# Patient Record
Sex: Female | Born: 1995 | Race: White | Hispanic: No | Smoking: Former smoker
Health system: Southern US, Community
[De-identification: ages and names within clinical notes are randomized; demographics above are authoritative.]

## PROBLEM LIST (undated history)

## (undated) DIAGNOSIS — Z30431 Encounter for routine checking of intrauterine contraceptive device: Secondary | ICD-10-CM

## (undated) DIAGNOSIS — Z30430 Encounter for insertion of intrauterine contraceptive device: Secondary | ICD-10-CM

## (undated) DIAGNOSIS — F32A Depression, unspecified: Secondary | ICD-10-CM

## (undated) DIAGNOSIS — O039 Complete or unspecified spontaneous abortion without complication: Secondary | ICD-10-CM

## (undated) DIAGNOSIS — Z3041 Encounter for surveillance of contraceptive pills: Secondary | ICD-10-CM

## (undated) DIAGNOSIS — Z309 Encounter for contraceptive management, unspecified: Secondary | ICD-10-CM

## (undated) DIAGNOSIS — R42 Dizziness and giddiness: Secondary | ICD-10-CM

## (undated) DIAGNOSIS — Z8759 Personal history of other complications of pregnancy, childbirth and the puerperium: Secondary | ICD-10-CM

## (undated) DIAGNOSIS — Z3491 Encounter for supervision of normal pregnancy, unspecified, first trimester: Secondary | ICD-10-CM

## (undated) DIAGNOSIS — F988 Other specified behavioral and emotional disorders with onset usually occurring in childhood and adolescence: Secondary | ICD-10-CM

## (undated) DIAGNOSIS — F329 Major depressive disorder, single episode, unspecified: Secondary | ICD-10-CM

## (undated) DIAGNOSIS — B9689 Other specified bacterial agents as the cause of diseases classified elsewhere: Secondary | ICD-10-CM

## (undated) DIAGNOSIS — N926 Irregular menstruation, unspecified: Secondary | ICD-10-CM

## (undated) DIAGNOSIS — N760 Acute vaginitis: Principal | ICD-10-CM

## (undated) HISTORY — DX: Encounter for contraceptive management, unspecified: Z30.9

## (undated) HISTORY — DX: Encounter for insertion of intrauterine contraceptive device: Z30.430

## (undated) HISTORY — DX: Encounter for supervision of normal pregnancy, unspecified, first trimester: Z34.91

## (undated) HISTORY — DX: Encounter for surveillance of contraceptive pills: Z30.41

## (undated) HISTORY — DX: Other specified bacterial agents as the cause of diseases classified elsewhere: B96.89

## (undated) HISTORY — DX: Encounter for routine checking of intrauterine contraceptive device: Z30.431

## (undated) HISTORY — DX: Complete or unspecified spontaneous abortion without complication: O03.9

## (undated) HISTORY — DX: Acute vaginitis: N76.0

## (undated) HISTORY — DX: Irregular menstruation, unspecified: N92.6

## (undated) HISTORY — DX: Personal history of other complications of pregnancy, childbirth and the puerperium: Z87.59

## (undated) HISTORY — DX: Dizziness and giddiness: R42

## (undated) HISTORY — DX: Other specified behavioral and emotional disorders with onset usually occurring in childhood and adolescence: F98.8

## (undated) HISTORY — DX: Depression, unspecified: F32.A

## (undated) HISTORY — PX: COLONOSCOPY: SHX174

---
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
## 2013-01-19 ENCOUNTER — Ambulatory Visit: Payer: Medicaid Other | Admitting: Physician Assistant

## 2013-01-27 ENCOUNTER — Ambulatory Visit: Payer: Medicaid Other | Admitting: Physician Assistant

## 2013-02-01 ENCOUNTER — Encounter: Payer: Self-pay | Admitting: Physician Assistant

## 2013-02-01 ENCOUNTER — Ambulatory Visit (INDEPENDENT_AMBULATORY_CARE_PROVIDER_SITE_OTHER): Payer: Medicaid Other | Admitting: Physician Assistant

## 2013-02-01 VITALS — BP 114/70 | HR 84 | Temp 97.9°F | Resp 20

## 2013-02-01 DIAGNOSIS — J309 Allergic rhinitis, unspecified: Secondary | ICD-10-CM

## 2013-02-01 DIAGNOSIS — Z87898 Personal history of other specified conditions: Secondary | ICD-10-CM

## 2013-02-01 DIAGNOSIS — F988 Other specified behavioral and emotional disorders with onset usually occurring in childhood and adolescence: Secondary | ICD-10-CM

## 2013-02-01 DIAGNOSIS — K219 Gastro-esophageal reflux disease without esophagitis: Secondary | ICD-10-CM

## 2013-02-01 DIAGNOSIS — Z0000 Encounter for general adult medical examination without abnormal findings: Secondary | ICD-10-CM

## 2013-02-01 MED ORDER — OMEPRAZOLE 20 MG PO CPDR: 20.0000 mg | DELAYED_RELEASE_CAPSULE | Freq: Every day | ORAL | Status: DC

## 2013-02-01 NOTE — Progress Notes (Signed)
Patient ID: Nicole [name] MRN: [id number], DOB: 05/21/1996, 17 y.o. Date of Encounter: @DATE @  Chief Complaint:  Chief Complaint  Patient presents with  . new pt est care    HPI: 17 y.o. year old female  presents as new patient for WCC/CPE. Here with her mother who is deaf and uses sign language.  Saw Dr. Stephania Fragmin in past but here to establish care here. Also sees a Gyn for Gyn care. Has no real complaints today.Marland Kitchen   Has h/o mild GERD. Uses omeprazole prn. Does not need daily but in past other OTC meds did not relieve her symptoms. Uses this prn and it relieves her symtoms, works well.   H/O ADD: Was on med for this when in school. Graduated HS 2013. Not working, not in school. Says if she goes back to college, then will need to restart med but for now, she does not need med. Currently not employed either. Lives with her parents.   Uses Rx Meclizine prn "motion sickness" -Would use this prn- if riding on curvy roads, etc. Says this problem has gotten better as she has gotten older. Rarely needs the medication any more.   Seasonal Allergies: Uses OTC meds. This controls her symptoms.    Past Medical History  Diagnosis Date  . ADD (attention deficit disorder)   . Vertigo      Home Meds: See attached medication section for current medication list. Any medications entered into computer today will not appear on this note's list. The medications listed below were entered prior to today. Current Outpatient Prescriptions on File Prior to Visit  Medication Sig Dispense Refill  . levonorgestrel-ethinyl estradiol (LEVONEST) tablet Take 1 tablet by mouth daily.  1 Package  12   No current facility-administered medications on file prior to visit.    Allergies:  Allergies  Allergen Reactions  . Poison Ivy Extract (Extract Of Poison [place])     History   Social History  . Marital Status: Single    Spouse Name: N/A    Number of Children: N/A  . Years of Education: N/A   Occupational  History  . Not on file.   Social History Main Topics  . Smoking status: Former Smoker -- .2 years  . Smokeless tobacco: Never Used  . Alcohol Use: Yes     Comment: occ.  . Drug Use: No  . Sexually Active: Yes    Birth Control/ Protection: Pill   Other Topics Concern  . Not on file   Social History Narrative  . No narrative on file    Family History  Problem Relation Age of Onset  . Glaucoma Maternal Grandmother   . Hypertension Maternal Grandmother   . Deafness Mother   . Arthritis Mother      Review of Systems:  See HPI for pertinent ROS. All other ROS negative.    Physical Exam: Blood pressure 114/70, pulse 84, temperature 97.9 F (36.6 C), temperature source Oral, resp. rate 20, last menstrual period 01/11/2013., There is no weight on file to calculate BMI. General:WNWD WF.  Appears in no acute distress. Head: Normocephalic, atraumatic, eyes without discharge, sclera non-icteric, nares are without discharge. Bilateral auditory canals clear, TM's are without perforation, pearly grey and translucent with reflective cone of light bilaterally. Oral cavity moist, posterior pharynx without exudate, erythema, peritonsillar abscess, or post nasal drip.  Neck: Supple. No thyromegaly. No lymphadenopathy. Lungs: Clear bilaterally to auscultation without wheezes, rales, or rhonchi. Breathing is unlabored. Heart: RRR with S1  S2. No murmurs, rubs, or gallops. Abdomen: Soft, non-tender, non-distended with normoactive bowel sounds. No hepatomegaly. No rebound/guarding. No obvious abdominal masses. Musculoskeletal:  Strength and tone normal for age. Extremities/Skin: Warm and dry. No clubbing or cyanosis. No edema. No rashes or suspicious lesions. Neuro: Alert and oriented X 3. Moves all extremities spontaneously. Gait is normal. CNII-XII grossly in tact. Psych:  Responds to questions appropriately with a normal affect.     ASSESSMENT AND PLAN:  17 y.o. year old female with  1.  Visit for preventive health examination Immunizations UTD.  No labs indicated at this age. Asymptomatic.   2. GERD (gastroesophageal reflux disease) To cont med PRN. Avoid spicy foods, etc. Avoid eating, drinking 3 hours prior to going to bed.  - omeprazole (PRILOSEC) 20 MG capsule; Take 1 capsule (20 mg total) by mouth daily.  Dispense: 30 capsule; Refill: 11  3. Allergic rhinitis Cont OTC med prn.   4. ADD (attention deficit disorder) Does not need treatment now.   5. History of motion sickness Cont meclizine prn.   ROV 1 year, sooner if needed.   [address] [place], [place], [facility] 02/01/2013 12:14 PM

## 2013-03-04 ENCOUNTER — Ambulatory Visit (INDEPENDENT_AMBULATORY_CARE_PROVIDER_SITE_OTHER): Payer: Medicaid Other | Admitting: Family Medicine

## 2013-03-04 DIAGNOSIS — Z111 Encounter for screening for respiratory tuberculosis: Secondary | ICD-10-CM

## 2013-03-07 ENCOUNTER — Ambulatory Visit (INDEPENDENT_AMBULATORY_CARE_PROVIDER_SITE_OTHER): Payer: Medicaid Other | Admitting: *Deleted

## 2013-03-08 LAB — TB SKIN TEST
Induration: 0 mm
TB Skin Test: NEGATIVE

## 2013-03-09 ENCOUNTER — Ambulatory Visit: Payer: Medicaid Other | Admitting: Physician Assistant

## 2013-03-18 ENCOUNTER — Ambulatory Visit (INDEPENDENT_AMBULATORY_CARE_PROVIDER_SITE_OTHER): Payer: Medicaid Other | Admitting: Adult Health

## 2013-03-18 ENCOUNTER — Encounter: Payer: Self-pay | Admitting: Adult Health

## 2013-03-18 VITALS — BP 128/84 | Ht 67.0 in | Wt 187.5 lb

## 2013-03-18 DIAGNOSIS — A499 Bacterial infection, unspecified: Secondary | ICD-10-CM

## 2013-03-18 DIAGNOSIS — B9689 Other specified bacterial agents as the cause of diseases classified elsewhere: Secondary | ICD-10-CM

## 2013-03-18 DIAGNOSIS — N898 Other specified noninflammatory disorders of vagina: Secondary | ICD-10-CM

## 2013-03-18 DIAGNOSIS — N760 Acute vaginitis: Secondary | ICD-10-CM

## 2013-03-18 HISTORY — DX: Other specified bacterial agents as the cause of diseases classified elsewhere: B96.89

## 2013-03-18 HISTORY — DX: Other specified bacterial agents as the cause of diseases classified elsewhere: N76.0

## 2013-03-18 LAB — POCT WET PREP (WET MOUNT): Trichomonas Wet Prep HPF POC: NEGATIVE

## 2013-03-18 MED ORDER — METRONIDAZOLE 500 MG PO TABS: 500.0000 mg | ORAL_TABLET | Freq: Two times a day (BID) | ORAL | Status: DC

## 2013-03-18 NOTE — Patient Instructions (Addendum)
Bacterial Vaginosis Bacterial vaginosis (BV) is a vaginal infection where the normal balance of bacteria in the vagina is disrupted. The normal balance is then replaced by an overgrowth of certain bacteria. There are several different kinds of bacteria that can cause BV. BV is the most common vaginal infection in women of childbearing age. CAUSES   The cause of BV is not fully understood. BV develops when there is an increase or imbalance of harmful bacteria.  Some activities or behaviors can upset the normal balance of bacteria in the vagina and put women at increased risk including:  Having a new sex partner or multiple sex partners.  Douching.  Using an intrauterine device (IUD) for contraception.  It is not clear what role sexual activity plays in the development of BV. However, women that have never had sexual intercourse are rarely infected with BV. Women do not get BV from toilet seats, bedding, swimming pools or from touching objects around them.  SYMPTOMS   Grey vaginal discharge.  A fish-like odor with discharge, especially after sexual intercourse.  Itching or burning of the vagina and vulva.  Burning or pain with urination.  Some women have no signs or symptoms at all. DIAGNOSIS  Your caregiver must examine the vagina for signs of BV. Your caregiver will perform lab tests and look at the sample of vaginal fluid through a microscope. They will look for bacteria and abnormal cells (clue cells), a pH test higher than 4.5, and a positive amine test all associated with BV.  RISKS AND COMPLICATIONS   Pelvic inflammatory disease (PID).  Infections following gynecology surgery.  Developing HIV.  Developing herpes virus. TREATMENT  Sometimes BV will clear up without treatment. However, all women with symptoms of BV should be treated to avoid complications, especially if gynecology surgery is planned. Female partners generally do not need to be treated. However, BV may spread  between female sex partners so treatment is helpful in preventing a recurrence of BV.   BV may be treated with antibiotics. The antibiotics come in either pill or vaginal cream forms. Either can be used with nonpregnant or pregnant women, but the recommended dosages differ. These antibiotics are not harmful to the baby.  BV can recur after treatment. If this happens, a second round of antibiotics will often be prescribed.  Treatment is important for pregnant women. If not treated, BV can cause a premature delivery, especially for a pregnant woman who had a premature birth in the past. All pregnant women who have symptoms of BV should be checked and treated.  For chronic reoccurrence of BV, treatment with a type of prescribed gel vaginally twice a week is helpful. HOME CARE INSTRUCTIONS   Finish all medication as directed by your caregiver.  Do not have sex until treatment is completed.  Tell your sexual partner that you have a vaginal infection. They should see their caregiver and be treated if they have problems, such as a mild rash or itching.  Practice safe sex. Use condoms. Only have 1 sex partner. PREVENTION  Basic prevention steps can help reduce the risk of upsetting the natural balance of bacteria in the vagina and developing BV:  Do not have sexual intercourse (be abstinent).  Do not douche.  Use all of the medicine prescribed for treatment of BV, even if the signs and symptoms go away.  Tell your sex partner if you have BV. That way, they can be treated, if needed, to prevent reoccurrence. SEEK MEDICAL CARE IF:     Your symptoms are not improving after 3 days of treatment.  You have increased discharge, pain, or fever. MAKE SURE YOU:   Understand these instructions.  Will watch your condition.  Will get help right away if you are not doing well or get worse. FOR MORE INFORMATION  Division of STD Prevention (DSTDP), Centers for Disease Control and Prevention:  www.cdc.gov/std American Social Health Association (ASHA): www.ashastd.org  Document Released: 08/25/2005 Document Revised: 11/17/2011 Document Reviewed: 02/15/2009 ExitCare Patient Information 2014 [employer]. Take flagyl no alcohol  Follow up prn 

## 2013-03-18 NOTE — Progress Notes (Signed)
Subjective:     Patient ID: Nicole [name], female   DOB: 1996/02/20, 17 y.o.   MRN: [id number]  HPI Nicole [name] is in today complaining of feeling warm internally, it was better with her period.She had her urine checked at PCP and it was fine.She does have sex with condoms and she is on the pill.She may not start her period at the same place in the pack but they come each month.She takes the generic for Triphasil, discussed changing to monophasic pill if desired, if the period starting irregularly bothers her. Will continue the same for now.Mom with pt.  Review of Systems Positives in HPI Reviewed past medical,surgical, social and family history. Reviewed medications and allergies.     Objective:   Physical Exam BP 128/84  Ht 5\' 7"  (1.702 m)  Wt 187 lb 8 oz (85.049 kg)  BMI 29.36 kg/m2  LMP 03/10/2013   Skin warm and dry.Pelvic: external genitalia is normal in appearance, vagina: white discharge with slight odor, cervix:smooth with negative CMT, uterus: normal size, shape and contour, non tender, no masses felt, adnexa: no masses or tenderness noted. Wet prep: + for clue cells and rare +WBCs.  Assessment:      Vaginal discharge BV    Plan:      Rx flagyl 500 mg 1 bid x 7 days, no alcohol Follow up prn

## 2013-06-10 ENCOUNTER — Ambulatory Visit (INDEPENDENT_AMBULATORY_CARE_PROVIDER_SITE_OTHER): Payer: Medicaid Other | Admitting: Family Medicine

## 2013-06-10 DIAGNOSIS — Z23 Encounter for immunization: Secondary | ICD-10-CM

## 2013-06-30 ENCOUNTER — Encounter: Payer: Self-pay | Admitting: Physician Assistant

## 2013-06-30 ENCOUNTER — Ambulatory Visit (INDEPENDENT_AMBULATORY_CARE_PROVIDER_SITE_OTHER): Payer: Medicaid Other | Admitting: Physician Assistant

## 2013-06-30 VITALS — BP 120/90 | HR 88 | Temp 98.6°F | Resp 18 | Wt 193.0 lb

## 2013-06-30 DIAGNOSIS — R5383 Other fatigue: Secondary | ICD-10-CM

## 2013-06-30 DIAGNOSIS — N926 Irregular menstruation, unspecified: Secondary | ICD-10-CM

## 2013-06-30 DIAGNOSIS — R109 Unspecified abdominal pain: Secondary | ICD-10-CM

## 2013-06-30 DIAGNOSIS — R350 Frequency of micturition: Secondary | ICD-10-CM

## 2013-06-30 DIAGNOSIS — R5381 Other malaise: Secondary | ICD-10-CM

## 2013-06-30 DIAGNOSIS — R635 Abnormal weight gain: Secondary | ICD-10-CM

## 2013-06-30 LAB — COMPLETE METABOLIC PANEL WITHOUT GFR
ALT: 17 U/L (ref 0–35)
AST: 15 U/L (ref 0–37)
Albumin: 4.1 g/dL (ref 3.5–5.2)
Alkaline Phosphatase: 74 U/L (ref 39–117)
BUN: 10 mg/dL (ref 6–23)
CO2: 23 meq/L (ref 19–32)
Calcium: 9.6 mg/dL (ref 8.4–10.5)
Chloride: 105 meq/L (ref 96–112)
Creat: 0.73 mg/dL (ref 0.50–1.10)
GFR, Est African American: >89 mL/min
GFR, Est Non African American: >89 mL/min
Glucose, Bld: 94 mg/dL (ref 70–99)
Potassium: 5 meq/L (ref 3.5–5.3)
Sodium: 140 meq/L (ref 135–145)
Total Bilirubin: 0.2 mg/dL — ABNORMAL LOW (ref 0.3–1.2)
Total Protein: 6.7 g/dL (ref 6.0–8.3)

## 2013-06-30 LAB — CBC WITH DIFFERENTIAL/PLATELET
Basophils Relative: 0 % (ref 0–1)
Eosinophils Absolute: 0.1 10*3/uL (ref 0.0–0.7)
Eosinophils Relative: 1 % (ref 0–5)
HCT: 39.4 % (ref 36.0–46.0)
Hemoglobin: 13.2 g/dL (ref 12.0–15.0)
Lymphocytes Relative: 41 % (ref 12–46)
Lymphs Abs: 3.3 10*3/uL (ref 0.7–4.0)
MCH: 28.9 pg (ref 26.0–34.0)
MCV: 86.2 fL (ref 78.0–100.0)
Monocytes Absolute: 0.6 10*3/uL (ref 0.1–1.0)
Monocytes Relative: 7 % (ref 3–12)
Neutro Abs: 4 10*3/uL (ref 1.7–7.7)
Neutrophils Relative %: 51 % (ref 43–77)
Platelets: 239 10*3/uL (ref 150–400)
RBC: 4.57 MIL/uL (ref 3.87–5.11)
WBC: 8 10*3/uL (ref 4.0–10.5)

## 2013-06-30 LAB — TSH: TSH: 1.738 u[IU]/mL (ref 0.350–4.500)

## 2013-06-30 LAB — URINALYSIS, MICROSCOPIC ONLY: Crystals: NONE SEEN

## 2013-06-30 LAB — URINALYSIS, ROUTINE W REFLEX MICROSCOPIC
Glucose, UA: NEGATIVE mg/dL
Ketones, ur: NEGATIVE mg/dL
Leukocytes, UA: NEGATIVE
Nitrite: NEGATIVE
Protein, ur: NEGATIVE mg/dL
Specific Gravity, Urine: >1.03 — ABNORMAL HIGH (ref 1.005–1.030)
Urobilinogen, UA: 0.2 mg/dL (ref 0.0–1.0)
pH: 6 (ref 5.0–8.0)

## 2013-06-30 LAB — PREGNANCY, URINE: Preg Test, Ur: NEGATIVE

## 2013-06-30 NOTE — Progress Notes (Signed)
Patient ID: Nicole [name] MRN: [id number], DOB: 08-06-1996, 17 y.o. Date of Encounter: @DATE @  Chief Complaint:  Chief Complaint  Patient presents with  . c/o stomach pains    across mid section, spotting, urinary freq, ? constipation, wt gain    HPI: 17 y.o. year old white female  presents with the following complaints:  She states that she is on birth control pills. States that she occasionally does accidentally skipped a pill. Says that one month ago she had a day where she saw light spotting just one time when she wiped when using the bathroom. She did understand why she had that spotting. She did not think she had missed any pills around that time. Now says that with this current  Cycle, she still hasn't started to really bleed. Says that with her pills she usually starts her period on that Wednesday. She should have started yesterday. However today is Thursday and she still is there it having very light spotting. Says that her periods usually start off very heavy. She is wondering why this is different. She does report that she missed taking 2 pills during this pack.  She also reports that she has diffuse pains across her entire abdomen all the time. She does not think she has problems with constipation. She says that she really does not have to strain to have a bowel movement and says that her BMs really are not overly hard.  As well she says that she recently feels tired and sleepy all the time. As well she is frustrated because she is having weight gain. Says that she is needing the same doing the same amount of activity but is gaining weight and she does understand this.  She later tells me that she has arty seen her GYN regarding some of these issues. Says that she was told she had a "slight hormone imbalance". Was prescribed medication but says that it caused her to be sick on her stomach and vomit so she has stopped that medication.   Past Medical History  Diagnosis Date  .  ADD (attention deficit disorder)   . Vertigo   . BV (bacterial vaginosis) 03/18/2013    Treat ed with flagyl     Home Meds: See attached medication section for current medication list. Any medications entered into computer today will not appear on this note's list. The medications listed below were entered prior to today. Current Outpatient Prescriptions on File Prior to Visit  Medication Sig Dispense Refill  . Aspirin-Acetaminophen-Caffeine (EXCEDRIN MIGRAINE PO) Take by mouth as needed.      . cetirizine (ZYRTEC) 10 MG tablet Take 10 mg by mouth at bedtime.      Marland Kitchen ibuprofen (ADVIL,MOTRIN) 200 MG tablet Take 200 mg by mouth as needed for pain.      Marland Kitchen levonorgestrel-ethinyl estradiol (LEVONEST) tablet Take 1 tablet by mouth daily.  1 Package  12  . meclizine (ANTIVERT) 25 MG tablet Take 25 mg by mouth 3 (three) times daily as needed.      . Multiple Vitamin (MULTIVITAMIN) tablet Take 1 tablet by mouth daily.      Marland Kitchen amphetamine-dextroamphetamine (ADDERALL XR) 25 MG 24 hr capsule Take 25 mg by mouth every morning. Takes only when in school      . omeprazole (PRILOSEC) 20 MG capsule Take 1 capsule (20 mg total) by mouth daily.  30 capsule  11   No current facility-administered medications on file prior to visit.    Allergies:  Allergies  Allergen Reactions  . Poison Ivy Extract [employer] Of Poison Ivy]     History   Social History  . Marital Status: Single    Spouse Name: N/A    Number of Children: N/A  . Years of Education: N/A   Occupational History  . Not on file.   Social History Main Topics  . Smoking status: Former Smoker -- .2 years    Types: Cigarettes  . Smokeless tobacco: Never Used  . Alcohol Use: Yes     Comment: occ.  . Drug Use: No  . Sexual Activity: Yes    Birth Control/ Protection: Pill   Other Topics Concern  . Not on file   Social History Narrative  . No narrative on file    Family History  Problem Relation Age of Onset  . Glaucoma Maternal  Grandmother   . Hypertension Maternal Grandmother   . Deafness Mother   . Arthritis Mother      Review of Systems:  See HPI for pertinent ROS. All other ROS negative.    Physical Exam: Blood pressure 120/90, pulse 88, temperature 98.6 F (37 C), temperature source Oral, resp. rate 18, weight 193 lb (87.544 kg)., Body mass index is 30.22 kg/(m^2). General: Mild obese white female .Appears in no acute distress. Neck: Supple. No thyromegaly. No lymphadenopathy. Lungs: Clear bilaterally to auscultation without wheezes, rales, or rhonchi. Breathing is unlabored. Heart: RRR with S1 S2. No murmurs, rubs, or gallops. Abdomen: Soft, non-tender, non-distended with normoactive bowel sounds. No hepatomegaly. No rebound/guarding. No obvious abdominal masses. She reports very mild discomfort with palpation around the peri-Umbilical area. Says "that is always sore there." No other area of tenderness with firm palpation throughout. Musculoskeletal:  Strength and tone normal for age. Extremities/Skin: Warm and dry. No clubbing or cyanosis. No edema. No rashes or suspicious lesions. Neuro: Alert and oriented X 3. Moves all extremities spontaneously. Gait is normal. CNII-XII grossly in tact. Psych:  Responds to questions appropriately with a normal affect.   Results for orders placed in visit on 06/30/13  URINALYSIS, ROUTINE W REFLEX MICROSCOPIC      Result Value Range   Color, Urine YELLOW  YELLOW   APPearance CLEAR  CLEAR   Specific Gravity, Urine >1.030 (*) 1.005 - 1.030   pH 6.0  5.0 - 8.0   Glucose, UA NEG  NEG mg/dL   Bilirubin Urine SMALL (*) NEG   Ketones, ur NEG  NEG mg/dL   Hgb urine dipstick LARGE (*) NEG   Protein, ur NEG  NEG mg/dL   Urobilinogen, UA 0.2  0.0 - 1.0 mg/dL   Nitrite NEG  NEG   Leukocytes, UA NEG  NEG  PREGNANCY, URINE      Result Value Range   Preg Test, Ur NEG    URINALYSIS, MICROSCOPIC ONLY      Result Value Range   Squamous Epithelial / LPF FEW  RARE   Crystals  NONE SEEN  NONE SEEN   Casts NONE SEEN  NONE SEEN   WBC, UA 0-2  <3 WBC/hpf   RBC / HPF 0-2  <3 RBC/hpf   Bacteria, UA MANY (*) RARE   Urine-Other SM MUCUS       ASSESSMENT AND PLAN:  17 y.o. year old female with  1. Fatigue - CBC with Differential - COMPLETE METABOLIC PANEL WITH GFR - TSH  2. Weight gain - TSH  3. Abdominal pain, unspecified site  4. Frequency of urination - Urinalysis, Routine w reflex  microscopic - Pregnancy, urine  5. Irregular menses - Urinalysis, Routine w reflex microscopic - Pregnancy, urine - TSH  I think some of her issues are secondary to missing some of her birth control pills. Then causing  imbalance in hormones. Her diffuse abdominal pain it seems secondary to possible constipation.  If her thyroid and labs are normal her fatigue may be secondary to lack of exercise. If labs are normal then I would recommend improvement in her diet and increase exercise and compliance with birth control pills.  Signed, [address] [place], [place], [facility] 06/30/2013 10:26 AM

## 2013-07-04 ENCOUNTER — Telehealth: Payer: Self-pay | Admitting: Physician Assistant

## 2013-07-04 NOTE — Telephone Encounter (Signed)
Patient is calling for her results on her labs.

## 2013-07-04 NOTE — Telephone Encounter (Signed)
Pt was called see result notes.

## 2013-07-27 ENCOUNTER — Ambulatory Visit (INDEPENDENT_AMBULATORY_CARE_PROVIDER_SITE_OTHER): Payer: Medicaid Other | Admitting: Adult Health

## 2013-07-27 ENCOUNTER — Encounter: Payer: Self-pay | Admitting: Adult Health

## 2013-07-27 ENCOUNTER — Other Ambulatory Visit (HOSPITAL_COMMUNITY)
Admission: RE | Admit: 2013-07-27 | Discharge: 2013-07-27 | Disposition: A | Discharge status: Home / Self Care | Payer: Medicaid Other | Source: Ambulatory Visit | Attending: Adult Health | Admitting: Adult Health

## 2013-07-27 VITALS — BP 110/78 | HR 78 | Ht 65.5 in | Wt 194.0 lb

## 2013-07-27 DIAGNOSIS — Z01419 Encounter for gynecological examination (general) (routine) without abnormal findings: Secondary | ICD-10-CM | POA: Insufficient documentation

## 2013-07-27 DIAGNOSIS — Z113 Encounter for screening for infections with a predominantly sexual mode of transmission: Secondary | ICD-10-CM | POA: Insufficient documentation

## 2013-07-27 DIAGNOSIS — Z3049 Encounter for surveillance of other contraceptives: Secondary | ICD-10-CM

## 2013-07-27 DIAGNOSIS — Z309 Encounter for contraceptive management, unspecified: Secondary | ICD-10-CM

## 2013-07-27 LAB — RPR

## 2013-07-27 MED ORDER — NORGESTIMATE-ETH ESTRADIOL 0.25-35 MG-MCG PO TABS: 1.0000 | ORAL_TABLET | Freq: Every day | ORAL | Status: DC

## 2013-07-27 NOTE — Patient Instructions (Signed)
Physical in 1 year Finish current pack of pill and start sprintec Use condoms Follow labs in 48 hours

## 2013-07-27 NOTE — Progress Notes (Signed)
Patient ID: Nicole [name], female   DOB: 1996-04-23, 17 y.o.   MRN: [id number] History of Present Illness: Nicole [name] is a 17 year old white female in to discuss changing OCs, but has family planning medicaid and has to have physical and pap first.She has had some low pelvic pain at times, she was seen at PCP and treated for constipation, but still has about 3 weeks before period starts.   Current Medications, Allergies, Past Medical History, Past Surgical History, Family History and Social History were reviewed in [employer] record.     Review of Systems: Patient denies any headaches, blurred vision, shortness of breath, chest pain,  problems with bowel movements, urination, or intercourse. No joint pain or mood changes, see HPI for positives.    Physical Exam:BP 110/78  Pulse 78  Ht 5' 5.5" (1.664 m)  Wt 194 lb (87.998 kg)  BMI 31.78 kg/m2  LMP 06/26/2013 General:  Well developed, well nourished, no acute distress Skin:  Warm and dry Neck:  Midline trachea, normal thyroid Lungs; Clear to auscultation bilaterally Breast:  No dominant palpable mass, retraction, or nipple discharge Cardiovascular: Regular rate and rhythm Abdomen:  Soft, non tender, no hepatosplenomegaly Pelvic:  External genitalia is normal in appearance.  The vagina is normal in appearance, scant white normal appearing discharge without odor.. The cervix is smooth and pap performed with GC/CHL.Marland Kitchen  Uterus is felt to be normal size, shape, and contour.  Noadnexal masses, mild tenderness noted RLQ. Extremities:  No swelling or varicosities noted Psych:  No mood changes, alert and cooperative, seems happy   Impression: Yearly gyn exam-family planning medicaid Contraceptive management STD screening   Plan: Finish current pills and then start sprintec,Rx sprintec disp 1 pack refill x 11 Use condoms Follow up labs in 48 hours Physical in 1 year Call if not better after 3 months of sprintec Check  HIV,RPR,HSV2, HEPT B&C

## 2013-07-28 LAB — HEPATITIS B SURFACE ANTIGEN: Hepatitis B Surface Ag: NEGATIVE

## 2013-07-28 LAB — HSV 2 ANTIBODY, IGG: HSV 2 Glycoprotein G Ab, IgG: 0.3 IV

## 2013-07-28 LAB — HIV ANTIBODY (ROUTINE TESTING W REFLEX): HIV: NONREACTIVE

## 2013-07-28 LAB — HEPATITIS C ANTIBODY: HCV Ab: NEGATIVE

## 2013-10-26 ENCOUNTER — Ambulatory Visit (INDEPENDENT_AMBULATORY_CARE_PROVIDER_SITE_OTHER): Payer: Medicaid Other | Admitting: Adult Health

## 2013-10-26 ENCOUNTER — Encounter: Payer: Self-pay | Admitting: Adult Health

## 2013-10-26 VITALS — BP 118/78 | Ht 67.0 in | Wt 187.0 lb

## 2013-10-26 DIAGNOSIS — Z3049 Encounter for surveillance of other contraceptives: Secondary | ICD-10-CM

## 2013-10-26 DIAGNOSIS — Z309 Encounter for contraceptive management, unspecified: Secondary | ICD-10-CM

## 2013-10-26 NOTE — Progress Notes (Signed)
Subjective:     Patient ID: Loletta ParishKatie M Allen, female   DOB: [birth year], 18 y.o.   MRN: [id number]  HPI Florentina AddisonKatie is a 18 year old white female in complaining of having burning like a cut with last period none now and the period was pink then dark brown then red, and she missed starting back on sprintec due to weather, could not get to drug store.  Review of Systems See HPI Reviewed past medical,surgical, social and family history. Reviewed medications and allergies.     Objective:   Physical Exam BP 118/78  Ht 5\' 7"  (1.702 m)  Wt 187 lb (84.823 kg)  BMI 29.28 kg/m2  LMP 10/11/2013   see HPI. Discussed with pt that the period could be different colors, but not sure about the burn to call me if it happens again, could be from finger nail cut or tampon, but since none today will not do exam.  Assessment:       Contraceptive management Plan:     Start pills back today Call if any more burning Use condoms Review handout on contraceptive use

## 2013-10-26 NOTE — Patient Instructions (Signed)
Take pill today and use condoms, call if any more burning Oral Contraception Use Oral contraceptive pills (OCPs) are medicines taken to prevent pregnancy. OCPs work by preventing the ovaries from releasing eggs. The hormones in OCPs also cause the cervical mucus to thicken, preventing the sperm from entering the uterus. The hormones also cause the uterine lining to become thin, not allowing a fertilized egg to attach to the inside of the uterus. OCPs are highly effective when taken exactly as prescribed. However, OCPs do not prevent sexually transmitted diseases (STDs). Safe sex practices, such as using condoms along with an OCP, can help prevent STDs. Before taking OCPs, you may have a physical exam and Pap test. Your health care provider may also order blood tests if necessary. Your health care provider will make sure you are a good candidate for oral contraception. Discuss with your health care provider the possible side effects of the OCP you may be prescribed. When starting an OCP, it can take 2 to 3 months for the body to adjust to the changes in hormone levels in your body.  HOW TO TAKE ORAL CONTRACEPTIVE PILLS Your health care provider may advise you on how to start taking the first cycle of OCPs. Otherwise, you can:   Start on day 1 of your menstrual period. You will not need any backup contraceptive protection with this start time.   Start on the first Sunday after your menstrual period or the day you get your prescription. In these cases, you will need to use backup contraceptive protection for the first week.   Start the pill at any time of your cycle. If you take the pill within 5 days of the start of your period, you are protected against pregnancy right away. In this case, you will not need a backup form of birth control. If you start at any other time of your menstrual cycle, you will need to use another form of birth control for 7 days. If your OCP is the type called a minipill, it will  protect you from pregnancy after taking it for 2 days (48 hours). After you have started taking OCPs:   If you forget to take 1 pill, take it as soon as you remember. Take the next pill at the regular time.   If you miss 2 or more pills, call your health care provider because different pills have different instructions for missed doses. Use backup birth control until your next menstrual period starts.   If you use a 28-day pack that contains inactive pills and you miss 1 of the last 7 pills (pills with no hormones), it will not matter. Throw away the rest of the nonhormone pills and start a new pill pack.  No matter which day you start the OCP, you will always start a new pack on that same day of the week. Have an extra pack of OCPs and a backup contraceptive method available in case you miss some pills or lose your OCP pack.  HOME CARE INSTRUCTIONS   Do not smoke.   Always use a condom to protect against STDs. OCPs do not protect against STDs.   Use a calendar to mark your menstrual period days.   Read the information and directions that came with your OCP. Talk to your health care provider if you have questions.  SEEK MEDICAL CARE IF:   You develop nausea and vomiting.   You have abnormal vaginal discharge or bleeding.   You develop a rash.  You miss your menstrual period.   You are losing your hair.   You need treatment for mood swings or depression.   You get dizzy when taking the OCP.   You develop acne from taking the OCP.   You become pregnant.  SEEK IMMEDIATE MEDICAL CARE IF:   You develop chest pain.   You develop shortness of breath.   You have an uncontrolled or severe headache.   You develop numbness or slurred speech.   You develop visual problems.   You develop pain, redness, and swelling in the legs.  Document Released: 08/14/2011 Document Revised: 04/27/2013 Document Reviewed: 02/13/2013 [facility] Patient Information 2014  [place], [place].

## 2013-11-28 ENCOUNTER — Telehealth: Payer: Self-pay | Admitting: Family Medicine

## 2013-11-28 NOTE — Telephone Encounter (Signed)
rec'd PA request from pharmacy for Omeprazole.  I called patient, she states she only uses this when needed. Some times may only take once a month.  Will try to get PA from Medicaid.  They do not usually approve this when patient takes daily but may for prn use.

## 2013-11-30 NOTE — Telephone Encounter (Signed)
[facility]razole.  [facility] [facility]  Approval # [id number]  Approved for 1 year 11/28/13-11/28/14.  Confirmation [id number]

## 2014-07-21 ENCOUNTER — Ambulatory Visit (INDEPENDENT_AMBULATORY_CARE_PROVIDER_SITE_OTHER): Payer: Medicaid Other | Admitting: *Deleted

## 2014-07-21 DIAGNOSIS — Z23 Encounter for immunization: Secondary | ICD-10-CM

## 2014-08-10 ENCOUNTER — Other Ambulatory Visit: Payer: Self-pay | Admitting: Adult Health

## 2015-02-23 ENCOUNTER — Encounter: Payer: Self-pay | Admitting: Family Medicine

## 2015-02-23 ENCOUNTER — Ambulatory Visit (INDEPENDENT_AMBULATORY_CARE_PROVIDER_SITE_OTHER): Payer: BLUE CROSS/BLUE SHIELD | Admitting: Family Medicine

## 2015-02-23 VITALS — BP 122/64 | HR 78 | Temp 98.2°F | Resp 14 | Ht 66.5 in | Wt 169.0 lb

## 2015-02-23 DIAGNOSIS — F988 Other specified behavioral and emotional disorders with onset usually occurring in childhood and adolescence: Secondary | ICD-10-CM

## 2015-02-23 DIAGNOSIS — F909 Attention-deficit hyperactivity disorder, unspecified type: Secondary | ICD-10-CM

## 2015-02-23 NOTE — Patient Instructions (Signed)
Continue current medications I will write a letter  F/U as needed

## 2015-02-25 NOTE — Progress Notes (Signed)
Patient ID: Nicole [name], female   DOB: 1996/07/20, 19 y.o.   MRN: [id number]   Subjective:    Patient ID: Nicole [name], female    DOB: [birth year], 19 y.o.   MRN: [id number]  Patient presents for [employer]  Pt here for Clearance for the [employer]. She has history of ADHD and this was flagged on her application. She requires a letter stating her current ADHD status. Her mother is here who is deaf and uses ASL- Lyndsi is interperting. Diagnosed with ADHD age, 3 was evaluated by her pediatrician at that time and started on Medications. No history of any other mental illness such as anxiety, depression or bipolar disorder. She was last on Vyvanse in [employer]. She stopped medication her senior year. When she graduated she was off medication. She then entered [employer] school and then went on to [employer] and completed both courses without any medications. 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 She has not had any overactive behavioral issues since her young childhood.     Review Of Systems:  GEN- denies fatigue, fever, weight loss,weakness, recent illness HEENT- denies eye drainage, change in vision, nasal discharge, CVS- denies chest pain, palpitations RESP- denies SOB, cough, wheeze ABD- denies N/V, change in stools, abd pain GU- denies dysuria, hematuria, dribbling, incontinence MSK- denies joint pain, muscle aches, injury Neuro- denies headache, dizziness, syncope, seizure activity       Objective:    BP 122/64 mmHg  Pulse 78  Temp(Src) 98.2 F (36.8 C) (Oral)  Resp 14  Ht 5' 6.5" (1.689 m)  Wt 169 lb (76.658 kg)  BMI 26.87 kg/m2  LMP 02/20/2015 (Approximate) GEN- NAD, alert and oriented x3 CVS- RRR, no murmur RESP-CTAB Psych- normal affect and mood, normal speech, normal thought process         Assessment & Plan:      Problem List Items Addressed This Visit    ADD  (attention deficit disorder) - Primary    History of ADHD asymptomatic for a few years now, off medications for 3 years. She has been able to complete college education and hold a job without any difficulty. She is cleared to participate in any [employer] and does not require any medication. Letter will be written on behalf to her Biomedical engineer.          Note: This dictation was prepared with Dragon dictation along with smaller phrase technology. Any transcriptional errors that result from this process are unintentional.

## 2015-02-25 NOTE — Assessment & Plan Note (Signed)
History of ADHD asymptomatic for a few years now, off medications for 3 years. She has been able to complete college education and hold a job without any difficulty. She is cleared to participate in any [employer] and does not require any medication. Letter will be written on behalf to her Biomedical engineer.

## 2015-02-26 ENCOUNTER — Encounter: Payer: Self-pay | Admitting: Family Medicine

## 2015-02-27 ENCOUNTER — Encounter: Payer: Self-pay | Admitting: Family Medicine

## 2015-02-27 ENCOUNTER — Ambulatory Visit (INDEPENDENT_AMBULATORY_CARE_PROVIDER_SITE_OTHER): Payer: BLUE CROSS/BLUE SHIELD | Admitting: Family Medicine

## 2015-02-27 VITALS — BP 122/66 | HR 70 | Temp 98.5°F | Resp 16 | Ht 66.5 in | Wt 169.0 lb

## 2015-02-27 DIAGNOSIS — R42 Dizziness and giddiness: Secondary | ICD-10-CM | POA: Diagnosis not present

## 2015-02-27 DIAGNOSIS — J0100 Acute maxillary sinusitis, unspecified: Secondary | ICD-10-CM | POA: Diagnosis not present

## 2015-02-27 MED ORDER — AMOXICILLIN 875 MG PO TABS: 875.0000 mg | ORAL_TABLET | Freq: Two times a day (BID) | ORAL | Status: DC

## 2015-02-27 NOTE — Patient Instructions (Signed)
Take zyrtec as prescribed Antibiotics Fluids We will call with lab results

## 2015-02-27 NOTE — Progress Notes (Signed)
Patient ID: Nicole [name], female   DOB: 17-Aug-1996, 19 y.o.   MRN: [id number]   Subjective:    Patient ID: Nicole [name], female    DOB: 22-Jan-1996, 19 y.o.   MRN: [id number]  Patient presents for Dizziness  patient with dizziness and sinus pressure E or popping for the past couple weeks. She's also had some mild cough and congestion. She did not mention this last week and she thought it would go away but when she was exercising the past 2 days she felt dizzy and felt like she was going to vomit. she has not had any emesis. She states the symptoms started after she was cleaning out some moles in her office. She uses bleach without using the mask. She also has seasonal allergies and has not been taking her Zyrtec.  She does have history of some vertigo-like symptoms which she has had on and off in the past and she does use meclizine as needed    Review Of Systems: per above   GEN- denies fatigue, fever, weight loss,weakness, recent illness HEENT- denies eye drainage, change in vision, nasal discharge, CVS- denies chest pain, palpitations RESP- denies SOB, cough, wheeze ABD- denies N/V, change in stools, abd pain GU- denies dysuria, hematuria, dribbling, incontinence MSK- denies joint pain, muscle aches, injury Neuro- denies headache, +dizziness, syncope, seizure activity       Objective:    BP 122/66 mmHg  Pulse 70  Temp(Src) 98.5 F (36.9 C) (Oral)  Resp 16  Ht 5' 6.5" (1.689 m)  Wt 169 lb (76.658 kg)  BMI 26.87 kg/m2  LMP 02/20/2015 (Approximate) GEN- NAD, alert and oriented x3 HEENT- PERRL, EOMI, non injected sclera, pink conjunctiva, MMM, oropharynx clear, TTP left maxillary sinus, clear fluid in left TM, canal clear bilat Neck- Supple, no thyromegaly, no LAD CVS- RRR, no murmur RESP-CTAB Neuro-CNII-XII intact no focal deficits EXT- No edema Pulses- Radial - 2+        Assessment & Plan:      Problem List Items Addressed This Visit    None    Visit Diagnoses     Dizziness and giddiness    -  Primary    Relevant Orders    CBC with Differential/Platelet    Comprehensive metabolic panel    TSH    Acute maxillary sinusitis, recurrence not specified        cover sinusitis, given amox, restart zyrtec. Push fluids, hold off one exercise a couple of days, mold/bleach exposure could also cause some symptoms. Will check basic labs as hopefully leaving for military as well soon    Relevant Medications    amoxicillin (AMOXIL) 875 MG tablet       Note: This dictation was prepared with Dragon dictation along with smaller phrase technology. Any transcriptional errors that result from this process are unintentional.

## 2015-02-28 LAB — COMPREHENSIVE METABOLIC PANEL
ALK PHOS: 61 U/L (ref 39–117)
ALT: 11 U/L (ref 0–35)
AST: 14 U/L (ref 0–37)
Albumin: 3.6 g/dL (ref 3.5–5.2)
BUN: 12 mg/dL (ref 6–23)
CO2: 25 mEq/L (ref 19–32)
Calcium: 9.5 mg/dL (ref 8.4–10.5)
Chloride: 105 mEq/L (ref 96–112)
Creat: 0.77 mg/dL (ref 0.50–1.10)
Glucose, Bld: 77 mg/dL (ref 70–99)
Potassium: 5 mEq/L (ref 3.5–5.3)
Sodium: 140 mEq/L (ref 135–145)
Total Bilirubin: 0.4 mg/dL (ref 0.2–1.2)
Total Protein: 6.3 g/dL (ref 6.0–8.3)

## 2015-02-28 LAB — CBC WITH DIFFERENTIAL/PLATELET
Basophils Absolute: 0 10*3/uL (ref 0.0–0.1)
Basophils Relative: 0 % (ref 0–1)
EOS PCT: 1 % (ref 0–5)
Eosinophils Absolute: 0.1 10*3/uL (ref 0.0–0.7)
HCT: 40.9 % (ref 36.0–46.0)
Hemoglobin: 13.3 g/dL (ref 12.0–15.0)
Lymphocytes Relative: 26 % (ref 12–46)
Lymphs Abs: 2.2 10*3/uL (ref 0.7–4.0)
MCH: 29 pg (ref 26.0–34.0)
MCHC: 32.5 g/dL (ref 30.0–36.0)
MCV: 89.3 fL (ref 78.0–100.0)
MPV: 11.4 fL (ref 8.6–12.4)
Monocytes Absolute: 0.5 10*3/uL (ref 0.1–1.0)
Monocytes Relative: 6 % (ref 3–12)
Neutro Abs: 5.7 10*3/uL (ref 1.7–7.7)
Neutrophils Relative %: 67 % (ref 43–77)
PLATELETS: 224 10*3/uL (ref 150–400)
RBC: 4.58 MIL/uL (ref 3.87–5.11)
RDW: 13.5 % (ref 11.5–15.5)
WBC: 8.5 10*3/uL (ref 4.0–10.5)

## 2015-02-28 LAB — TSH: TSH: 0.61 u[IU]/mL (ref 0.350–4.500)

## 2015-03-02 ENCOUNTER — Telehealth: Payer: Self-pay | Admitting: Family Medicine

## 2015-03-02 ENCOUNTER — Encounter: Payer: Self-pay | Admitting: Family Medicine

## 2015-03-02 ENCOUNTER — Encounter: Payer: Self-pay | Admitting: *Deleted

## 2015-03-02 NOTE — Telephone Encounter (Signed)
Letter done, pt can pick up

## 2015-03-02 NOTE — Telephone Encounter (Signed)
Pt took letter you wrote to [employer].  They said she needs another one but it can not mention the military in the letter.  Would like to pick up Monday.

## 2015-03-05 NOTE — Telephone Encounter (Signed)
Called placed to pt to make aware that letter is up front to be picked up.

## 2015-03-19 ENCOUNTER — Telehealth: Payer: Self-pay | Admitting: Family Medicine

## 2015-03-19 NOTE — Telephone Encounter (Signed)
Again needs new letter for [employer].  Needs to say ADD not ADHD.  Last sentence needs to say  "She does not require any medication and is released from my care"  Understands you will not be back in the office until 7/18.

## 2015-03-26 NOTE — Telephone Encounter (Signed)
Call pt back, as a child the diagnosis was ADHD which is correct, see what it the issue is. Also she is still a patient here therefore is NOT released from my care, if she has the number for who ever this Biomedical engineeravy recruiter is that may be better

## 2015-03-28 ENCOUNTER — Encounter: Payer: Self-pay | Admitting: *Deleted

## 2015-03-28 NOTE — Telephone Encounter (Signed)
Call placed to recruiter to verify.   Letter revised per recruiter's recommendation.   Letter faxed.

## 2015-03-28 NOTE — Telephone Encounter (Signed)
Received call from patient.   Reports that she was contacted by Glade LloydJackie Phillips, [employer] recruiter and was advised taht the Dx of ADHD was correct. Also advised that the last sentence of letter should state that she does not need any medications for ADHD and is released from ca re in regards to ADHD.   Contact information for Recruiter is [phone].

## 2015-03-28 NOTE — Telephone Encounter (Signed)
Okay , can you verify this with the recruiter, and okay to revise the letter again  You can fax to them

## 2015-05-29 ENCOUNTER — Ambulatory Visit (INDEPENDENT_AMBULATORY_CARE_PROVIDER_SITE_OTHER): Payer: BLUE CROSS/BLUE SHIELD | Admitting: Adult Health

## 2015-05-29 ENCOUNTER — Encounter: Payer: Self-pay | Admitting: Adult Health

## 2015-05-29 VITALS — BP 110/78 | HR 84 | Ht 67.0 in | Wt 172.5 lb

## 2015-05-29 DIAGNOSIS — Z309 Encounter for contraceptive management, unspecified: Secondary | ICD-10-CM

## 2015-05-29 DIAGNOSIS — Z3041 Encounter for surveillance of contraceptive pills: Secondary | ICD-10-CM | POA: Diagnosis not present

## 2015-05-29 HISTORY — DX: Encounter for contraceptive management, unspecified: Z30.9

## 2015-05-29 MED ORDER — NORGESTIMATE-ETH ESTRADIOL 0.25-35 MG-MCG PO TABS: ORAL_TABLET | ORAL | Status: DC

## 2015-05-29 NOTE — Patient Instructions (Signed)
Continue OCs Return in 2 week for pap and physical

## 2015-05-29 NOTE — Progress Notes (Signed)
Subjective:     Patient ID: Nicole [name], female   DOB: 12-08-1995, 19 y.o.   MRN: [id number]  HPI Nicole [name] is a 19 year old white female in to get OCs refilled, no complaints.  Review of Systems Patient denies any daily headaches, hearing loss, fatigue, blurred vision, shortness of breath, chest pain, abdominal pain, problems with bowel movements, urination, or intercourse. No joint pain or mood swings.Periods are good.  Reviewed past medical,surgical, social and family history. Reviewed medications and allergies.     Objective:   Physical Exam BP 110/78 mmHg  Pulse 84  Ht  (1.702 m)  Wt 172 lb 8 oz (78.245 kg)  BMI 27.01 kg/m2  LMP 05/09/2015 Skin warm and dry. Neck: mid line trachea, normal thyroid, good ROM, no lymphadenopathy noted. Lungs: clear to ausculation bilaterally. Cardiovascular: regular rate and rhythm.Has bruise right side.    Assessment:     Contraceptive management    Plan:     Refilled mono-linyah x 1 year,take 1 daily, disp 1 pack  Return in 2 weeks for pap and physical

## 2015-06-12 ENCOUNTER — Other Ambulatory Visit: Payer: BLUE CROSS/BLUE SHIELD | Admitting: Adult Health

## 2015-09-04 ENCOUNTER — Ambulatory Visit (INDEPENDENT_AMBULATORY_CARE_PROVIDER_SITE_OTHER): Payer: Self-pay | Admitting: Women's Health

## 2015-09-04 ENCOUNTER — Encounter: Payer: Self-pay | Admitting: Women's Health

## 2015-09-04 VITALS — BP 124/62 | HR 92 | Ht 67.0 in | Wt 173.5 lb

## 2015-09-04 DIAGNOSIS — O3680X0 Pregnancy with inconclusive fetal viability, not applicable or unspecified: Secondary | ICD-10-CM

## 2015-09-04 DIAGNOSIS — Z3490 Encounter for supervision of normal pregnancy, unspecified, unspecified trimester: Secondary | ICD-10-CM

## 2015-09-04 DIAGNOSIS — Z3201 Encounter for pregnancy test, result positive: Secondary | ICD-10-CM

## 2015-09-04 LAB — POCT URINE PREGNANCY: Preg Test, Ur: POSITIVE — AB

## 2015-09-04 NOTE — Progress Notes (Signed)
Patient ID: Nicole [name], female   DOB: [birth year], 19 y.o.   MRN: [id number]   [facility]  Patient name: Nicole [name] MRN [id number]  Date of birth: [birth year]  CC & HPI:  Nicole [name] is a 19 y.o. 381P0000 Caucasian female presenting today for +PT. +breast tenderness, no real nausea. Not taking pnv, wasn't sure she was really pregnant. Has been exercising- ok to continue. Takes excedrine migraine prn- to stop.  No smoking, drinking etoh, or illicit drugs. Patient's last menstrual period was 07/28/2015. which puts EDC @ 8/26, makes her 1452w3d. Patient's last menstrual period was 07/28/2015.  Pertinent History Reviewed:  Medical & Surgical Hx:   Past Medical History  Diagnosis Date  . ADD (attention deficit disorder)   . Vertigo   . BV (bacterial vaginosis) 03/18/2013    Treat ed with flagyl  . Contraceptive management 05/29/2015   History reviewed. No pertinent past surgical history. Medications: Reviewed & Updated - see associated section Social History: Reviewed -  reports that she has never smoked. She has never used smokeless tobacco.  Objective Findings:  Vitals: BP 124/62 mmHg  Pulse 92  Ht 5\' 7"  (1.702 m)  Wt 173 lb 8 oz (78.699 kg)  BMI 27.17 kg/m2  LMP 07/28/2015 Body mass index is 27.17 kg/(m^2).  Physical Examination: General appearance - alert, well appearing, and in no distress  Results for orders placed or performed in visit on 09/04/15 (from the past 24 hour(s))  POCT urine pregnancy   Collection Time: 09/04/15  2:08 PM  Result Value Ref Range   Preg Test, Ur Positive (A) Negative     Assessment & Plan:  A:   ~7552w3d pregnant based on LMP  Pregnant  P:  Begin pnv today  Ok to continue exercising  Discussed warning s/s to report  Gave printed info for n/v, constipation, headaches  Gave pregnancy verification form  Return in about 2 weeks (around 09/18/2015) for new ob u/s (no visit), then 4wks for new ob visit.  Marge DuncansBooker, Nicole [name]  CNM, [facility]NP-BC 09/04/2015 2:23 PM

## 2015-09-04 NOTE — Patient Instructions (Addendum)
Start taking a prenatal vitamin daily (at least 400-89300mcg folic acid total)  Nausea & Vomiting  Have saltine crackers or pretzels by your bed and eat a few bites before you raise your head out of bed in the morning  Eat small frequent meals throughout the day instead of large meals  Drink plenty of fluids throughout the day to stay hydrated, just don't drink a lot of fluids with your meals.  This can make your stomach fill up faster making you feel sick  Do not brush your teeth right after you eat  Products with real ginger are good for nausea, like ginger ale and ginger hard candy Make sure it says made with real ginger!  Sucking on sour candy like lemon heads is also good for nausea  If your prenatal vitamins make you nauseated, take them at night so you will sleep through the nausea  Sea Bands  If you feel like you need medicine for the nausea & vomiting please let us know  If you are unable to keep any fluids or food down please let us know   Constipation  Drink plenty of fluid, preferably water, throughout the day  Eat foods high in fiber such as fruits, vegetables, and grains  Exercise, such as walking, is a good way to keep your bowels regular  Drink warm fluids, especially warm prune juice, or decaf coffee  Eat a 1/2 cup of real oatmeal (not instant), 1/2 cup applesauce, and 1/2-1 cup warm prune juice every day  If needed, you may take Colace (docusate sodium) stool softener once or twice a day to help keep the stool soft. If you are pregnant, wait until you are out of your first trimester (12-14 weeks of pregnancy)  If you still are having problems with constipation, you may take Miralax once daily as needed to help keep your bowels regular.  If you are pregnant, wait until you are out of your first trimester (12-14 weeks of pregnancy)      For Headaches:   Stay well hydrated, drink enough water so that your urine is clear, sometimes if you are dehydrated you  can get headaches  Eat small frequent meals and snacks, sometimes if you are hungry you can get headaches  Sometimes you get headaches during pregnancy from the pregnancy hormones  You can try tylenol (1-2 regular strength 325mg  or 1-2 extra strength 500mg ) as directed on the box. The least amount of medication that works is best.   Cool compresses (cool wet washcloth or ice pack) to area of head that is hurting  You can also try drinking a caffeinated drink to see if this will help Call us if these things aren't helping your headaches   First Trimester of Pregnancy The first trimester of pregnancy is from week 1 until the end of week 12 (months 1 through 3). A week after a sperm fertilizes an egg, the egg will implant on the wall of the uterus. This embryo will begin to develop into a baby. Genes from you and your partner are forming the baby. The female genes determine whether the baby is a boy or a girl. At 6-8 weeks, the eyes and face are formed, and the heartbeat can be seen on ultrasound. At the end of 12 weeks, all the baby's organs are formed.  Now that you are pregnant, you will want to do everything you can to have a healthy baby. Two of the most important things are to get good  prenatal care and to follow your health care provider's instructions. Prenatal care is all the medical care you receive before the baby's birth. This care will help prevent, find, and treat any problems during the pregnancy and childbirth. BODY CHANGES Your body goes through many changes during pregnancy. The changes vary from woman to woman.  18. You may gain or lose a couple of pounds at first. 19. You may feel sick to your stomach (nauseous) and throw up (vomit). If the vomiting is uncontrollable, call your health care provider. 20. You may tire easily. 21. You may develop headaches that can be relieved by medicines approved by your health care provider. 22. You may urinate more often. Painful urination may  mean you have a bladder infection. 23. You may develop heartburn as a result of your pregnancy. 24. You may develop constipation because certain hormones are causing the muscles that push waste through your intestines to slow down. 25. You may develop hemorrhoids or swollen, bulging veins (varicose veins). 26. Your breasts may begin to grow larger and become tender. Your nipples may stick out more, and the tissue that surrounds them (areola) may become darker. 27. Your gums may bleed and may be sensitive to brushing and flossing. 28. Dark spots or blotches (chloasma, mask of pregnancy) may develop on your face. This will likely fade after the baby is born. 29. Your menstrual periods will stop. 30. You may have a loss of appetite. 31. You may develop cravings for certain kinds of food. 32. You may have changes in your emotions from day to day, such as being excited to be pregnant or being concerned that something may go wrong with the pregnancy and baby. 33. You may have more vivid and strange dreams. 34. You may have changes in your hair. These can include thickening of your hair, rapid growth, and changes in texture. Some women also have hair loss during or after pregnancy, or hair that feels dry or thin. Your hair will most likely return to normal after your baby is born. WHAT TO EXPECT AT YOUR PRENATAL VISITS During a routine prenatal visit:  You will be weighed to make sure you and the baby are growing normally.  Your blood pressure will be taken.  Your abdomen will be measured to track your baby's growth.  The fetal heartbeat will be listened to starting around week 10 or 12 of your pregnancy.  Test results from any previous visits will be discussed. Your health care provider may ask you:  How you are feeling.  If you are feeling the baby move.  If you have had any abnormal symptoms, such as leaking fluid, bleeding, severe headaches, or abdominal cramping.  If you are using any  tobacco products, including cigarettes, chewing tobacco, and electronic cigarettes.  If you have any questions. Other tests that may be performed during your first trimester include:  Blood tests to find your blood type and to check for the presence of any previous infections. They will also be used to check for low iron levels (anemia) and Rh antibodies. Later in the pregnancy, blood tests for diabetes will be done along with other tests if problems develop.  Urine tests to check for infections, diabetes, or protein in the urine.  An ultrasound to confirm the proper growth and development of the baby.  An amniocentesis to check for possible genetic problems.  Fetal screens for spina bifida and Down syndrome.  You may need other tests to make sure you and  the baby are doing well.  HIV (human immunodeficiency virus) testing. Routine prenatal testing includes screening for HIV, unless you choose not to have this test. HOME CARE INSTRUCTIONS  Medicines  Follow your health care provider's instructions regarding medicine use. Specific medicines may be either safe or unsafe to take during pregnancy.  Take your prenatal vitamins as directed.  If you develop constipation, try taking a stool softener if your health care provider approves. Diet  Eat regular, well-balanced meals. Choose a variety of foods, such as meat or vegetable-based protein, fish, milk and low-fat dairy products, vegetables, fruits, and whole grain breads and cereals. Your health care provider will help you determine the amount of weight gain that is right for you.  Avoid raw meat and uncooked cheese. These carry germs that can cause birth defects in the baby.  Eating four or five small meals rather than three large meals a day may help relieve nausea and vomiting. If you start to feel nauseous, eating a few soda crackers can be helpful. Drinking liquids between meals instead of during meals also seems to help nausea and  vomiting.  If you develop constipation, eat more high-fiber foods, such as fresh vegetables or fruit and whole grains. Drink enough fluids to keep your urine clear or pale yellow. Activity and Exercise  Exercise only as directed by your health care provider. Exercising will help you:  Control your weight.  Stay in shape.  Be prepared for labor and delivery.  Experiencing pain or cramping in the lower abdomen or low back is a good sign that you should stop exercising. Check with your health care provider before continuing normal exercises.  Try to avoid standing for long periods of time. Move your legs often if you must stand in one place for a long time.  Avoid heavy lifting.  Wear low-heeled shoes, and practice good posture.  You may continue to have sex unless your health care provider directs you otherwise. Relief of Pain or Discomfort  Wear a good support bra for breast tenderness.   Take warm sitz baths to soothe any pain or discomfort caused by hemorrhoids. Use hemorrhoid cream if your health care provider approves.   Rest with your legs elevated if you have leg cramps or low back pain.  If you develop varicose veins in your legs, wear support hose. Elevate your feet for 15 minutes, 3-4 times a day. Limit salt in your diet. Prenatal Care  Schedule your prenatal visits by the twelfth week of pregnancy. They are usually scheduled monthly at first, then more often in the last 2 months before delivery.  Write down your questions. Take them to your prenatal visits.  Keep all your prenatal visits as directed by your health care provider. Safety  Wear your seat belt at all times when driving.  Make a list of emergency phone numbers, including numbers for family, friends, the hospital, and police and fire departments. General Tips  Ask your health care provider for a referral to a local prenatal education class. Begin classes no later than at the beginning of month 6 of  your pregnancy.  Ask for help if you have counseling or nutritional needs during pregnancy. Your health care provider can offer advice or refer you to specialists for help with various needs.  Do not use hot tubs, steam rooms, or saunas.  Do not douche or use tampons or scented sanitary pads.  Do not cross your legs for long periods of time.  Avoid cat  litter boxes and soil used by cats. These carry germs that can cause birth defects in the baby and possibly loss of the fetus by miscarriage or stillbirth.  Avoid all smoking, herbs, alcohol, and medicines not prescribed by your health care provider. Chemicals in these affect the formation and growth of the baby.  Do not use any tobacco products, including cigarettes, chewing tobacco, and electronic cigarettes. If you need help quitting, ask your health care provider. You may receive counseling support and other resources to help you quit.  Schedule a dentist appointment. At home, brush your teeth with a soft toothbrush and be gentle when you floss. SEEK MEDICAL CARE IF:   You have dizziness.  You have mild pelvic cramps, pelvic pressure, or nagging pain in the abdominal area.  You have persistent nausea, vomiting, or diarrhea.  You have a bad smelling vaginal discharge.  You have pain with urination.  You notice increased swelling in your face, hands, legs, or ankles. SEEK IMMEDIATE MEDICAL CARE IF:   You have a fever.  You are leaking fluid from your vagina.  You have spotting or bleeding from your vagina.  You have severe abdominal cramping or pain.  You have rapid weight gain or loss.  You vomit blood or material that looks like coffee grounds.  You are exposed to [place] measles and have never had them.  You are exposed to fifth disease or chickenpox.  You develop a severe headache.  You have shortness of breath.  You have any kind of trauma, such as from a fall or a car accident.   This information is not  intended to replace advice given to you by your health care provider. Make sure you discuss any questions you have with your health care provider.   Document Released: 08/19/2001 Document Revised: 09/15/2014 Document Reviewed: 07/05/2013 Elsevier Interactive Patient Education [employer]

## 2015-09-04 NOTE — Addendum Note (Signed)
Addended by: Cheral MarkerBOOKER, Leelan Rajewski R on: 09/04/2015 02:29 PM   Modules accepted: Orders

## 2015-09-09 NOTE — L&D Delivery Note (Signed)
Patient is 20 y.o. G1P0000 2048w3d admitted IOL for IUGR.   Delivery Note At 12:30 PM a viable and healthy female was delivered via Vaginal, Spontaneous Delivery (Presentation: ROA ).  APGAR: 9, 9.   Placenta status: intact.  Cord: 3 vessel  Without complications. Anesthesia:  epidural Episiotomy: None Lacerations: 1st degree;Perineal;Labial Suture Repair: 2.0 vicryl Est. Blood Loss (mL):  100mL  Mom to postpartum.  Baby to Couplet care / Skin to Skin.  Leland HerElsia J [name] PGY-1 04/29/2016, 1:09 PM      Upon arrival patient was complete and pushing. She pushed with good maternal effort to deliver a healthy baby girl. Baby delivered without difficulty, was noted to have good tone and place on maternal abdomen for oral suctioning, drying and stimulation. Delayed cord clamping performed. Placenta delivered intact with 3V cord. Vaginal canal and perineum was inspected and repaired; hemostatic. Pitocin was started and uterus massaged until bleeding slowed. Counts of sharps, instruments, and lap pads were all correct.   Leland HerElsia J Yoo, DO PGY-1, [facility] 8/22/20171:09 PM   OB FELLOW DELIVERY ATTESTATION  I was gloved and present for the delivery in its entirety, and I agree with the above resident's note.    Ernestina PennaNicholas Cypress Hinkson, MD 5:44 PM

## 2015-09-18 ENCOUNTER — Other Ambulatory Visit: Payer: Self-pay | Admitting: Women's Health

## 2015-09-18 DIAGNOSIS — O3680X0 Pregnancy with inconclusive fetal viability, not applicable or unspecified: Secondary | ICD-10-CM

## 2015-09-19 ENCOUNTER — Ambulatory Visit (INDEPENDENT_AMBULATORY_CARE_PROVIDER_SITE_OTHER): Payer: BLUE CROSS/BLUE SHIELD

## 2015-09-19 DIAGNOSIS — O3680X0 Pregnancy with inconclusive fetal viability, not applicable or unspecified: Secondary | ICD-10-CM | POA: Diagnosis not present

## 2015-09-19 NOTE — Progress Notes (Signed)
US 7+4wks,single IUP pos fht 129 bpm,normal ov's bilat,corpus luteal cyst lt ov 2.1 x 1.8 x 1.7cm,crl 9.115mm

## 2015-09-21 ENCOUNTER — Other Ambulatory Visit: Payer: Medicaid Other

## 2015-10-02 ENCOUNTER — Encounter: Payer: BLUE CROSS/BLUE SHIELD | Admitting: Women's Health

## 2015-10-05 ENCOUNTER — Encounter: Payer: Self-pay | Admitting: Adult Health

## 2015-10-05 ENCOUNTER — Ambulatory Visit (INDEPENDENT_AMBULATORY_CARE_PROVIDER_SITE_OTHER): Payer: BLUE CROSS/BLUE SHIELD | Admitting: Adult Health

## 2015-10-05 ENCOUNTER — Other Ambulatory Visit (HOSPITAL_COMMUNITY)
Admission: RE | Admit: 2015-10-05 | Discharge: 2015-10-05 | Disposition: A | Discharge status: Home / Self Care | Payer: Medicaid Other | Source: Ambulatory Visit | Attending: Adult Health | Admitting: Adult Health

## 2015-10-05 VITALS — BP 122/82 | HR 72 | Wt 174.0 lb

## 2015-10-05 DIAGNOSIS — Z23 Encounter for immunization: Secondary | ICD-10-CM

## 2015-10-05 DIAGNOSIS — Z3491 Encounter for supervision of normal pregnancy, unspecified, first trimester: Secondary | ICD-10-CM

## 2015-10-05 DIAGNOSIS — Z113 Encounter for screening for infections with a predominantly sexual mode of transmission: Secondary | ICD-10-CM | POA: Insufficient documentation

## 2015-10-05 DIAGNOSIS — Z3400 Encounter for supervision of normal first pregnancy, unspecified trimester: Secondary | ICD-10-CM | POA: Insufficient documentation

## 2015-10-05 DIAGNOSIS — Z01419 Encounter for gynecological examination (general) (routine) without abnormal findings: Secondary | ICD-10-CM | POA: Diagnosis not present

## 2015-10-05 DIAGNOSIS — Z3481 Encounter for supervision of other normal pregnancy, first trimester: Secondary | ICD-10-CM | POA: Diagnosis not present

## 2015-10-05 DIAGNOSIS — Z369 Encounter for antenatal screening, unspecified: Secondary | ICD-10-CM

## 2015-10-05 DIAGNOSIS — Z1389 Encounter for screening for other disorder: Secondary | ICD-10-CM

## 2015-10-05 DIAGNOSIS — Z331 Pregnant state, incidental: Secondary | ICD-10-CM

## 2015-10-05 DIAGNOSIS — Z0283 Encounter for blood-alcohol and blood-drug test: Secondary | ICD-10-CM

## 2015-10-05 HISTORY — DX: Encounter for supervision of normal pregnancy, unspecified, first trimester: Z34.91

## 2015-10-05 LAB — POCT URINALYSIS DIPSTICK
Blood, UA: NEGATIVE
Glucose, UA: NEGATIVE
Ketones, UA: NEGATIVE
LEUKOCYTES UA: NEGATIVE
Nitrite, UA: NEGATIVE
Protein, UA: NEGATIVE

## 2015-10-05 NOTE — Patient Instructions (Signed)
Second Trimester of Pregnancy The second trimester is from week 13 through week 28, months 4 through 6. The second trimester is often a time when you feel your best. Your body has also adjusted to being pregnant, and you begin to feel better physically. Usually, morning sickness has lessened or quit completely, you may have more energy, and you may have an increase in appetite. The second trimester is also a time when the fetus is growing rapidly. At the end of the sixth month, the fetus is about 9 inches long and weighs about 1 pounds. You will likely begin to feel the baby move (quickening) between 18 and 20 weeks of the pregnancy. BODY CHANGES Your body goes through many changes during pregnancy. The changes vary from woman to woman.   Your weight will continue to increase. You will notice your lower abdomen bulging out.  You may begin to get stretch marks on your hips, abdomen, and breasts.  You may develop headaches that can be relieved by medicines approved by your health care provider.  You may urinate more often because the fetus is pressing on your bladder.  You may develop or continue to have heartburn as a result of your pregnancy.  You may develop constipation because certain hormones are causing the muscles that push waste through your intestines to slow down.  You may develop hemorrhoids or swollen, bulging veins (varicose veins).  You may have back pain because of the weight gain and pregnancy hormones relaxing your joints between the bones in your pelvis and as a result of a shift in weight and the muscles that support your balance.  Your breasts will continue to grow and be tender.  Your gums may bleed and may be sensitive to brushing and flossing.  Dark spots or blotches (chloasma, mask of pregnancy) may develop on your face. This will likely fade after the baby is born.  A dark line from your belly button to the pubic area (linea nigra) may appear. This will likely fade  after the baby is born.  You may have changes in your hair. These can include thickening of your hair, rapid growth, and changes in texture. Some women also have hair loss during or after pregnancy, or hair that feels dry or thin. Your hair will most likely return to normal after your baby is born. WHAT TO EXPECT AT YOUR PRENATAL VISITS During a routine prenatal visit:  You will be weighed to make sure you and the fetus are growing normally.  Your blood pressure will be taken.  Your abdomen will be measured to track your baby's growth.  The fetal heartbeat will be listened to.  Any test results from the previous visit will be discussed. Your health care provider may ask you:  How you are feeling.  If you are feeling the baby move.  If you have had any abnormal symptoms, such as leaking fluid, bleeding, severe headaches, or abdominal cramping.  If you are using any tobacco products, including cigarettes, chewing tobacco, and electronic cigarettes.  If you have any questions. Other tests that may be performed during your second trimester include:  Blood tests that check for:  Low iron levels (anemia).  Gestational diabetes (between 24 and 28 weeks).  Rh antibodies.  Urine tests to check for infections, diabetes, or protein in the urine.  An ultrasound to confirm the proper growth and development of the baby.  An amniocentesis to check for possible genetic problems.  Fetal screens for spina bifida   and Down syndrome.  HIV (human immunodeficiency virus) testing. Routine prenatal testing includes screening for HIV, unless you choose not to have this test. HOME CARE INSTRUCTIONS   Avoid all smoking, herbs, alcohol, and unprescribed drugs. These chemicals affect the formation and growth of the baby.  Do not use any tobacco products, including cigarettes, chewing tobacco, and electronic cigarettes. If you need help quitting, ask your health care provider. You may receive  counseling support and other resources to help you quit.  Follow your health care provider's instructions regarding medicine use. There are medicines that are either safe or unsafe to take during pregnancy.  Exercise only as directed by your health care provider. Experiencing uterine cramps is a good sign to stop exercising.  Continue to eat regular, healthy meals.  Wear a good support bra for breast tenderness.  Do not use hot tubs, steam rooms, or saunas.  Wear your seat belt at all times when driving.  Avoid raw meat, uncooked cheese, cat litter boxes, and soil used by cats. These carry germs that can cause birth defects in the baby.  Take your prenatal vitamins.  Take 1500-2000 mg of calcium daily starting at the 20th week of pregnancy until you deliver your baby.  Try taking a stool softener (if your health care provider approves) if you develop constipation. Eat more high-fiber foods, such as fresh vegetables or fruit and whole grains. Drink plenty of fluids to keep your urine clear or pale yellow.  Take warm sitz baths to soothe any pain or discomfort caused by hemorrhoids. Use hemorrhoid cream if your health care provider approves.  If you develop varicose veins, wear support hose. Elevate your feet for 15 minutes, 3-4 times a day. Limit salt in your diet.  Avoid heavy lifting, wear low heel shoes, and practice good posture.  Rest with your legs elevated if you have leg cramps or low back pain.  Visit your dentist if you have not gone yet during your pregnancy. Use a soft toothbrush to brush your teeth and be gentle when you floss.  A sexual relationship may be continued unless your health care provider directs you otherwise.  Continue to go to all your prenatal visits as directed by your health care provider. SEEK MEDICAL CARE IF:   You have dizziness.  You have mild pelvic cramps, pelvic pressure, or nagging pain in the abdominal area.  You have persistent nausea,  vomiting, or diarrhea.  You have a bad smelling vaginal discharge.  You have pain with urination. SEEK IMMEDIATE MEDICAL CARE IF:   You have a fever.  You are leaking fluid from your vagina.  You have spotting or bleeding from your vagina.  You have severe abdominal cramping or pain.  You have rapid weight gain or loss.  You have shortness of breath with chest pain.  You notice sudden or extreme swelling of your face, hands, ankles, feet, or legs.  You have not felt your baby move in over an hour.  You have severe headaches that do not go away with medicine.  You have vision changes.   This information is not intended to replace advice given to you by your health care provider. Make sure you discuss any questions you have with your health care provider.   Document Released: 08/19/2001 Document Revised: 09/15/2014 Document Reviewed: 10/26/2012 Elsevier Interactive Patient Education 2016 [place] of Pregnancy The first trimester of pregnancy is from week 1 until the end of week 12 (months 1 through  3). A week after a sperm fertilizes an egg, the egg will implant on the wall of the uterus. This embryo will begin to develop into a baby. Genes from you and your partner are forming the baby. The female genes determine whether the baby is a boy or a girl. At 6-8 weeks, the eyes and face are formed, and the heartbeat can be seen on ultrasound. At the end of 12 weeks, all the baby's organs are formed.  Now that you are pregnant, you will want to do everything you can to have a healthy baby. Two of the most important things are to get good prenatal care and to follow your health care provider's instructions. Prenatal care is all the medical care you receive before the baby's birth. This care will help prevent, find, and treat any problems during the pregnancy and childbirth. BODY CHANGES Your body goes through many changes during pregnancy. The changes vary from woman to  woman.   You may gain or lose a couple of pounds at first.  You may feel sick to your stomach (nauseous) and throw up (vomit). If the vomiting is uncontrollable, call your health care provider.  You may tire easily.  You may develop headaches that can be relieved by medicines approved by your health care provider.  You may urinate more often. Painful urination may mean you have a bladder infection.  You may develop heartburn as a result of your pregnancy.  You may develop constipation because certain hormones are causing the muscles that push waste through your intestines to slow down.  You may develop hemorrhoids or swollen, bulging veins (varicose veins).  Your breasts may begin to grow larger and become tender. Your nipples may stick out more, and the tissue that surrounds them (areola) may become darker.  Your gums may bleed and may be sensitive to brushing and flossing.  Dark spots or blotches (chloasma, mask of pregnancy) may develop on your face. This will likely fade after the baby is born.  Your menstrual periods will stop.  You may have a loss of appetite.  You may develop cravings for certain kinds of food.  You may have changes in your emotions from day to day, such as being excited to be pregnant or being concerned that something may go wrong with the pregnancy and baby.  You may have more vivid and strange dreams.  You may have changes in your hair. These can include thickening of your hair, rapid growth, and changes in texture. Some women also have hair loss during or after pregnancy, or hair that feels dry or thin. Your hair will most likely return to normal after your baby is born. WHAT TO EXPECT AT YOUR PRENATAL VISITS During a routine prenatal visit:  You will be weighed to make sure you and the baby are growing normally.  Your blood pressure will be taken.  Your abdomen will be measured to track your baby's growth.  The fetal heartbeat will be listened  to starting around week 10 or 12 of your pregnancy.  Test results from any previous visits will be discussed. Your health care provider may ask you:  How you are feeling.  If you are feeling the baby move.  If you have had any abnormal symptoms, such as leaking fluid, bleeding, severe headaches, or abdominal cramping.  If you are using any tobacco products, including cigarettes, chewing tobacco, and electronic cigarettes.  If you have any questions. Other tests that may be performed during your  first trimester include:  Blood tests to find your blood type and to check for the presence of any previous infections. They will also be used to check for low iron levels (anemia) and Rh antibodies. Later in the pregnancy, blood tests for diabetes will be done along with other tests if problems develop.  Urine tests to check for infections, diabetes, or protein in the urine.  An ultrasound to confirm the proper growth and development of the baby.  An amniocentesis to check for possible genetic problems.  Fetal screens for spina bifida and Down syndrome.  You may need other tests to make sure you and the baby are doing well.  HIV (human immunodeficiency virus) testing. Routine prenatal testing includes screening for HIV, unless you choose not to have this test. HOME CARE INSTRUCTIONS  Medicines  Follow your health care provider's instructions regarding medicine use. Specific medicines may be either safe or unsafe to take during pregnancy.  Take your prenatal vitamins as directed.  If you develop constipation, try taking a stool softener if your health care provider approves. Diet  Eat regular, well-balanced meals. Choose a variety of foods, such as meat or vegetable-based protein, fish, milk and low-fat dairy products, vegetables, fruits, and whole grain breads and cereals. Your health care provider will help you determine the amount of weight gain that is right for you.  Avoid raw  meat and uncooked cheese. These carry germs that can cause birth defects in the baby.  Eating four or five small meals rather than three large meals a day may help relieve nausea and vomiting. If you start to feel nauseous, eating a few soda crackers can be helpful. Drinking liquids between meals instead of during meals also seems to help nausea and vomiting.  If you develop constipation, eat more high-fiber foods, such as fresh vegetables or fruit and whole grains. Drink enough fluids to keep your urine clear or pale yellow. Activity and Exercise  Exercise only as directed by your health care provider. Exercising will help you:  Control your weight.  Stay in shape.  Be prepared for labor and delivery.  Experiencing pain or cramping in the lower abdomen or low back is a good sign that you should stop exercising. Check with your health care provider before continuing normal exercises.  Try to avoid standing for long periods of time. Move your legs often if you must stand in one place for a long time.  Avoid heavy lifting.  Wear low-heeled shoes, and practice good posture.  You may continue to have sex unless your health care provider directs you otherwise. Relief of Pain or Discomfort  Wear a good support bra for breast tenderness.   Take warm sitz baths to soothe any pain or discomfort caused by hemorrhoids. Use hemorrhoid cream if your health care provider approves.   Rest with your legs elevated if you have leg cramps or low back pain.  If you develop varicose veins in your legs, wear support hose. Elevate your feet for 15 minutes, 3-4 times a day. Limit salt in your diet. Prenatal Care  Schedule your prenatal visits by the twelfth week of pregnancy. They are usually scheduled monthly at first, then more often in the last 2 months before delivery.  Write down your questions. Take them to your prenatal visits.  Keep all your prenatal visits as directed by your health care  provider. Safety  Wear your seat belt at all times when driving.  Make a list of emergency phone  numbers, including numbers for family, friends, the hospital, and police and fire departments. General Tips  Ask your health care provider for a referral to a local prenatal education class. Begin classes no later than at the beginning of month 6 of your pregnancy.  Ask for help if you have counseling or nutritional needs during pregnancy. Your health care provider can offer advice or refer you to specialists for help with various needs.  Do not use hot tubs, steam rooms, or saunas.  Do not douche or use tampons or scented sanitary pads.  Do not cross your legs for long periods of time.  Avoid cat litter boxes and soil used by cats. These carry germs that can cause birth defects in the baby and possibly loss of the fetus by miscarriage or stillbirth.  Avoid all smoking, herbs, alcohol, and medicines not prescribed by your health care provider. Chemicals in these affect the formation and growth of the baby.  Do not use any tobacco products, including cigarettes, chewing tobacco, and electronic cigarettes. If you need help quitting, ask your health care provider. You may receive counseling support and other resources to help you quit.  Schedule a dentist appointment. At home, brush your teeth with a soft toothbrush and be gentle when you floss. SEEK MEDICAL CARE IF:   You have dizziness.  You have mild pelvic cramps, pelvic pressure, or nagging pain in the abdominal area.  You have persistent nausea, vomiting, or diarrhea.  You have a bad smelling vaginal discharge.  You have pain with urination.  You notice increased swelling in your face, hands, legs, or ankles. SEEK IMMEDIATE MEDICAL CARE IF:   You have a fever.  You are leaking fluid from your vagina.  You have spotting or bleeding from your vagina.  You have severe abdominal cramping or pain.  You have rapid weight gain  or loss.  You vomit blood or material that looks like coffee grounds.  You are exposed to [place] measles and have never had them.  You are exposed to fifth disease or chickenpox.  You develop a severe headache.  You have shortness of breath.  You have any kind of trauma, such as from a fall or a car accident.   This information is not intended to replace advice given to you by your health care provider. Make sure you discuss any questions you have with your health care provider.   Document Released: 08/19/2001 Document Revised: 09/15/2014 Document Reviewed: 07/05/2013 Elsevier Interactive Patient Education [employer] Return in 4 weeks No sex for 7 days after spotting

## 2015-10-05 NOTE — Progress Notes (Signed)
Subjective:  Nicole [name] is a 20 y.o. G51P0000 Caucasian female at [redacted]w[redacted]d by LMP and [place] being seen today for her first obstetrical visit.  Her obstetrical history is significant for her mom is deaf.  Pregnancy history fully reviewed.  Patient reports bleeding yesterday after heavy lifting. Denies uti s/s, abnormal/malodorous vag d/c, or vulvovaginal itching/irritation.  BP 122/82 mmHg  Pulse 72  Wt 174 lb (78.926 kg)  LMP 07/28/2015 (Approximate)  HISTORY: OB History  Gravida Para Term Preterm AB SAB TAB Ectopic Multiple Living     # Outcome Date GA Lbr Len/2nd Weight Sex Delivery Anes PTL Lv  1 Current              Past Medical History  Diagnosis Date  . ADD (attention deficit disorder)   . Vertigo   . BV (bacterial vaginosis) 03/18/2013    Treat ed with flagyl  . Contraceptive management 05/29/2015   History reviewed. No pertinent past surgical history. Family History  Problem Relation Age of Onset  . Glaucoma Maternal Grandmother   . Hypertension Maternal Grandmother   . Deafness Mother   . Arthritis Mother   . Dementia Paternal Grandmother   . COPD Maternal Grandfather   . Heart attack Father   . Deafness Father     Exam   System:     General: Well developed & nourished, no acute distress   Skin: Warm & dry, normal coloration and turgor, no rashes   Neurologic: Alert & oriented, normal mood   Cardiovascular: Regular rate & rhythm   Respiratory: Effort & rate normal, LCTAB, acyanotic   Abdomen: Soft, non tender   Extremities: normal strength, tone   Pelvic Exam:    Perineum: Normal perineum   Vulva: Normal, no lesions   Vagina:  Normal mucosa, normal discharge   Cervix: Normal, bulbous, appears closed   Uterus: Normal size/shape/contour for GA   Thin prep pap smear with GC/CHL FHR: 171  via [place]   Assessment:   Pregnancy: G1P0000 Patient Active Problem List   Diagnosis Date Noted  . Supervision of normal pregnancy in first trimester  10/05/2015  . Pregnant 09/04/2015  . BV (bacterial vaginosis) 03/18/2013  . ADD (attention deficit disorder) 12/22/2012  . Vertigo 12/22/2012    [redacted]w[redacted]d G1P0000 New OB visit     Plan:  Initial labs drawn Continue prenatal vitamins Problem list reviewed and updated Reviewed n/v relief measures and warning s/s to report Reviewed recommended weight gain based on pre-gravid BMI Encouraged well-balanced diet Genetic Screening discussed Quad Screen: requested Cystic fibrosis screening discussed requested Ultrasound discussed; fetal survey: requested Follow up in 4 weeks for OB visit   Adline Potter, NP 10/05/2015 10:27 AM

## 2015-10-06 LAB — URINE CULTURE

## 2015-10-09 LAB — CYTOLOGY - PAP

## 2015-10-17 ENCOUNTER — Telehealth: Payer: Self-pay | Admitting: Adult Health

## 2015-10-17 LAB — HEPATITIS B SURFACE ANTIGEN: Hepatitis B Surface Ag: NEGATIVE

## 2015-10-17 LAB — CBC
HEMATOCRIT: 38.2 % (ref 34.0–46.6)
Hemoglobin: 13.2 g/dL (ref 11.1–15.9)
MCH: 29.8 pg (ref 26.6–33.0)
MCHC: 34.6 g/dL (ref 31.5–35.7)
MCV: 86 fL (ref 79–97)
PLATELETS: 196 10*3/uL (ref 150–379)
RBC: 4.43 x10E6/uL (ref 3.77–5.28)
RDW: 14 % (ref 12.3–15.4)
WBC: 9.5 10*3/uL (ref 3.4–10.8)

## 2015-10-17 LAB — URINALYSIS, ROUTINE W REFLEX MICROSCOPIC
Bilirubin, UA: NEGATIVE
Glucose, UA: NEGATIVE
Ketones, UA: NEGATIVE
Leukocytes, UA: NEGATIVE
Nitrite, UA: NEGATIVE
Protein, UA: NEGATIVE
RBC, UA: NEGATIVE
SPEC GRAV UA: 1.018 (ref 1.005–1.030)
Urobilinogen, Ur: 0.2 mg/dL (ref 0.2–1.0)
pH, UA: 8 — ABNORMAL HIGH (ref 5.0–7.5)

## 2015-10-17 LAB — PMP SCREEN PROFILE (10S), URINE
Amphetamine Screen, Ur: NEGATIVE ng/mL
BARBITURATE SCRN UR: NEGATIVE ng/mL
BENZODIAZEPINE SCREEN, URINE: NEGATIVE ng/mL
CANNABINOIDS UR QL SCN: NEGATIVE ng/mL
COCAINE(METAB.) SCREEN, URINE: NEGATIVE ng/mL
Creatinine(Crt), U: 85.4 mg/dL (ref 20.0–300.0)
METHADONE SCREEN, URINE: NEGATIVE ng/mL
OPIATE SCRN UR: NEGATIVE ng/mL
Oxycodone+Oxymorphone Ur Ql Scn: NEGATIVE ng/mL
PCP Scrn, Ur: NEGATIVE ng/mL
PROPOXYPHENE SCREEN: NEGATIVE ng/mL
Ph of Urine: 8.1 (ref 4.5–8.9)

## 2015-10-17 LAB — ANTIBODY SCREEN: Antibody Screen: NEGATIVE

## 2015-10-17 LAB — ABO/RH: RH TYPE: POSITIVE

## 2015-10-17 LAB — RUBELLA SCREEN: RUBELLA: 1.35 {index} (ref >0.99)

## 2015-10-17 LAB — RPR: RPR Ser Ql: NONREACTIVE

## 2015-10-17 LAB — VARICELLA ZOSTER ANTIBODY, IGG: Varicella zoster IgG: 306 index (ref >165)

## 2015-10-17 LAB — CYSTIC FIBROSIS MUTATION 97

## 2015-10-17 LAB — HIV ANTIBODY (ROUTINE TESTING W REFLEX): HIV Screen 4th Generation wRfx: NONREACTIVE

## 2015-10-17 NOTE — Telephone Encounter (Signed)
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

## 2015-11-01 ENCOUNTER — Ambulatory Visit (INDEPENDENT_AMBULATORY_CARE_PROVIDER_SITE_OTHER): Payer: BLUE CROSS/BLUE SHIELD | Admitting: Advanced Practice Midwife

## 2015-11-01 VITALS — BP 100/64 | HR 76 | Wt 177.0 lb

## 2015-11-01 DIAGNOSIS — Z3A14 14 weeks gestation of pregnancy: Secondary | ICD-10-CM

## 2015-11-01 DIAGNOSIS — Z1389 Encounter for screening for other disorder: Secondary | ICD-10-CM

## 2015-11-01 DIAGNOSIS — Z3481 Encounter for supervision of other normal pregnancy, first trimester: Secondary | ICD-10-CM

## 2015-11-01 DIAGNOSIS — Z3491 Encounter for supervision of normal pregnancy, unspecified, first trimester: Secondary | ICD-10-CM

## 2015-11-01 DIAGNOSIS — Z331 Pregnant state, incidental: Secondary | ICD-10-CM

## 2015-11-01 LAB — POCT URINALYSIS DIPSTICK
Blood, UA: NEGATIVE
Glucose, UA: NEGATIVE
Ketones, UA: NEGATIVE
LEUKOCYTES UA: NEGATIVE
Nitrite, UA: NEGATIVE
PROTEIN UA: NEGATIVE

## 2015-11-01 NOTE — Progress Notes (Addendum)
G1P0000 [redacted]w[redacted]d Estimated Date of Delivery: 05/03/16  Blood pressure 100/64, pulse 76, weight 177 lb (80.287 kg), last menstrual period 07/28/2015.   BP weight and urine results all reviewed and noted. Pt has a CF variant.  FOB declines testing. Mom, great-great aunt, and 2 2nd cousins all born deaf.  Dad is deaf, too, but from viral illness as a baby. Declined genetic testing.   Please refer to the obstetrical flow sheet for the fundal height and fetal heart rate documentation:  Patient denies any bleeding and no rupture of membranes symptoms or regular contractions. Patient is without complaints. Has some pink discharge when wiping off and on , mainly after exercise/heavy lifting.  All questions were answered.  Orders Placed This Encounter  Procedures  . POCT urinalysis dipstick    Plan:  Continued routine obstetrical care,   Return in about 3 weeks (around 11/22/2015) for LROB.

## 2015-11-07 ENCOUNTER — Telehealth: Payer: Self-pay | Admitting: Advanced Practice Midwife

## 2015-11-07 NOTE — Telephone Encounter (Signed)
Pt aware that it would be ok to fly in May per Dr. Emelda Fear.

## 2015-11-22 ENCOUNTER — Encounter: Payer: Self-pay | Admitting: Advanced Practice Midwife

## 2015-11-22 ENCOUNTER — Ambulatory Visit (INDEPENDENT_AMBULATORY_CARE_PROVIDER_SITE_OTHER): Payer: BLUE CROSS/BLUE SHIELD | Admitting: Advanced Practice Midwife

## 2015-11-22 VITALS — BP 104/70 | HR 84 | Wt 177.0 lb

## 2015-11-22 DIAGNOSIS — Z363 Encounter for antenatal screening for malformations: Secondary | ICD-10-CM

## 2015-11-22 DIAGNOSIS — Z331 Pregnant state, incidental: Secondary | ICD-10-CM

## 2015-11-22 DIAGNOSIS — Z1389 Encounter for screening for other disorder: Secondary | ICD-10-CM

## 2015-11-22 DIAGNOSIS — Z3491 Encounter for supervision of normal pregnancy, unspecified, first trimester: Secondary | ICD-10-CM

## 2015-11-22 DIAGNOSIS — Z3481 Encounter for supervision of other normal pregnancy, first trimester: Secondary | ICD-10-CM

## 2015-11-22 DIAGNOSIS — Z3682 Encounter for antenatal screening for nuchal translucency: Secondary | ICD-10-CM

## 2015-11-22 LAB — POCT URINALYSIS DIPSTICK
Blood, UA: NEGATIVE
Glucose, UA: NEGATIVE
LEUKOCYTES UA: NEGATIVE
Nitrite, UA: NEGATIVE
Protein, UA: 1

## 2015-11-22 NOTE — Progress Notes (Signed)
G1P0000 6374w5d Estimated Date of Delivery: 05/03/16  Blood pressure 104/70, pulse 84, weight 177 lb (80.287 kg), last menstrual period 07/28/2015.   BP weight and urine results all reviewed and noted. Denies UTI sx.   Please refer to the obstetrical flow sheet for the fundal height and fetal heart rate documentation:  Patient denies any bleeding and no rupture of membranes symptoms or regular contractions. Patient is without complaints. All questions were answered.  Orders Placed This Encounter  Procedures  . POCT urinalysis dipstick    Plan:  Continued routine obstetrical care, AFP today.  Culture urine  Return in about 3 weeks (around 12/13/2015) for LROB, UJ:[id number]:Anatomy.

## 2015-11-22 NOTE — Addendum Note (Signed)
Addended by: Criss AlvinePULLIAM, CHRYSTAL G on: 11/22/2015 03:08 PM   Modules accepted: Orders

## 2015-11-25 LAB — URINE CULTURE

## 2015-11-29 LAB — AFP, QUAD SCREEN
DIA MOM VALUE: 0.82
DIA Value (EIA): 130.52 pg/mL
DSR (BY AGE) 1 IN: 1128
DSR (Second Trimester) 1 IN: 10000
GESTATIONAL AGE AFP: 16.7 wk
MSAFP MOM: 1.25
MSAFP: 40.5 ng/mL
MSHCG MOM: 1.08
MSHCG: 34432 m[IU]/mL
Maternal Age At EDD: 22 YEARS
OSB RISK: 5576
PDF: 0
T18 (By Age): 1:4395 {titer}
TEST RESULTS AFP: NEGATIVE
Weight: 177 [lb_av]
uE3 Mom: 1.21
uE3 Value: 1.13 ng/mL

## 2015-12-10 ENCOUNTER — Other Ambulatory Visit: Payer: Self-pay | Admitting: Obstetrics and Gynecology

## 2015-12-10 DIAGNOSIS — Z1389 Encounter for screening for other disorder: Secondary | ICD-10-CM

## 2015-12-10 DIAGNOSIS — Z3A19 19 weeks gestation of pregnancy: Secondary | ICD-10-CM

## 2015-12-12 ENCOUNTER — Other Ambulatory Visit: Payer: Self-pay | Admitting: Advanced Practice Midwife

## 2015-12-12 ENCOUNTER — Ambulatory Visit (INDEPENDENT_AMBULATORY_CARE_PROVIDER_SITE_OTHER): Payer: BLUE CROSS/BLUE SHIELD | Admitting: Advanced Practice Midwife

## 2015-12-12 ENCOUNTER — Ambulatory Visit (INDEPENDENT_AMBULATORY_CARE_PROVIDER_SITE_OTHER): Payer: BLUE CROSS/BLUE SHIELD

## 2015-12-12 VITALS — BP 110/68 | HR 92 | Wt 179.0 lb

## 2015-12-12 DIAGNOSIS — Z3A19 19 weeks gestation of pregnancy: Secondary | ICD-10-CM

## 2015-12-12 DIAGNOSIS — Z1389 Encounter for screening for other disorder: Secondary | ICD-10-CM

## 2015-12-12 DIAGNOSIS — Z36 Encounter for antenatal screening of mother: Secondary | ICD-10-CM

## 2015-12-12 DIAGNOSIS — Z3481 Encounter for supervision of other normal pregnancy, first trimester: Secondary | ICD-10-CM

## 2015-12-12 DIAGNOSIS — Z331 Pregnant state, incidental: Secondary | ICD-10-CM

## 2015-12-12 DIAGNOSIS — Z3491 Encounter for supervision of normal pregnancy, unspecified, first trimester: Secondary | ICD-10-CM

## 2015-12-12 LAB — POCT URINALYSIS DIPSTICK
Blood, UA: NEGATIVE
GLUCOSE UA: NEGATIVE
Ketones, UA: NEGATIVE
NITRITE UA: NEGATIVE
Protein, UA: NEGATIVE

## 2015-12-12 NOTE — Progress Notes (Signed)
G1P0000 3821w4d Estimated Date of Delivery: 05/03/16  Blood pressure 110/68, pulse 92, weight 179 lb (81.194 kg), last menstrual period 07/28/2015.   BP weight and urine results all reviewed and noted.  Please refer to the obstetrical flow sheet for the fundal height and fetal heart rate documentation:  Anatomy scan today: US 19+4 wks,efw 285 g,measurements c/w dates,normal ov's bilat,cephalic,svp of fluid 5cm,fhr [id number] bpm,cx 3.1 cm,post pl gr o,anatomy complete no obvious abn seen  Patient reports good fetal movement, denies any bleeding and no rupture of membranes symptoms or regular contractions. Patient is without complaints.Saw some green Mucus X1 when wiped yesteday.  SSE: normal appearing dc today All questions were answered.  Orders Placed This Encounter  Procedures  . POCT urinalysis dipstick    Plan:  Continued routine obstetrical care,   Return in about 4 weeks (around 01/09/2016) for LROB.

## 2015-12-12 NOTE — Progress Notes (Addendum)
US 19+4 wks,efw 285 g,measurements c/w dates,normal ov's bilat,cephalic,svp of fluid 5cm,fhr [id number] bpm,cx 3.1 cm,post pl gr o,anatomy complete no obvious abn seen

## 2015-12-13 ENCOUNTER — Other Ambulatory Visit: Payer: BLUE CROSS/BLUE SHIELD

## 2015-12-13 ENCOUNTER — Encounter: Payer: BLUE CROSS/BLUE SHIELD | Admitting: Advanced Practice Midwife

## 2016-01-07 ENCOUNTER — Encounter: Payer: Self-pay | Admitting: Physician Assistant

## 2016-01-07 ENCOUNTER — Ambulatory Visit (INDEPENDENT_AMBULATORY_CARE_PROVIDER_SITE_OTHER): Payer: BLUE CROSS/BLUE SHIELD | Admitting: Physician Assistant

## 2016-01-07 VITALS — BP 104/60 | HR 88 | Temp 98.3°F | Resp 18 | Wt 184.0 lb

## 2016-01-07 DIAGNOSIS — J988 Other specified respiratory disorders: Secondary | ICD-10-CM | POA: Diagnosis not present

## 2016-01-07 DIAGNOSIS — B9689 Other specified bacterial agents as the cause of diseases classified elsewhere: Principal | ICD-10-CM

## 2016-01-07 MED ORDER — AMOXICILLIN 875 MG PO TABS: 875.0000 mg | ORAL_TABLET | Freq: Two times a day (BID) | ORAL | Status: DC

## 2016-01-07 NOTE — Progress Notes (Signed)
    Patient ID: Nicole ParishKatie M [name] MRN: [id number], DOB: 11-06-1995, 20 y.o. Date of Encounter: 01/07/2016, 2:44 PM    Chief Complaint:  Chief Complaint  Patient presents with  . c/o allergies/URI    cough/congestion/body aches  no fever  . [redacted] weeks pregnant     HPI: 20 y.o. year old female presents with above. Says that she is getting yellow-green mucus out of her nose and yellow green phlegm when she coughs. Says that in the beginning her throat was a little bit sore but that has resolved. Has had no known fevers or chills.     Home Meds:   Outpatient Prescriptions Prior to Visit  Medication Sig Dispense Refill  . acetaminophen (TYLENOL) 325 MG tablet Take 650 mg by mouth as needed.    . Prenatal Vit-Fe Fumarate-FA (PRENATAL VITAMIN PO) Take by mouth daily.     No facility-administered medications prior to visit.    Allergies:  Allergies  Allergen Reactions  . Poison Ivy Extract [Extract Of Poison Ivy]       Review of Systems: See HPI for pertinent ROS. All other ROS negative.    Physical Exam: Blood pressure 104/60, pulse 88, temperature 98.3 F (36.8 C), temperature source Oral, resp. rate 18, weight 184 lb (83.462 kg), last menstrual period 07/28/2015., Body mass index is 28.81 kg/(m^2). General:  WNWD WF. Appears in no acute distress. HEENT: Normocephalic, atraumatic, eyes without discharge, sclera non-icteric, nares are without discharge. Bilateral auditory canals clear, TM's are without perforation, pearly grey and translucent with reflective cone of light bilaterally. Oral cavity moist, posterior pharynx without exudate, erythema, peritonsillar abscess.  Neck: Supple. No thyromegaly. No lymphadenopathy. Lungs: Clear bilaterally to auscultation without wheezes, rales, or rhonchi. Breathing is unlabored. Heart: Regular rhythm. No murmurs, rubs, or gallops. Msk:  Strength and tone normal for age. Extremities/Skin: Warm and dry. Neuro: Alert and oriented X 3. Moves all  extremities spontaneously. Gait is normal. CNII-XII grossly in tact. Psych:  Responds to questions appropriately with a normal affect.     ASSESSMENT AND PLAN:  20 y.o. year old female with  1. Bacterial respiratory infection Amoxicillin as category B pregnancy. She is to take this twice daily for 7 days. Complete all of it. Follow-up if symptoms do not resolve upon completion of antibiotic. - amoxicillin (AMOXIL) 875 MG tablet; Take 1 tablet (875 mg total) by mouth 2 (two) times daily.  Dispense: 14 tablet; Refill: 0   Signed, [address]Mary Beth Two [place]tes[place], [place], [facility] 01/07/2016 2:44 PM

## 2016-01-14 ENCOUNTER — Encounter: Payer: Self-pay | Admitting: Women's Health

## 2016-01-14 ENCOUNTER — Ambulatory Visit (INDEPENDENT_AMBULATORY_CARE_PROVIDER_SITE_OTHER): Payer: BLUE CROSS/BLUE SHIELD | Admitting: Women's Health

## 2016-01-14 VITALS — BP 118/62 | HR 84 | Wt 186.0 lb

## 2016-01-14 DIAGNOSIS — Z331 Pregnant state, incidental: Secondary | ICD-10-CM

## 2016-01-14 DIAGNOSIS — O09899 Supervision of other high risk pregnancies, unspecified trimester: Secondary | ICD-10-CM | POA: Insufficient documentation

## 2016-01-14 DIAGNOSIS — Z1389 Encounter for screening for other disorder: Secondary | ICD-10-CM

## 2016-01-14 DIAGNOSIS — O09892 Supervision of other high risk pregnancies, second trimester: Secondary | ICD-10-CM

## 2016-01-14 DIAGNOSIS — Z141 Cystic fibrosis carrier: Secondary | ICD-10-CM

## 2016-01-14 DIAGNOSIS — Z3402 Encounter for supervision of normal first pregnancy, second trimester: Secondary | ICD-10-CM

## 2016-01-14 LAB — POCT URINALYSIS DIPSTICK
Blood, UA: NEGATIVE
Glucose, UA: NEGATIVE
KETONES UA: NEGATIVE
LEUKOCYTES UA: NEGATIVE
Nitrite, UA: NEGATIVE
PROTEIN UA: NEGATIVE

## 2016-01-14 NOTE — Patient Instructions (Signed)
You will have your sugar test next visit.  Please do not eat or drink anything after midnight the night before you come, not even water.  You will be here for at least two hours.     Call the office ([phone]) or go to [facility] if:  You begin to have strong, frequent contractions  Your water breaks.  Sometimes it is a big gush of fluid, sometimes it is just a trickle that keeps getting your panties wet or running down your legs  You have vaginal bleeding.  It is normal to have a small amount of spotting if your cervix was checked.   You don't feel your baby moving like normal.  If you don't, get you something to eat and drink and lay down and focus on feeling your baby move.   If your baby is still not moving like normal, you should call the office or go to [facility].  Second Trimester of Pregnancy The second trimester is from week 13 through week 28, months 4 through 6. The second trimester is often a time when you feel your best. Your body has also adjusted to being pregnant, and you begin to feel better physically. Usually, morning sickness has lessened or quit completely, you may have more energy, and you may have an increase in appetite. The second trimester is also a time when the fetus is growing rapidly. At the end of the sixth month, the fetus is about 9 inches long and weighs about 1 pounds. You will likely begin to feel the baby move (quickening) between 18 and 20 weeks of the pregnancy. BODY CHANGES Your body goes through many changes during pregnancy. The changes vary from woman to woman.   Your weight will continue to increase. You will notice your lower abdomen bulging out.  You may begin to get stretch marks on your hips, abdomen, and breasts.  You may develop headaches that can be relieved by medicines approved by your health care provider.  You may urinate more often because the fetus is pressing on your bladder.  You may develop or continue to have  heartburn as a result of your pregnancy.  You may develop constipation because certain hormones are causing the muscles that push waste through your intestines to slow down.  You may develop hemorrhoids or swollen, bulging veins (varicose veins).  You may have back pain because of the weight gain and pregnancy hormones relaxing your joints between the bones in your pelvis and as a result of a shift in weight and the muscles that support your balance.  Your breasts will continue to grow and be tender.  Your gums may bleed and may be sensitive to brushing and flossing.  Dark spots or blotches (chloasma, mask of pregnancy) may develop on your face. This will likely fade after the baby is born.  A dark line from your belly button to the pubic area (linea nigra) may appear. This will likely fade after the baby is born.  You may have changes in your hair. These can include thickening of your hair, rapid growth, and changes in texture. Some women also have hair loss during or after pregnancy, or hair that feels dry or thin. Your hair will most likely return to normal after your baby is born. WHAT TO EXPECT AT YOUR PRENATAL VISITS During a routine prenatal visit:  You will be weighed to make sure you and the fetus are growing normally.  Your blood pressure will be taken.    Your abdomen will be measured to track your baby's growth.  The fetal heartbeat will be listened to.  Any test results from the previous visit will be discussed. Your health care provider may ask you:  How you are feeling.  If you are feeling the baby move.  If you have had any abnormal symptoms, such as leaking fluid, bleeding, severe headaches, or abdominal cramping.  If you have any questions. Other tests that may be performed during your second trimester include:  Blood tests that check for:  Low iron levels (anemia).  Gestational diabetes (between 24 and 28 weeks).  Rh antibodies.  Urine tests to check  for infections, diabetes, or protein in the urine.  An ultrasound to confirm the proper growth and development of the baby.  An amniocentesis to check for possible genetic problems.  Fetal screens for spina bifida and Down syndrome. HOME CARE INSTRUCTIONS   Avoid all smoking, herbs, alcohol, and unprescribed drugs. These chemicals affect the formation and growth of the baby.  Follow your health care provider's instructions regarding medicine use. There are medicines that are either safe or unsafe to take during pregnancy.  Exercise only as directed by your health care provider. Experiencing uterine cramps is a good sign to stop exercising.  Continue to eat regular, healthy meals.  Wear a good support bra for breast tenderness.  Do not use hot tubs, steam rooms, or saunas.  Wear your seat belt at all times when driving.  Avoid raw meat, uncooked cheese, cat litter boxes, and soil used by cats. These carry germs that can cause birth defects in the baby.  Take your prenatal vitamins.  Try taking a stool softener (if your health care provider approves) if you develop constipation. Eat more high-fiber foods, such as fresh vegetables or fruit and whole grains. Drink plenty of fluids to keep your urine clear or pale yellow.  Take warm sitz baths to soothe any pain or discomfort caused by hemorrhoids. Use hemorrhoid cream if your health care provider approves.  If you develop varicose veins, wear support hose. Elevate your feet for 15 minutes, 3-4 times a day. Limit salt in your diet.  Avoid heavy lifting, wear low heel shoes, and practice good posture.  Rest with your legs elevated if you have leg cramps or low back pain.  Visit your dentist if you have not gone yet during your pregnancy. Use a soft toothbrush to brush your teeth and be gentle when you floss.  A sexual relationship may be continued unless your health care provider directs you otherwise.  Continue to go to all your  prenatal visits as directed by your health care provider. SEEK MEDICAL CARE IF:   You have dizziness.  You have mild pelvic cramps, pelvic pressure, or nagging pain in the abdominal area.  You have persistent nausea, vomiting, or diarrhea.  You have a bad smelling vaginal discharge.  You have pain with urination. SEEK IMMEDIATE MEDICAL CARE IF:   You have a fever.  You are leaking fluid from your vagina.  You have spotting or bleeding from your vagina.  You have severe abdominal cramping or pain.  You have rapid weight gain or loss.  You have shortness of breath with chest pain.  You notice sudden or extreme swelling of your face, hands, ankles, feet, or legs.  You have not felt your baby move in over an hour.  You have severe headaches that do not go away with medicine.  You have vision changes.   Document Released: 08/19/2001 Document Revised: 08/30/2013 Document Reviewed: 10/26/2012 ExitCare Patient Information 2015 [employer]. This information is not intended to replace advice given to you by your health care provider. Make sure you discuss any questions you have with your health care provider.     

## 2016-01-14 NOTE — Progress Notes (Signed)
Low-risk OB appointment G1P0000 1266w2d Estimated Date of Delivery: 05/03/16 BP 118/62 mmHg  Pulse 84  Wt 186 lb (84.369 kg)  LMP 07/28/2015 (Approximate)  BP, weight, and urine reviewed.  Refer to obstetrical flow sheet for FH & FHR.  Reports good fm.  Denies regular uc's, lof, vb, or uti s/s. No complaints. Interested in cb classes, gave info. 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  Reviewed ptl s/s, fm. Plan:  Continue routine obstetrical care  F/U in 4wks for OB appointment and pn2

## 2016-02-12 ENCOUNTER — Encounter: Payer: Self-pay | Admitting: Women's Health

## 2016-02-12 ENCOUNTER — Ambulatory Visit (INDEPENDENT_AMBULATORY_CARE_PROVIDER_SITE_OTHER): Payer: BLUE CROSS/BLUE SHIELD | Admitting: Women's Health

## 2016-02-12 ENCOUNTER — Ambulatory Visit: Payer: BLUE CROSS/BLUE SHIELD

## 2016-02-12 VITALS — BP 96/62 | HR 76 | Wt 189.0 lb

## 2016-02-12 DIAGNOSIS — Z36 Encounter for antenatal screening of mother: Secondary | ICD-10-CM | POA: Diagnosis not present

## 2016-02-12 DIAGNOSIS — Z131 Encounter for screening for diabetes mellitus: Secondary | ICD-10-CM | POA: Diagnosis not present

## 2016-02-12 DIAGNOSIS — Z1389 Encounter for screening for other disorder: Secondary | ICD-10-CM

## 2016-02-12 DIAGNOSIS — Z331 Pregnant state, incidental: Secondary | ICD-10-CM

## 2016-02-12 DIAGNOSIS — Z3403 Encounter for supervision of normal first pregnancy, third trimester: Secondary | ICD-10-CM

## 2016-02-12 DIAGNOSIS — Z369 Encounter for antenatal screening, unspecified: Secondary | ICD-10-CM

## 2016-02-12 LAB — POCT URINALYSIS DIPSTICK
Glucose, UA: NEGATIVE
KETONES UA: NEGATIVE
Nitrite, UA: NEGATIVE
PROTEIN UA: NEGATIVE
RBC UA: NEGATIVE

## 2016-02-12 NOTE — Progress Notes (Signed)
Low-risk OB appointment G1P0000 639w3d Estimated Date of Delivery: 05/03/16 BP 96/62 mmHg  Pulse 76  Wt 189 lb (85.73 kg)  LMP 07/28/2015 (Approximate)  BP, weight, and urine reviewed.  Refer to obstetrical flow sheet for FH & FHR.  Reports good fm.  Denies regular uc's, lof, vb, or uti s/s. No complaints. Reviewed ptl s/s, fkc. Recommended Tdap at HD/PCP per CDC guidelines.  Plan:  Continue routine obstetrical care  F/U in 4wks for OB appointment  PN2 today

## 2016-02-12 NOTE — Patient Instructions (Signed)

## 2016-02-13 LAB — GLUCOSE TOLERANCE, 2 HOURS W/ 1HR
GLUCOSE, 1 HOUR: 92 mg/dL (ref 65–179)
GLUCOSE, 2 HOUR: 76 mg/dL (ref 65–152)
Glucose, Fasting: 78 mg/dL (ref 65–91)

## 2016-02-13 LAB — CBC
HEMOGLOBIN: 11.4 g/dL (ref 11.1–15.9)
Hematocrit: 34.7 % (ref 34.0–46.6)
MCH: 29.5 pg (ref 26.6–33.0)
MCHC: 32.9 g/dL (ref 31.5–35.7)
MCV: 90 fL (ref 79–97)
Platelets: 174 10*3/uL (ref 150–379)
RBC: 3.86 x10E6/uL (ref 3.77–5.28)
RDW: 13.5 % (ref 12.3–15.4)
WBC: 9.9 10*3/uL (ref 3.4–10.8)

## 2016-02-13 LAB — ANTIBODY SCREEN: ANTIBODY SCREEN: NEGATIVE

## 2016-02-13 LAB — HIV ANTIBODY (ROUTINE TESTING W REFLEX): HIV Screen 4th Generation wRfx: NONREACTIVE

## 2016-02-13 LAB — RPR: RPR: NONREACTIVE

## 2016-03-07 ENCOUNTER — Ambulatory Visit (INDEPENDENT_AMBULATORY_CARE_PROVIDER_SITE_OTHER): Payer: BLUE CROSS/BLUE SHIELD | Admitting: Family Medicine

## 2016-03-07 DIAGNOSIS — Z23 Encounter for immunization: Secondary | ICD-10-CM | POA: Diagnosis not present

## 2016-03-07 DIAGNOSIS — Z3490 Encounter for supervision of normal pregnancy, unspecified, unspecified trimester: Secondary | ICD-10-CM

## 2016-03-12 ENCOUNTER — Encounter: Payer: Self-pay | Admitting: Obstetrics & Gynecology

## 2016-03-12 ENCOUNTER — Ambulatory Visit (INDEPENDENT_AMBULATORY_CARE_PROVIDER_SITE_OTHER): Payer: BLUE CROSS/BLUE SHIELD | Admitting: Obstetrics & Gynecology

## 2016-03-12 VITALS — BP 92/60 | HR 88 | Wt 193.0 lb

## 2016-03-12 DIAGNOSIS — Z3403 Encounter for supervision of normal first pregnancy, third trimester: Secondary | ICD-10-CM

## 2016-03-12 DIAGNOSIS — Z3A34 34 weeks gestation of pregnancy: Secondary | ICD-10-CM

## 2016-03-12 NOTE — Progress Notes (Signed)
G1P0000 3736w4d Estimated Date of Delivery: 05/03/16  Blood pressure 92/60, pulse 88, weight 193 lb (87.544 kg), last menstrual period 07/28/2015.   BP weight and urine results all reviewed and noted.  Please refer to the obstetrical flow sheet for the fundal height and fetal heart rate documentation:  Patient reports good fetal movement, denies any bleeding and no rupture of membranes symptoms or regular contractions. Patient is without complaints. All questions were answered.  No orders of the defined types were placed in this encounter.    Plan:  Continued routine obstetrical care,   No Follow-up on file.

## 2016-03-26 ENCOUNTER — Encounter: Payer: BLUE CROSS/BLUE SHIELD | Admitting: Obstetrics & Gynecology

## 2016-03-28 ENCOUNTER — Encounter: Payer: Self-pay | Admitting: Obstetrics & Gynecology

## 2016-03-28 ENCOUNTER — Ambulatory Visit (INDEPENDENT_AMBULATORY_CARE_PROVIDER_SITE_OTHER): Payer: BLUE CROSS/BLUE SHIELD | Admitting: Obstetrics & Gynecology

## 2016-03-28 VITALS — BP 110/80 | HR 80 | Wt 197.0 lb

## 2016-03-28 DIAGNOSIS — Z1389 Encounter for screening for other disorder: Secondary | ICD-10-CM

## 2016-03-28 DIAGNOSIS — Z3403 Encounter for supervision of normal first pregnancy, third trimester: Secondary | ICD-10-CM

## 2016-03-28 DIAGNOSIS — Z331 Pregnant state, incidental: Secondary | ICD-10-CM

## 2016-03-28 LAB — POCT URINALYSIS DIPSTICK
Blood, UA: NEGATIVE
Glucose, UA: NEGATIVE
KETONES UA: NEGATIVE
LEUKOCYTES UA: NEGATIVE
NITRITE UA: NEGATIVE
PROTEIN UA: NEGATIVE

## 2016-03-28 NOTE — Progress Notes (Signed)
G1P0000 5759w6d Estimated Date of Delivery: 05/03/16  Blood pressure 110/80, pulse 80, weight 197 lb (89.359 kg), last menstrual period 07/28/2015.   BP weight and urine results all reviewed and noted.  Please refer to the obstetrical flow sheet for the fundal height and fetal heart rate documentation:  Patient reports good fetal movement, denies any bleeding and no rupture of membranes symptoms or regular contractions. Patient is without complaints. All questions were answered.  Orders Placed This Encounter  Procedures  . POCT urinalysis dipstick    Plan:  Continued routine obstetrical care, watch FH progression  No Follow-up on file.

## 2016-04-02 ENCOUNTER — Telehealth: Payer: Self-pay | Admitting: *Deleted

## 2016-04-02 NOTE — Telephone Encounter (Signed)
Pt requesting rx for breast pump faxed to [facility] fax # [phone]. Breast pump Rx faxed per pt request. Pt aware.

## 2016-04-11 ENCOUNTER — Encounter: Payer: BLUE CROSS/BLUE SHIELD | Admitting: Obstetrics & Gynecology

## 2016-04-15 ENCOUNTER — Ambulatory Visit (INDEPENDENT_AMBULATORY_CARE_PROVIDER_SITE_OTHER): Payer: BLUE CROSS/BLUE SHIELD | Admitting: Obstetrics & Gynecology

## 2016-04-15 VITALS — BP 108/80 | HR 76 | Wt 200.5 lb

## 2016-04-15 DIAGNOSIS — Z1389 Encounter for screening for other disorder: Secondary | ICD-10-CM

## 2016-04-15 DIAGNOSIS — Z1159 Encounter for screening for other viral diseases: Secondary | ICD-10-CM

## 2016-04-15 DIAGNOSIS — Z118 Encounter for screening for other infectious and parasitic diseases: Secondary | ICD-10-CM

## 2016-04-15 DIAGNOSIS — Z331 Pregnant state, incidental: Secondary | ICD-10-CM

## 2016-04-15 DIAGNOSIS — Z3403 Encounter for supervision of normal first pregnancy, third trimester: Secondary | ICD-10-CM

## 2016-04-15 DIAGNOSIS — Z36 Encounter for antenatal screening of mother: Secondary | ICD-10-CM | POA: Diagnosis not present

## 2016-04-15 DIAGNOSIS — Z3685 Encounter for antenatal screening for Streptococcus B: Secondary | ICD-10-CM

## 2016-04-15 LAB — POCT URINALYSIS DIPSTICK
Glucose, UA: NEGATIVE
KETONES UA: NEGATIVE
Leukocytes, UA: NEGATIVE
Nitrite, UA: NEGATIVE
PROTEIN UA: NEGATIVE
RBC UA: NEGATIVE

## 2016-04-15 LAB — OB RESULTS CONSOLE GBS
GBS: POSITIVE
STREP GROUP B AG: POSITIVE

## 2016-04-15 NOTE — Progress Notes (Signed)
G1P0000 3641w3d Estimated Date of Delivery: 05/03/16  Blood pressure 108/80, pulse 76, weight 200 lb 8 oz (90.9 kg), last menstrual period 07/28/2015.   BP weight and urine results all reviewed and noted.  Please refer to the obstetrical flow sheet for the fundal height and fetal heart rate documentation:  Patient reports good fetal movement, denies any bleeding and no rupture of membranes symptoms or regular contractions. Patient is without complaints. All questions were answered.  Orders Placed This Encounter  Procedures  . Strep Gp B NAA  . GC/Chlamydia Probe Amp  . POCT urinalysis dipstick    Plan:  Continued routine obstetrical care, FH progression flat, will do sonogram next visit  Return in about 1 week (around 04/22/2016) for LROB.

## 2016-04-17 LAB — GC/CHLAMYDIA PROBE AMP
CHLAMYDIA, DNA PROBE: NEGATIVE
NEISSERIA GONORRHOEAE BY PCR: NEGATIVE

## 2016-04-17 LAB — STREP GP B NAA: Strep Gp B NAA: POSITIVE — AB

## 2016-04-22 ENCOUNTER — Ambulatory Visit (INDEPENDENT_AMBULATORY_CARE_PROVIDER_SITE_OTHER): Payer: BLUE CROSS/BLUE SHIELD | Admitting: Obstetrics & Gynecology

## 2016-04-22 ENCOUNTER — Other Ambulatory Visit: Payer: Self-pay | Admitting: Obstetrics & Gynecology

## 2016-04-22 ENCOUNTER — Encounter (HOSPITAL_COMMUNITY): Payer: Self-pay | Admitting: *Deleted

## 2016-04-22 ENCOUNTER — Telehealth (HOSPITAL_COMMUNITY): Payer: Self-pay | Admitting: *Deleted

## 2016-04-22 ENCOUNTER — Ambulatory Visit (INDEPENDENT_AMBULATORY_CARE_PROVIDER_SITE_OTHER): Payer: BLUE CROSS/BLUE SHIELD

## 2016-04-22 VITALS — BP 98/68 | HR 84 | Wt 198.0 lb

## 2016-04-22 DIAGNOSIS — Z331 Pregnant state, incidental: Secondary | ICD-10-CM

## 2016-04-22 DIAGNOSIS — O0993 Supervision of high risk pregnancy, unspecified, third trimester: Secondary | ICD-10-CM

## 2016-04-22 DIAGNOSIS — O365931 Maternal care for other known or suspected poor fetal growth, third trimester, fetus 1: Secondary | ICD-10-CM

## 2016-04-22 DIAGNOSIS — Z1389 Encounter for screening for other disorder: Secondary | ICD-10-CM

## 2016-04-22 DIAGNOSIS — Z3403 Encounter for supervision of normal first pregnancy, third trimester: Secondary | ICD-10-CM | POA: Diagnosis not present

## 2016-04-22 DIAGNOSIS — Z3A39 39 weeks gestation of pregnancy: Secondary | ICD-10-CM | POA: Diagnosis not present

## 2016-04-22 LAB — POCT URINALYSIS DIPSTICK
Blood, UA: NEGATIVE
Glucose, UA: NEGATIVE
Ketones, UA: NEGATIVE
NITRITE UA: NEGATIVE

## 2016-04-22 NOTE — Progress Notes (Signed)
US 38+3 wks,cephalic,post pl gr 3,fhr 148 bpm,AFI 13 cm,efw 2706 g 8% [place],limited head measurement because of fetal pos,RI .57,.54

## 2016-04-22 NOTE — Progress Notes (Signed)
Fetal Surveillance Testing today:  Sonogram excellent Doppler flow   High Risk Pregnancy Diagnosis(es):   IUGR 8%, good fluid and good Doppler flow  G1P0000 6575w3d Estimated Date of Delivery: 05/03/16  Blood pressure 98/68, pulse 84, weight 198 lb (89.8 kg), last menstrual period 07/28/2015.  Urinalysis: Negative   HPI: The patient is being seen today for ongoing management of suspected IUGR. Today she reports good FM   BP weight and urine results all reviewed and noted. Patient reports good fetal movement, denies any bleeding and no rupture of membranes symptoms or regular contractions.  Fundal Height:  33 Fetal Heart rate:  142 Edema:  none  Patient is without complaints other than noted in her HPI. All questions were answered.  All lab and sonogram results have been reviewed. Comments: abnormal: IUGR  Assessment:  1.  Pregnancy at 2475w3d,  Estimated Date of Delivery: 05/03/16 :                          2.  IUGR                        3.    Medication(s) Plans:  none  Treatment Plan:  Plan induction at 39 weeks due to IUGR @8th  percentile without associated complicating factors  Return in about 6 weeks (around 06/03/2016) for post partum visit. for appointment for high risk OB care  No orders of the defined types were placed in this encounter.  Orders Placed This Encounter  Procedures  . POCT urinalysis dipstick

## 2016-04-22 NOTE — Telephone Encounter (Signed)
Preadmission screen  

## 2016-04-28 ENCOUNTER — Inpatient Hospital Stay (HOSPITAL_COMMUNITY): Payer: Medicaid Other | Admitting: Anesthesiology

## 2016-04-28 ENCOUNTER — Inpatient Hospital Stay (HOSPITAL_COMMUNITY)
Admission: RE | Admit: 2016-04-28 | Discharge: 2016-05-01 | DRG: 775 | Disposition: A | Discharge status: Home / Self Care | Payer: Medicaid Other | Source: Ambulatory Visit | Attending: Family Medicine | Admitting: Family Medicine

## 2016-04-28 ENCOUNTER — Encounter (HOSPITAL_COMMUNITY): Payer: Self-pay

## 2016-04-28 DIAGNOSIS — Z825 Family history of asthma and other chronic lower respiratory diseases: Secondary | ICD-10-CM | POA: Diagnosis not present

## 2016-04-28 DIAGNOSIS — Z141 Cystic fibrosis carrier: Secondary | ICD-10-CM

## 2016-04-28 DIAGNOSIS — Z3A39 39 weeks gestation of pregnancy: Secondary | ICD-10-CM | POA: Diagnosis not present

## 2016-04-28 DIAGNOSIS — IMO0002 Reserved for concepts with insufficient information to code with codable children: Secondary | ICD-10-CM | POA: Diagnosis present

## 2016-04-28 DIAGNOSIS — O365930 Maternal care for other known or suspected poor fetal growth, third trimester, not applicable or unspecified: Principal | ICD-10-CM | POA: Diagnosis present

## 2016-04-28 DIAGNOSIS — Z8249 Family history of ischemic heart disease and other diseases of the circulatory system: Secondary | ICD-10-CM | POA: Diagnosis not present

## 2016-04-28 DIAGNOSIS — O99824 Streptococcus B carrier state complicating childbirth: Secondary | ICD-10-CM | POA: Diagnosis present

## 2016-04-28 LAB — CBC
HEMATOCRIT: 34.2 % — AB (ref 36.0–46.0)
HEMOGLOBIN: 11.9 g/dL — AB (ref 12.0–15.0)
MCH: 30.6 pg (ref 26.0–34.0)
MCHC: 34.8 g/dL (ref 30.0–36.0)
MCV: 87.9 fL (ref 78.0–100.0)
Platelets: 164 10*3/uL (ref 150–400)
RBC: 3.89 MIL/uL (ref 3.87–5.11)
RDW: 14.3 % (ref 11.5–15.5)
WBC: 11.8 10*3/uL — ABNORMAL HIGH (ref 4.0–10.5)

## 2016-04-28 LAB — TYPE AND SCREEN
ABO/RH(D): A POS
ANTIBODY SCREEN: NEGATIVE

## 2016-04-28 LAB — ABO/RH: ABO/RH(D): A POS

## 2016-04-28 LAB — RPR: RPR Ser Ql: NONREACTIVE

## 2016-04-28 MED ORDER — FENTANYL CITRATE (PF) 100 MCG/2ML IJ SOLN
50.0000 ug | INTRAMUSCULAR | Status: DC | PRN
  Administered 2016-04-28: 100 ug via INTRAVENOUS
  Filled 2016-04-28: qty 2

## 2016-04-28 MED ORDER — DEXTROSE 5 % IV SOLN
5.0000 10*6.[IU] | Freq: Once | INTRAVENOUS | Status: AC
  Administered 2016-04-29: 5 10*6.[IU] via INTRAVENOUS
  Filled 2016-04-28: qty 5

## 2016-04-28 MED ORDER — CALCIUM CARBONATE ANTACID 500 MG PO CHEW
1.0000 | CHEWABLE_TABLET | ORAL | Status: DC | PRN
  Administered 2016-04-29: 200 mg via ORAL
  Filled 2016-04-28 (×2): qty 1

## 2016-04-28 MED ORDER — MISOPROSTOL 25 MCG QUARTER TABLET
25.0000 ug | ORAL_TABLET | ORAL | Status: DC
  Administered 2016-04-28 (×2): 25 ug via ORAL
  Filled 2016-04-28 (×3): qty 0.25

## 2016-04-28 MED ORDER — FENTANYL 2.5 MCG/ML BUPIVACAINE 1/10 % EPIDURAL INFUSION (WH - ANES)
14.0000 mL/h | INTRAMUSCULAR | Status: DC | PRN
  Administered 2016-04-28 – 2016-04-29 (×3): 14 mL/h via EPIDURAL
  Filled 2016-04-28 (×3): qty 125

## 2016-04-28 MED ORDER — EPHEDRINE 5 MG/ML INJ
10.0000 mg | INTRAVENOUS | Status: DC | PRN
  Filled 2016-04-28: qty 4

## 2016-04-28 MED ORDER — ACETAMINOPHEN 325 MG PO TABS
650.0000 mg | ORAL_TABLET | ORAL | Status: DC | PRN
  Administered 2016-04-29: 650 mg via ORAL
  Filled 2016-04-28: qty 2

## 2016-04-28 MED ORDER — SOD CITRATE-CITRIC ACID 500-334 MG/5ML PO SOLN
30.0000 mL | ORAL | Status: DC | PRN
Start: 2016-04-28 — End: 2016-04-29

## 2016-04-28 MED ORDER — OXYCODONE-ACETAMINOPHEN 5-325 MG PO TABS: 1.0000 | ORAL_TABLET | ORAL | Status: DC | PRN

## 2016-04-28 MED ORDER — LACTATED RINGERS IV SOLN
500.0000 mL | Freq: Once | INTRAVENOUS | Status: AC
  Administered 2016-04-28: 500 mL via INTRAVENOUS

## 2016-04-28 MED ORDER — LIDOCAINE HCL (PF) 1 % IJ SOLN
30.0000 mL | INTRAMUSCULAR | Status: DC | PRN
  Filled 2016-04-28: qty 30

## 2016-04-28 MED ORDER — PENICILLIN G POTASSIUM 5000000 UNITS IJ SOLR
2.5000 10*6.[IU] | INTRAVENOUS | Status: DC
  Filled 2016-04-28 (×5): qty 2.5

## 2016-04-28 MED ORDER — LACTATED RINGERS IV SOLN
500.0000 mL | INTRAVENOUS | Status: DC | PRN
  Administered 2016-04-29: 250 mL via INTRAVENOUS

## 2016-04-28 MED ORDER — OXYTOCIN BOLUS FROM INFUSION
500.0000 mL | Freq: Once | INTRAVENOUS | Status: AC
  Administered 2016-04-29: 500 mL via INTRAVENOUS

## 2016-04-28 MED ORDER — PENICILLIN G POTASSIUM 5000000 UNITS IJ SOLR
2.5000 10*6.[IU] | INTRAVENOUS | Status: DC
  Administered 2016-04-29 (×2): 2.5 10*6.[IU] via INTRAVENOUS
  Filled 2016-04-28 (×8): qty 2.5

## 2016-04-28 MED ORDER — OXYTOCIN 40 UNITS IN LACTATED RINGERS INFUSION - SIMPLE MED
1.0000 m[IU]/min | INTRAVENOUS | Status: DC
  Administered 2016-04-28: 2 m[IU]/min via INTRAVENOUS

## 2016-04-28 MED ORDER — MISOPROSTOL 50MCG HALF TABLET
50.0000 ug | ORAL_TABLET | Freq: Once | ORAL | Status: AC
  Administered 2016-04-28: 50 ug via ORAL
  Filled 2016-04-28: qty 0.5

## 2016-04-28 MED ORDER — DEXTROSE 5 % IV SOLN
5.0000 10*6.[IU] | Freq: Once | INTRAVENOUS | Status: DC
  Filled 2016-04-28: qty 5

## 2016-04-28 MED ORDER — FLEET ENEMA 7-19 GM/118ML RE ENEM: 1.0000 | ENEMA | Freq: Every day | RECTAL | Status: DC | PRN

## 2016-04-28 MED ORDER — ONDANSETRON HCL 4 MG/2ML IJ SOLN
4.0000 mg | Freq: Four times a day (QID) | INTRAMUSCULAR | Status: DC | PRN
  Administered 2016-04-28 – 2016-04-29 (×3): 4 mg via INTRAVENOUS
  Filled 2016-04-28 (×3): qty 2

## 2016-04-28 MED ORDER — PHENYLEPHRINE 40 MCG/ML (10ML) SYRINGE FOR IV PUSH (FOR BLOOD PRESSURE SUPPORT)
80.0000 ug | PREFILLED_SYRINGE | INTRAVENOUS | Status: DC | PRN
  Filled 2016-04-28: qty 10
  Filled 2016-04-28: qty 5

## 2016-04-28 MED ORDER — TERBUTALINE SULFATE 1 MG/ML IJ SOLN
0.2500 mg | Freq: Once | INTRAMUSCULAR | Status: DC | PRN
  Filled 2016-04-28: qty 1

## 2016-04-28 MED ORDER — DIPHENHYDRAMINE HCL 50 MG/ML IJ SOLN: 12.5000 mg | INTRAMUSCULAR | Status: DC | PRN

## 2016-04-28 MED ORDER — OXYCODONE-ACETAMINOPHEN 5-325 MG PO TABS: 2.0000 | ORAL_TABLET | ORAL | Status: DC | PRN

## 2016-04-28 MED ORDER — OXYTOCIN 40 UNITS IN LACTATED RINGERS INFUSION - SIMPLE MED
2.5000 [IU]/h | INTRAVENOUS | Status: DC
  Filled 2016-04-28: qty 1000

## 2016-04-28 MED ORDER — LACTATED RINGERS IV SOLN
INTRAVENOUS | Status: DC
  Administered 2016-04-28 – 2016-04-29 (×3): via INTRAVENOUS

## 2016-04-28 MED ORDER — LIDOCAINE HCL (PF) 1 % IJ SOLN
INTRAMUSCULAR | Status: DC | PRN
  Administered 2016-04-28 (×2): 6 mL

## 2016-04-28 NOTE — Progress Notes (Signed)
LABOR PROGRESS NOTE  Nicole [name] is a 20 y.o. G1P0000 at 3659w2d  admitted for IOL for IUGR  Subjective: Doing well, is amenable to foley bulb.   Objective: BP 102/60   Pulse 84   Temp 97.7 F (36.5 C) (Oral)   Resp 16   Ht 5\' 7"  (1.702 m)   Wt 90.7 kg (200 lb)   LMP 07/28/2015 (Approximate)   BMI 31.32 kg/m  or  Vitals:   04/28/16 0800 04/28/16 1111 04/28/16 1534 04/28/16 1916  BP:  106/61 (!) 103/55 102/60  Pulse:  94 64 84  Resp:  15 17 16   Temp: 97.9 F (36.6 C) 97.9 F (36.6 C) 97.9 F (36.6 C) 97.7 F (36.5 C)  TempSrc: Oral Oral Oral Oral  Weight:      Height:        Dilation: 2 Effacement (%): 50 Cervical Position: Posterior Station: -2 Presentation: Vertex Exam by:: [name], CNM FHT: 140, moderate variability, +accelerations, no decelerations Uterine activity: q4-265min  Labs: Lab Results  Component Value Date   WBC 11.8 (H) 04/28/2016   HGB 11.9 (L) 04/28/2016   HCT 34.2 (L) 04/28/2016   MCV 87.9 04/28/2016   PLT 164 04/28/2016    Patient Active Problem List   Diagnosis Date Noted  . IUGR (intrauterine growth restriction) 04/28/2016  . 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 Supervision of normal first pregnancy 10/05/2015  . BV (bacterial vaginosis) 03/18/2013  . ADD (attention deficit disorder) 12/22/2012  . Vertigo 12/22/2012    Assessment / Plan: 20 y.o. G1P0000 at 8559w2d here for IOL for IUGR  Labor: foley bulb Fetal Wellbeing:  Category I Pain Control:  Labor support without medications Anticipated MOD:  SVD  Leland HerElsia J Geral Tuch, DO PGY-1 8/21/20178:44 PM

## 2016-04-28 NOTE — Anesthesia Procedure Notes (Signed)
Epidural Patient location during procedure: OB Start time: 04/28/2016 9:31 PM  Staffing Anesthesiologist: Sherrian DiversENENNY, Demira Gwynne  Preanesthetic Checklist Completed: patient identified, site marked, surgical consent, pre-op evaluation, timeout performed, IV checked, risks and benefits discussed and monitors and equipment checked  Epidural Patient position: sitting Prep: DuraPrep Patient monitoring: blood pressure and heart rate Approach: midline Location: L4-L5 Injection technique: LOR saline  Needle:  Needle type: Tuohy  Needle gauge: 17 G Needle length: 9 cm Needle insertion depth: 4 cm Catheter type: closed end flexible Catheter size: 19 Gauge Catheter at skin depth: 12 cm Test dose: negative and Other  Assessment Events: blood not aspirated, injection not painful, no injection resistance, negative IV test and no paresthesia  Additional Notes Reason for block:procedure for pain

## 2016-04-28 NOTE — Anesthesia Pain Management Evaluation Note (Signed)
  CRNA Pain Management Visit Note  Patient: Nicole AblerKatie M [name], 20 y.o., female  "[facility]. We have an anesthesia team available at all times to provide care throughout the hospital, including epidural management and anesthesia for C-section. I don't know your plan for the delivery whether it a natural birth, water birth, IV sedation, nitrous supplementation, doula or epidural, but we want to meet your pain goals."   1.Was your pain managed to your expectations on prior hospitalizations?   No prior hospitalizations  2.What is your expectation for pain management during this hospitalization?     Epidural, IV pain meds and Nitrous Oxide  3.How can we help you reach that goal? Patient interested in N2O and IV pain medications, and possibly interested in epidural if needed.  Information given to patient, but it was emphasized that, despite me giving her information about the epidural, this is her plan.  Record the patient's initial score and the patient's pain goal.   Pain: 0  Pain Goal: 7 [facility] wants you to be able to say your pain was always managed very well.  Jaleia Hanke L 04/28/2016

## 2016-04-28 NOTE — H&P (Signed)
Nicole [name] is a 20 y.o. female G1 @ 39.2 wks presenting for IOL for IUGR. OB History    Gravida Para Term Preterm AB Living   1 0 0 0 0 0   SAB TAB Ectopic Multiple Live Births   0 0 0 0       Past Medical History:  Diagnosis Date  . ADD (attention deficit disorder)   . BV (bacterial vaginosis) 03/18/2013   Treat ed with flagyl  . Contraceptive management 05/29/2015  . Supervision of normal pregnancy in first trimester 10/05/2015    [facility]  Initiated Care at   9+6 weeks  FOB  Dorian HeckleJonathan Herrmann 20 yo WM  Dating By  LMP and US  Pap  10/05/15 negative +yeast  GC/CT Initial:     -/-           36+wks:  Genetic Screen AFP  CF screen   1 variant detected ; FOB declines testing  Anatomic US   Flu vaccine  10/05/15  Tdap Recommended ~ 28wks  Glucose Screen  2 hr  GBS   Feed Preference   Contraception   Circumcision   Childbirth Classes   Pediatrician      . Vertigo    No past surgical history on file. Family History: family history includes Anxiety disorder in her maternal grandmother; Arthritis in her mother; COPD in her maternal grandfather; Deafness in her father and mother; Dementia in her paternal grandmother; Glaucoma in her maternal grandmother; Heart attack in her father; Hypertension in her maternal grandmother. Social History:  reports that she has never smoked. She has never used smokeless tobacco. She reports that she does not drink alcohol or use drugs.     Maternal Diabetes: No Genetic Screening: Normal Maternal Ultrasounds/Referrals: Normal Fetal Ultrasounds or other Referrals:  None Maternal Substance Abuse:  No Significant Maternal Medications:  None Significant Maternal Lab Results:  None Other Comments:  None  Review of Systems  Constitutional: Negative.   HENT: Negative.   Eyes: Negative.   Respiratory: Negative.   Cardiovascular: Negative.   Gastrointestinal: Negative.   Genitourinary: Negative.   Musculoskeletal: Negative.   Skin: Negative.    Neurological: Negative.   Endo/Heme/Allergies: Negative.   Psychiatric/Behavioral: Negative.    Maternal Medical History:  Reason for admission: Admitted for IOL for IUGR 8th %.  Fetal activity: Perceived fetal activity is normal.   Last perceived fetal movement was within the past hour.    Prenatal complications: IUGR.   Prenatal Complications - Diabetes: none.      Blood pressure 109/74, pulse 85, temperature 98.4 F (36.9 C), temperature source Oral, resp. rate 18, height 5\' 7"  (1.702 m), weight 200 lb (90.7 kg), last menstrual period 07/28/2015. Maternal Exam:  Uterine Assessment: No contractions  Abdomen: Fetal presentation: vertex  Introitus: Normal vulva. Normal vagina.  Ferning test: not done.  Nitrazine test: not done. Amniotic fluid character: not assessed.  Pelvis: adequate for delivery.   Cervix: Cervix evaluated by digital exam.     Fetal Exam Fetal Monitor Review: Mode: ultrasound.   Variability: moderate (6-25 bpm).   Pattern: accelerations present.    Fetal State Assessment: Category I - tracings are normal.     Physical Exam  Constitutional: She is oriented to person, place, and time. She appears well-developed and well-nourished.  HENT:  Head: Normocephalic.  Eyes: Pupils are equal, round, and reactive to light.  Neck: Normal range of motion.  Cardiovascular: Normal rate, regular rhythm, normal heart sounds  and intact distal pulses.   Respiratory: Effort normal and breath sounds normal.  GI: Soft. Bowel sounds are normal.  Genitourinary: Vagina normal and uterus normal.  Musculoskeletal: Normal range of motion.  Neurological: She is alert and oriented to person, place, and time. She has normal reflexes.  Skin: Skin is warm and dry.  Psychiatric: She has a normal mood and affect. Her behavior is normal. Judgment and thought content normal.    Prenatal labs: ABO, Rh: A/Positive/-- (01/27 1047) Antibody: Negative (06/06 0917) Rubella: 1.35  (01/27 1047) RPR: Non Reactive (06/06 0917)  HBsAg: Negative (01/27 1047)  HIV: Non Reactive (06/06 0917)  GBS: Positive (08/08 1600)   Assessment/Plan: SVE 1/th/post/-3 vertex. GBS neg. cytotec induction of labor   Nicole [name] 04/28/2016, 2:03 AM

## 2016-04-28 NOTE — Progress Notes (Signed)
Dionicia AblerKatie M Othman is a 20 y.o. G1P0000 at 7325w2d by ultrasound admitted for induction of labor due to Poor fetal growth.  Subjective:   Objective: BP 102/61   Pulse 78   Temp 98.2 F (36.8 C) (Oral)   Resp 18   Ht 5\' 7"  (1.702 m)   Wt 90.7 kg (200 lb)   LMP 07/28/2015 (Approximate)   BMI 31.32 kg/m  No intake/output data recorded. No intake/output data recorded.  FHT:  FHR: 120s bpm, variability: moderate,  accelerations:  Present,  decelerations:  Absent UC:   irregular, every 3-5 minutes SVE:   Dilation: 1 Effacement (%): Thick Station: -3 Exam by:: DLawson  Labs: Lab Results  Component Value Date   WBC 11.8 (H) 04/28/2016   HGB 11.9 (L) 04/28/2016   HCT 34.2 (L) 04/28/2016   MCV 87.9 04/28/2016   PLT 164 04/28/2016    Assessment / Plan: IOL, not yet in labor   Labor: IOL, not yet in labor  Preeclampsia:  no signs or symptoms of toxicity and intake and ouput balanced Fetal Wellbeing:  Category I Pain Control:  Labor support without medications I/D:  n/a Anticipated MOD:  NSVD  Renetta Chalkshley Denym Christenberry 04/28/2016, 7:55 AM

## 2016-04-28 NOTE — Anesthesia Preprocedure Evaluation (Signed)
Anesthesia Evaluation  Patient identified by MRN, date of birth, ID band Patient awake    Reviewed: Allergy & Precautions, NPO status , Patient's Chart, lab work & pertinent test results  Airway Mallampati: II  TM Distance: >3 FB Neck ROM: Full    Dental no notable dental hx.    Pulmonary neg pulmonary ROS,    Pulmonary exam normal breath sounds clear to auscultation       Cardiovascular negative cardio ROS Normal cardiovascular exam Rhythm:Regular Rate:Normal     Neuro/Psych negative neurological ROS  negative psych ROS   GI/Hepatic negative GI ROS, Neg liver ROS,   Endo/Other  negative endocrine ROS  Renal/GU negative Renal ROS  negative genitourinary   Musculoskeletal negative musculoskeletal ROS (+)   Abdominal   Peds negative pediatric ROS (+)  Hematology negative hematology ROS (+)   Anesthesia Other Findings   Reproductive/Obstetrics negative OB ROS                             Anesthesia Physical Anesthesia Plan  ASA: II  Anesthesia Plan: Epidural   Post-op Pain Management:    Induction: Intravenous  Airway Management Planned: Natural Airway  Additional Equipment:   Intra-op Plan:   Post-operative Plan:   Informed Consent: I have reviewed the patients History and Physical, chart, labs and discussed the procedure including the risks, benefits and alternatives for the proposed anesthesia with the patient or authorized representative who has indicated his/her understanding and acceptance.   Dental advisory given  Plan Discussed with: CRNA  Anesthesia Plan Comments: (Informed consent obtained prior to proceeding including risk of failure, 1% risk of PDPH, risk of minor discomfort and bruising.  Discussed rare but serious complications including epidural abscess, permanent nerve injury, epidural hematoma.  Discussed alternatives to epidural analgesia and patient desires  to proceed.  Timeout performed pre-procedure verifying patient name, procedure, and platelet count.  Patient tolerated procedure well.)        Anesthesia Quick Evaluation  

## 2016-04-29 ENCOUNTER — Encounter (HOSPITAL_COMMUNITY): Payer: Self-pay

## 2016-04-29 DIAGNOSIS — O365930 Maternal care for other known or suspected poor fetal growth, third trimester, not applicable or unspecified: Secondary | ICD-10-CM

## 2016-04-29 DIAGNOSIS — O99824 Streptococcus B carrier state complicating childbirth: Secondary | ICD-10-CM

## 2016-04-29 DIAGNOSIS — Z141 Cystic fibrosis carrier: Secondary | ICD-10-CM

## 2016-04-29 DIAGNOSIS — Z3A39 39 weeks gestation of pregnancy: Secondary | ICD-10-CM

## 2016-04-29 MED ORDER — ONDANSETRON HCL 4 MG/2ML IJ SOLN: 4.0000 mg | INTRAMUSCULAR | Status: DC | PRN

## 2016-04-29 MED ORDER — ACETAMINOPHEN 325 MG PO TABS
650.0000 mg | ORAL_TABLET | ORAL | Status: DC | PRN
  Administered 2016-04-29 – 2016-04-30 (×3): 650 mg via ORAL
  Filled 2016-04-29 (×3): qty 2

## 2016-04-29 MED ORDER — SENNOSIDES-DOCUSATE SODIUM 8.6-50 MG PO TABS
2.0000 | ORAL_TABLET | ORAL | Status: DC
  Administered 2016-04-29 – 2016-05-01 (×2): 2 via ORAL
  Filled 2016-04-29 (×2): qty 2

## 2016-04-29 MED ORDER — SIMETHICONE 80 MG PO CHEW: 80.0000 mg | CHEWABLE_TABLET | ORAL | Status: DC | PRN

## 2016-04-29 MED ORDER — DIPHENHYDRAMINE HCL 25 MG PO CAPS: 25.0000 mg | ORAL_CAPSULE | Freq: Four times a day (QID) | ORAL | Status: DC | PRN

## 2016-04-29 MED ORDER — PRENATAL MULTIVITAMIN CH
1.0000 | ORAL_TABLET | Freq: Every day | ORAL | Status: DC
  Administered 2016-04-30 – 2016-05-01 (×2): 1 via ORAL
  Filled 2016-04-29 (×2): qty 1

## 2016-04-29 MED ORDER — ZOLPIDEM TARTRATE 5 MG PO TABS: 5.0000 mg | ORAL_TABLET | Freq: Every evening | ORAL | Status: DC | PRN

## 2016-04-29 MED ORDER — COCONUT OIL OIL
1.0000 "application " | TOPICAL_OIL | Status: DC | PRN
  Filled 2016-04-29: qty 120

## 2016-04-29 MED ORDER — IBUPROFEN 600 MG PO TABS
600.0000 mg | ORAL_TABLET | Freq: Four times a day (QID) | ORAL | Status: DC
  Administered 2016-04-29 – 2016-05-01 (×8): 600 mg via ORAL
  Filled 2016-04-29 (×8): qty 1

## 2016-04-29 MED ORDER — ONDANSETRON HCL 4 MG PO TABS: 4.0000 mg | ORAL_TABLET | ORAL | Status: DC | PRN

## 2016-04-29 MED ORDER — WITCH HAZEL-GLYCERIN EX PADS: 1.0000 "application " | MEDICATED_PAD | CUTANEOUS | Status: DC | PRN

## 2016-04-29 MED ORDER — DIBUCAINE 1 % RE OINT: 1.0000 "application " | TOPICAL_OINTMENT | RECTAL | Status: DC | PRN

## 2016-04-29 MED ORDER — TETANUS-DIPHTH-ACELL PERTUSSIS 5-2.5-18.5 LF-MCG/0.5 IM SUSP: 0.5000 mL | Freq: Once | INTRAMUSCULAR | Status: DC

## 2016-04-29 MED ORDER — BENZOCAINE-MENTHOL 20-0.5 % EX AERO
1.0000 "application " | INHALATION_SPRAY | CUTANEOUS | Status: DC | PRN
  Administered 2016-04-29: 1 via TOPICAL
  Filled 2016-04-29: qty 56

## 2016-04-29 NOTE — Lactation Note (Signed)
This note was copied from a baby's chart. Lactation Consultation Note  Patient Name: Nicole [name]: 04/29/2016 Reason for consult: Initial assessment Baby at 9 hr of life. Mom has bilateral areolar bruising. She has very short shaft to flat nipples and the bruising is between 10-11 o'clock on the areola. Mom does have very compressible breast. She was lifting the breast up, leaning over baby, and trying to shove the entire areola in the baby's mouth. Had mom lean back, bring baby to her, and sandwich the breast to achieve a comfortable asymmetrical latch. Discussed baby behavior, feeding frequency, baby belly size, voids, wt loss, breast changes, and nipple care. Demonstrated manual expression, colostrum noted bilaterally, spoon in room. Given lactation handouts. Aware of OP services and support group.     Maternal Data Has patient been taught Hand Expression?: Yes Does the patient have breastfeeding experience prior to this delivery?: No  Feeding Feeding Type: Breast Fed Length of feed: 10 min  LATCH Score/Interventions Latch: Grasps breast easily, tongue down, lips flanged, rhythmical sucking. Intervention(s): Adjust position;Breast compression  Audible Swallowing: A few with stimulation Intervention(s): Skin to skin;Alternate breast massage  Type of Nipple: Flat Intervention(s): No intervention needed  Comfort (Breast/Nipple): Filling, red/small blisters or bruises, mild/mod discomfort  Problem noted: Mild/Moderate discomfort;Cracked, bleeding, blisters, bruises Interventions  (Cracked/bleeding/bruising/blister): Expressed breast milk to nipple Interventions (Mild/moderate discomfort): Hand expression  Hold (Positioning): Assistance needed to correctly position infant at breast and maintain latch. Intervention(s): Support Pillows;Position options  LATCH Score: 6  Lactation Tools Discussed/Used WIC Program: Yes   Consult Status Consult Status:  Follow-up [name]: 04/30/16 Follow-up type: In-patient    Rulon Eisenmengerlizabeth E Tarika Mckethan 04/29/2016, 10:04 PM

## 2016-04-29 NOTE — Progress Notes (Signed)
Pt instructed she may begin pushing with ctxs. 

## 2016-04-29 NOTE — Progress Notes (Signed)
IUPC stopped functioning despite trouble shooting.  Pt able to feel ctxs to push.

## 2016-04-29 NOTE — Progress Notes (Addendum)
This note also relates to the following rows which could not be included: BP - Cannot attach notes to unvalidated device data Pulse Rate - Cannot attach notes to unvalidated device data   per dr Carolin Guernseymomaw, continue to increase pitocin as long as fetal tracing maintains good variability and no decels. Unable to place internal monitors at this time, per MD will re-eval at later time. Will continue to monitor.

## 2016-04-29 NOTE — Anesthesia Postprocedure Evaluation (Signed)
Anesthesia Post Note  Patient: Nicole [name]  Procedure(s) Performed: * No procedures listed *  Patient location during evaluation: Mother Baby Anesthesia Type: Epidural Level of consciousness: awake and alert Pain management: satisfactory to patient Vital Signs Assessment: post-procedure vital signs reviewed and stable Respiratory status: respiratory function stable Cardiovascular status: stable Postop Assessment: no headache, no backache, epidural receding, patient able to bend at knees, no signs of nausea or vomiting and adequate PO intake Anesthetic complications: no     Last Vitals:  Vitals:   04/29/16 1549 04/29/16 1900  BP: 104/68 122/75  Pulse: 80 87  Resp: 20 18  Temp: 36.6 C 36.7 C    Last Pain:  Vitals:   04/29/16 1953  TempSrc:   PainSc: 1    Pain Goal:                 [name]

## 2016-04-30 LAB — CBC
HCT: 32.7 % — ABNORMAL LOW (ref 36.0–46.0)
HEMOGLOBIN: 11.2 g/dL — AB (ref 12.0–15.0)
MCH: 30 pg (ref 26.0–34.0)
MCHC: 34.3 g/dL (ref 30.0–36.0)
MCV: 87.7 fL (ref 78.0–100.0)
PLATELETS: 132 10*3/uL — AB (ref 150–400)
RBC: 3.73 MIL/uL — ABNORMAL LOW (ref 3.87–5.11)
RDW: 14.2 % (ref 11.5–15.5)
WBC: 14.3 10*3/uL — ABNORMAL HIGH (ref 4.0–10.5)

## 2016-04-30 LAB — CCBB MATERNAL DONOR DRAW

## 2016-04-30 NOTE — Lactation Note (Addendum)
This note was copied from a baby's chart. Lactation Consultation Note: Mother having difficulty with latching. Mothers breast tissue is very soft. She was taught tea-cup hold to get infant latched. Mother complaints of pain with latch. She was fit with a #24 nipple shield. Mother states that pain less with the nipple shield. Infant had a fair latch. Sustained latch for 10-15 mins with observed colostrum in the shield. Staff nurse to sat up a DEBP to stimulate milk supply. Mother receptive to all teaching. Reviewed proper application of the nipple shield. Mother receptive to all teaching.   Patient Name: Nicole [name] Reason for consult: Follow-up assessment   Maternal Data    Feeding Feeding Type: Breast Fed  LATCH Score/Interventions Latch: Repeated attempts needed to sustain latch, nipple held in mouth throughout feeding, stimulation needed to elicit sucking reflex. Intervention(s): Adjust position;Assist with latch;Breast compression  Audible Swallowing: A few with stimulation Intervention(s): Skin to skin;Hand expression  Type of Nipple: Flat Intervention(s): Shells;Hand pump  Comfort (Breast/Nipple): Filling, red/small blisters or bruises, mild/mod discomfort  Problem noted: Cracked, bleeding, blisters, bruises;Mild/Moderate discomfort Interventions  (Cracked/bleeding/bruising/blister): Expressed breast milk to nipple;Hand pump;Double electric pump Interventions (Mild/moderate discomfort): Hand expression  Hold (Positioning): Assistance needed to correctly position infant at breast and maintain latch. Intervention(s): Support Pillows;Position options;Skin to skin  LATCH Score: 5  Lactation Tools Discussed/Used Tools: Nipple Shields Nipple shield size: 24 Pump Review: Setup, frequency, and cleaning Initiated by:: js Date initiated:: 04/30/16   Consult Status Consult Status: Follow-up Date: 05/01/16 Follow-up type:  In-patient    Stevan BornKendrick, [name], 12:26 PM

## 2016-04-30 NOTE — Progress Notes (Signed)
Post Partum Day 1  Subjective:  Nicole [name] is a 20 y.o. G1P1001 6490w3d s/p SVD  No acute events overnight.  Pt denies problems with ambulating, voiding or po intake.  She denies nausea or vomiting.  Pain is well controlled.  She has had flatus. She has had bowel movement.  Lochia Minimal.  Plan for birth control is Undecided .  Method of Feeding: Breast  Objective: BP 112/78 (BP Location: Left Arm)   Pulse 73   Temp 98 F (36.7 C) (Oral)   Resp 18   Ht 5\' 7"  (1.702 m)   Wt 90.7 kg (200 lb)   LMP 07/28/2015 (Approximate)   SpO2 98%   Breastfeeding? Unknown   BMI 31.32 kg/m   Physical Exam:  General: alert, cooperative and no distress Lochia:normal flow Chest: CTAB Heart: RRR no m/r/g Abdomen: +BS, soft, nontender, fundus firm at/below umbilicus Uterine Fundus: firm DVT Evaluation: No evidence of DVT seen on physical exam. Extremities: no lower extremity edema   Recent Labs  04/28/16 0140 04/30/16 0536  HGB 11.9* 11.2*  HCT 34.2* 32.7*    Assessment/Plan:  ASSESSMENT: Nicole AblerKatie M Menefee is a 20 y.o. G1P1001 3290w3d ppd #1 s/p NSVD/VAVD/FAVD doing well.   Plan for discharge tomorrow   LOS: 2 days   [name] 04/30/2016, 7:44 AM    OB FELLOW POSTPARTUM PROGRESS NOTE ATTESTATION  I have seen and examined this patient and agree with above documentation in the resident's note.   Jen MowElizabeth Yosgar Demirjian, DO 9:32 AM

## 2016-04-30 NOTE — Lactation Note (Signed)
This note was copied from a baby's chart. Lactation Consultation Note  Mother called for assistance w/ latching.  Baby sleeping in crib. Recommend mother call for assistance w/ latching once baby wakes up.   Patient Name: Nicole [name] NWGNF'AToday's Date: 04/30/2016 Reason for consult: Follow-up assessment   Maternal Data    Feeding Feeding Type:  (encouraged mother to call for assistance every time )  LATCH Score/Interventions       [facility] of Nipple:  [name]) Intervention(s): Shells;Hand pump  Comfort (Breast/Nipple): Filling, red/small blisters or bruises, mild/mod discomfort (several bruises over breast area)  Problem noted: Mild/Moderate discomfort Interventions  (Cracked/bleeding/bruising/blister): Expressed breast milk to nipple Interventions (Mild/moderate discomfort): Pre-pump if needed;Hand massage;Hand expression (spoon, hand expression bullet)        Lactation Tools Discussed/Used     Consult Status Consult Status: Follow-up Date: 05/01/16 Follow-up type: In-patient    Dahlia ByesBerkelhammer, Ruth [facility] 04/30/2016, 11:05 AM

## 2016-05-01 MED ORDER — IBUPROFEN 600 MG PO TABS: 600.0000 mg | ORAL_TABLET | Freq: Four times a day (QID) | ORAL | 0 refills | Status: DC

## 2016-05-01 MED ORDER — SENNOSIDES-DOCUSATE SODIUM 8.6-50 MG PO TABS: 2.0000 | ORAL_TABLET | ORAL | 0 refills | Status: DC

## 2016-05-01 NOTE — Discharge Instructions (Signed)

## 2016-05-01 NOTE — Lactation Note (Signed)
This note was copied from a baby's chart. Lactation Consultation Note: Mother breastfeeding in cradle hold on the (L) breast and has pump hooked up to (R) breast. Observed good depth with mother using a #24 nipple shield.  Mother advised to offer infant extra calories after breastfeeding. Mother states that she has been doing some spoon feeding. Mother has a DEBP at home she got from her insurance company. Discussed treatment for prevention of severe engorgement. Mother is to page for next feeding to check latch.   Patient Name: [name] ZOXWR'UToday's Date: 05/01/2016 Reason for consult: Follow-up assessment   Maternal Data    Feeding Feeding Type: Breast Fed  LATCH Score/Interventions Latch: Grasps breast easily, tongue down, lips flanged, rhythmical sucking.  Audible Swallowing: Spontaneous and intermittent Intervention(s): Hand expression;Alternate breast massage  Type of Nipple: Everted at rest and after stimulation  Comfort (Breast/Nipple): Soft / non-tender  Problem noted: Cracked, bleeding, blisters, bruises;Mild/Moderate discomfort  Hold (Positioning): No assistance needed to correctly position infant at breast. Intervention(s): Support Pillows;Position options  LATCH Score: 10  Lactation Tools Discussed/Used Tools: Nipple Shields Nipple shield size: 24   Consult Status      Stevan BornKendrick, Juley Giovanetti McCoy 05/01/2016, 11:00 AM

## 2016-05-01 NOTE — Discharge Summary (Signed)
OB Discharge Summary     Patient Name: Nicole [name] DOB: 03-09-1996 MRN: [id number]  Date of admission: 04/28/2016 Delivering [name]: Nicole [name]   Date of discharge: 05/01/2016  Admitting diagnosis: INDUCTION Intrauterine pregnancy: 5448w3d     Secondary diagnosis:  Active Problems:   IUGR (intrauterine growth restriction)   Vaginal delivery  Additional problems: None      Discharge diagnosis: Term Pregnancy Delivered                                                                                                Post partum procedures:none  Augmentation: Cytotec and Foley Balloon  Complications: None  Hospital course:  Induction of Labor With Vaginal Delivery   20 y.o. yo G1P1001 at 1648w3d was admitted to the hospital 04/28/2016 for induction of labor.  Indication for induction: IUGR.  Patient had an uncomplicated labor course as follows: Membrane Rupture Time/Date: 5:03 AM ,04/29/2016   Intrapartum Procedures: Episiotomy: None [1]                                         Lacerations:  1st degree [2];Perineal [11];Labial [10]  Patient had delivery of a Viable infant.  Information for the patient's newborn:  Nicole [name], Nicole [name] [[id number]][[id number]]  Delivery Method: Vag-Spont   04/29/2016  Details of delivery can be found in separate delivery note.  Patient had a routine postpartum course. Patient is discharged home 05/01/16.   Physical exam Vitals:   04/29/16 1900 04/30/16 0525 04/30/16 1731 05/01/16 0618  BP: 122/75 112/78 119/70 123/68  Pulse: 87 73 81 78  Resp: 18 18 16 20   Temp: 98 F (36.7 C) 98 F (36.7 C) 98.1 F (36.7 C) 98.1 F (36.7 C)  TempSrc: Oral Oral Oral Oral  SpO2: 98%     Weight:      Height:       General: alert and cooperative Lochia: appropriate Uterine Fundus: firm Incision: N/A DVT Evaluation: No evidence of DVT seen on physical exam. Labs: Lab Results  Component Value Date   WBC 14.3 (H) 04/30/2016   HGB 11.2 (L) 04/30/2016   HCT 32.7 (L)  04/30/2016   MCV 87.7 04/30/2016   PLT 132 (L) 04/30/2016   CMP Latest Ref Rng & Units 02/27/2015  Glucose 70 - 99 mg/dL 77  BUN 6 - 23 mg/dL 12  Creatinine 7.820.50 - 9.561.10 mg/dL 2.130.77  Sodium 086135 - 578145 mEq/L 140  Potassium 3.5 - 5.3 mEq/L 5.0  Chloride 96 - 112 mEq/L 105  CO2 19 - 32 mEq/L 25  Calcium 8.4 - 10.5 mg/dL 9.5  Total Protein 6.0 - 8.3 g/dL 6.3  Total Bilirubin 0.2 - 1.2 mg/dL 0.4  Alkaline Phos 39 - 117 U/L 61  AST 0 - 37 U/L 14  ALT 0 - 35 U/L 11    Discharge instruction: per After Visit Summary and "Baby and Me Booklet".  After visit meds:    Medication List    TAKE these medications  acetaminophen 325 MG tablet Commonly known as:  TYLENOL Take 650 mg by mouth daily as needed for mild pain or headache. Reported on 03/12/2016   calcium carbonate 500 MG chewable tablet Commonly known as:  TUMS - dosed in mg elemental calcium Chew 1 tablet by mouth 3 (three) times daily as needed for indigestion or heartburn.   ibuprofen 600 MG tablet Commonly known as:  ADVIL,MOTRIN Take 1 tablet (600 mg total) by mouth every 6 (six) hours.   PRENATAL VITAMIN PO Take 1 tablet by mouth at bedtime.   senna-docusate 8.6-50 MG tablet Commonly known as:  Senokot-S Take 2 tablets by mouth daily.       Diet: routine diet  Activity: Advance as tolerated. Pelvic rest for 6 weeks.   Outpatient follow up:6 weeks Follow up Appt:Future Appointments Date Time Provider Department Center  06/03/2016 9:00 AM Nicole [name] [facility]   Follow up Visit:No Follow-up on file.  Postpartum contraception: Undecided  Newborn Data: Live born female  Birth Weight: 6 lb 13.4 oz (3100 g) APGAR: 9, 9  Baby Feeding: Breast Disposition:home with mother   05/01/2016 Nicole [name]  [name] attestation I have seen and examined this patient and agree with above documentation in the resident's note.   Nicole [name] is a 20 y.o. G1P1001 s/p SVD.   Pain is well  controlled.  Plan for birth control is undecided.  Method of Feeding: breast  PE:  BP 123/68 (BP Location: Right Arm)   Pulse 78   Temp 98.1 F (36.7 C) (Oral)   Resp 20   Ht 5\' 7"  (1.702 m)   Wt 90.7 kg (200 lb)   LMP 07/28/2015 (Approximate)   SpO2 98%   Breastfeeding? Unknown   BMI 31.32 kg/m  Fundus firm   Recent Labs  04/30/16 0536  HGB 11.2*  HCT 32.7*     Plan: discharge today - postpartum care discussed - f/u clinic in 6 weeks for postpartum visit   Nicole [name] 9:27 AM  05/01/2016

## 2016-05-05 DIAGNOSIS — Z029 Encounter for administrative examinations, unspecified: Secondary | ICD-10-CM

## 2016-06-03 ENCOUNTER — Ambulatory Visit: Payer: BLUE CROSS/BLUE SHIELD | Admitting: Women's Health

## 2016-06-09 ENCOUNTER — Encounter: Payer: Self-pay | Admitting: Women's Health

## 2016-06-09 ENCOUNTER — Ambulatory Visit (INDEPENDENT_AMBULATORY_CARE_PROVIDER_SITE_OTHER): Payer: BLUE CROSS/BLUE SHIELD | Admitting: Women's Health

## 2016-06-09 DIAGNOSIS — Z8759 Personal history of other complications of pregnancy, childbirth and the puerperium: Secondary | ICD-10-CM

## 2016-06-09 HISTORY — DX: Personal history of other complications of pregnancy, childbirth and the puerperium: Z87.59

## 2016-06-09 NOTE — Patient Instructions (Signed)
NO SEX UNTIL AFTER YOU GET YOUR BIRTH CONTROL  Tips To Increase Milk Supply  Lots of water! Enough so that your urine is clear  Plenty of calories, if you're not getting enough calories, your milk supply can decrease  Breastfeed/pump often, every 2-3 hours x 20-45mins  Fenugreek 3 pills 3 times a day, this may make your urine smell like maple syrup  Mother's Milk Tea  Lactation cookies, google for the recipe  Real oatmeal   Levonorgestrel intrauterine device (IUD) What is this medicine? LEVONORGESTREL IUD (LEE voe nor jes trel) is a contraceptive (birth control) device. The device is placed inside the uterus by a healthcare professional. It is used to prevent pregnancy and can also be used to treat heavy bleeding that occurs during your period. Depending on the device, it can be used for 3 to 5 years. This medicine may be used for other purposes; ask your health care provider or pharmacist if you have questions. What should I tell my health care provider before I take this medicine? They need to know if you have any of these conditions: -abnormal Pap smear -cancer of the breast, uterus, or cervix -diabetes -endometritis -genital or pelvic infection now or in the past -have more than one sexual partner or your partner has more than one partner -heart disease -history of an ectopic or tubal pregnancy -immune system problems -IUD in place -liver disease or tumor -problems with blood clots or take blood-thinners -use intravenous drugs -uterus of unusual shape -vaginal bleeding that has not been explained -an unusual or allergic reaction to levonorgestrel, other hormones, silicone, or polyethylene, medicines, foods, dyes, or preservatives -pregnant or trying to get pregnant -breast-feeding How should I use this medicine? This device is placed inside the uterus by a health care professional. Talk to your pediatrician regarding the use of this medicine in children. Special care  may be needed. Overdosage: If you think you have taken too much of this medicine contact a poison control center or emergency room at once. NOTE: This medicine is only for you. Do not share this medicine with others. What if I miss a dose? This does not apply. What may interact with this medicine? Do not take this medicine with any of the following medications: -amprenavir -bosentan -fosamprenavir This medicine may also interact with the following medications: -aprepitant -barbiturate medicines for inducing sleep or treating seizures -bexarotene -griseofulvin -medicines to treat seizures like carbamazepine, ethotoin, felbamate, oxcarbazepine, phenytoin, topiramate -modafinil -pioglitazone -rifabutin -rifampin -rifapentine -some medicines to treat HIV infection like atazanavir, indinavir, lopinavir, nelfinavir, tipranavir, ritonavir -St. John's wort -warfarin This list may not describe all possible interactions. Give your health care provider a list of all the medicines, herbs, non-prescription drugs, or dietary supplements you use. Also tell them if you smoke, drink alcohol, or use illegal drugs. Some items may interact with your medicine. What should I watch for while using this medicine? Visit your doctor or health care professional for regular check ups. See your doctor if you or your partner has sexual contact with others, becomes HIV positive, or gets a sexual transmitted disease. This product does not protect you against HIV infection (AIDS) or other sexually transmitted diseases. You can check the placement of the IUD yourself by reaching up to the top of your vagina with clean fingers to feel the threads. Do not pull on the threads. It is a good habit to check placement after each menstrual period. Call your doctor right away if you feel more of  the IUD than just the threads or if you cannot feel the threads at all. The IUD may come out by itself. You may become pregnant if the  device comes out. If you notice that the IUD has come out use a backup birth control method like condoms and call your health care provider. Using tampons will not change the position of the IUD and are okay to use during your period. What side effects may I notice from receiving this medicine? Side effects that you should report to your doctor or health care professional as soon as possible: -allergic reactions like skin rash, itching or hives, swelling of the face, lips, or tongue -fever, flu-like symptoms -genital sores -high blood pressure -no menstrual period for 6 weeks during use -pain, swelling, warmth in the leg -pelvic pain or tenderness -severe or sudden headache -signs of pregnancy -stomach cramping -sudden shortness of breath -trouble with balance, talking, or walking -unusual vaginal bleeding, discharge -yellowing of the eyes or skin Side effects that usually do not require medical attention (report to your doctor or health care professional if they continue or are bothersome): -acne -breast pain -change in sex drive or performance -changes in weight -cramping, dizziness, or faintness while the device is being inserted -headache -irregular menstrual bleeding within first 3 to 6 months of use -nausea This list may not describe all possible side effects. Call your doctor for medical advice about side effects. You may report side effects to FDA at 1-800-FDA-1088. Where should I keep my medicine? This does not apply. NOTE: This sheet is a summary. It may not cover all possible information. If you have questions about this medicine, talk to your doctor, pharmacist, or health care provider.    2016, Elsevier/Gold Standard. (2011-09-25 13:54:04)  Etonogestrel implant What is this medicine? ETONOGESTREL (et oh noe JES trel) is a contraceptive (birth control) device. It is used to prevent pregnancy. It can be used for up to 3 years. This medicine may be used for other  purposes; ask your health care provider or pharmacist if you have questions. What should I tell my health care provider before I take this medicine? They need to know if you have any of these conditions: -abnormal vaginal bleeding -blood vessel disease or blood clots -cancer of the breast, cervix, or liver -depression -diabetes -gallbladder disease -headaches -heart disease or recent heart attack -high blood pressure -high cholesterol -kidney disease -liver disease -renal disease -seizures -tobacco smoker -an unusual or allergic reaction to etonogestrel, other hormones, anesthetics or antiseptics, medicines, foods, dyes, or preservatives -pregnant or trying to get pregnant -breast-feeding How should I use this medicine? This device is inserted just under the skin on the inner side of your upper arm by a health care professional. Talk to your pediatrician regarding the use of this medicine in children. Special care may be needed. Overdosage: If you think you have taken too much of this medicine contact a poison control center or emergency room at once. NOTE: This medicine is only for you. Do not share this medicine with others. What if I miss a dose? This does not apply. What may interact with this medicine? Do not take this medicine with any of the following medications: -amprenavir -bosentan -fosamprenavir This medicine may also interact with the following medications: -barbiturate medicines for inducing sleep or treating seizures -certain medicines for fungal infections like ketoconazole and itraconazole -griseofulvin -medicines to treat seizures like carbamazepine, felbamate, oxcarbazepine, phenytoin, topiramate -modafinil -phenylbutazone -rifampin -some medicines to treat HIV  infection like atazanavir, indinavir, lopinavir, nelfinavir, tipranavir, ritonavir -St. John's wort This list may not describe all possible interactions. Give your health care provider a list of  all the medicines, herbs, non-prescription drugs, or dietary supplements you use. Also tell them if you smoke, drink alcohol, or use illegal drugs. Some items may interact with your medicine. What should I watch for while using this medicine? This product does not protect you against HIV infection (AIDS) or other sexually transmitted diseases. You should be able to feel the implant by pressing your fingertips over the skin where it was inserted. Contact your doctor if you cannot feel the implant, and use a non-hormonal birth control method (such as condoms) until your doctor confirms that the implant is in place. If you feel that the implant may have broken or become bent while in your arm, contact your healthcare provider. What side effects may I notice from receiving this medicine? Side effects that you should report to your doctor or health care professional as soon as possible: -allergic reactions like skin rash, itching or hives, swelling of the face, lips, or tongue -breast lumps -changes in emotions or moods -depressed mood -heavy or prolonged menstrual bleeding -pain, irritation, swelling, or bruising at the insertion site -scar at site of insertion -signs of infection at the insertion site such as fever, and skin redness, pain or discharge -signs of pregnancy -signs and symptoms of a blood clot such as breathing problems; changes in vision; chest pain; severe, sudden headache; pain, swelling, warmth in the leg; trouble speaking; sudden numbness or weakness of the face, arm or leg -signs and symptoms of liver injury like dark yellow or brown urine; general ill feeling or flu-like symptoms; light-colored stools; loss of appetite; nausea; right upper belly pain; unusually weak or tired; yellowing of the eyes or skin -unusual vaginal bleeding, discharge -signs and symptoms of a stroke like changes in vision; confusion; trouble speaking or understanding; severe headaches; sudden numbness or  weakness of the face, arm or leg; trouble walking; dizziness; loss of balance or coordination Side effects that usually do not require medical attention (Report these to your doctor or health care professional if they continue or are bothersome.): -acne -back pain -breast pain -changes in weight -dizziness -general ill feeling or flu-like symptoms -headache -irregular menstrual bleeding -nausea -sore throat -vaginal irritation or inflammation This list may not describe all possible side effects. Call your doctor for medical advice about side effects. You may report side effects to FDA at 1-800-FDA-1088. Where should I keep my medicine? This drug is given in a hospital or clinic and will not be stored at home. NOTE: This sheet is a summary. It may not cover all possible information. If you have questions about this medicine, talk to your doctor, pharmacist, or health care provider.    2016, Elsevier/Gold Standard. (2014-06-09 14:07:06)

## 2016-06-09 NOTE — Progress Notes (Signed)
Subjective:    Nicole [name] is a 20 y.o. [id number] Caucasian female who presents for a postpartum visit. She is 5 weeks postpartum following a spontaneous vaginal delivery at 39.3 gestational weeks after IOL for IUGR. Anesthesia: epidural. I have fully reviewed the prenatal and intrapartum course. Postpartum course has been uncomplicated. Baby's course has been uncomplicated. Baby is feeding by breast and bottle- feels doesn't have enough milk. Bleeding no bleeding. Bowel function is normal. Bladder function is normal. Patient is not sexually active. Last sexual activity: prior to birth of baby. Contraception method is undecided- discussed options- wants either nexplanon or IUD. Postpartum depression screening: negative. Score 6.  Last pap 10/05/15 and was neg.  The following portions of the patient's history were reviewed and updated as appropriate: allergies, current medications, past medical history, past surgical history and problem list.  Review of Systems Pertinent items are noted in HPI.   Vitals:   06/09/16 1045  BP: 118/72  Pulse: 72  Weight: 184 lb (83.5 kg)   No LMP recorded.  Objective:   General:  alert, cooperative and no distress   Breasts:  deferred, no complaints  Lungs: clear to auscultation bilaterally  Heart:  regular rate and rhythm  Abdomen: soft, nontender   Vulva: normal  Vagina: normal vagina  Cervix:  closed  Corpus: Well-involuted  Adnexa:  Non-palpable  Rectal Exam: No hemorrhoids        Assessment:   Postpartum exam 5 wks s/p SVB after IOL for IUGR Breast/bottlefeeding w/ decreased supply Depression screening Contraception counseling   Plan:  Gave printed tips to help increase milk supply Contraception: abstinence until birth control- call to let [place] know what she decides Follow up in: will call when she decides which birth control   Marge DuncansBooker, Doretha Goding Randall CNM, [facility] 06/09/2016 11:10 AM

## 2016-07-11 ENCOUNTER — Ambulatory Visit (INDEPENDENT_AMBULATORY_CARE_PROVIDER_SITE_OTHER): Payer: BLUE CROSS/BLUE SHIELD | Admitting: Women's Health

## 2016-07-11 ENCOUNTER — Encounter: Payer: Self-pay | Admitting: Women's Health

## 2016-07-11 VITALS — BP 102/58 | HR 76 | Wt 183.0 lb

## 2016-07-11 DIAGNOSIS — Z975 Presence of (intrauterine) contraceptive device: Secondary | ICD-10-CM

## 2016-07-11 DIAGNOSIS — Z30014 Encounter for initial prescription of intrauterine contraceptive device: Secondary | ICD-10-CM | POA: Diagnosis not present

## 2016-07-11 DIAGNOSIS — Z3202 Encounter for pregnancy test, result negative: Secondary | ICD-10-CM | POA: Diagnosis not present

## 2016-07-11 DIAGNOSIS — Z30430 Encounter for insertion of intrauterine contraceptive device: Secondary | ICD-10-CM | POA: Diagnosis not present

## 2016-07-11 HISTORY — DX: Encounter for insertion of intrauterine contraceptive device: Z30.430

## 2016-07-11 LAB — POCT URINE PREGNANCY: Preg Test, Ur: NEGATIVE

## 2016-07-11 NOTE — Progress Notes (Signed)
Nicole AblerKatie M [name] is a 20 y.o. year old [id number] Caucasian female who presents for placement of a Mirena IUD. She had SVB on 04/29/16.   No LMP recorded. BP (!) 102/58 (BP Location: Right Arm, Patient Position: Sitting, Cuff Size: Normal)   Pulse 76   Wt 183 lb (83 kg)   Breastfeeding? Yes   BMI 28.66 kg/m  Last sexual intercourse was prior to birth of baby, and pregnancy test today was neg  The risks and benefits of the method and placement have been thouroughly reviewed with the patient and all questions were answered.  Specifically the patient is aware of failure rate of 09/998, expulsion of the IUD and of possible perforation.  The patient is aware of irregular bleeding due to the method and understands the incidence of irregular bleeding diminishes with time.  Signed copy of informed consent in chart.   Time out was performed.  A graves speculum was placed in the vagina.  The cervix was visualized, prepped using Betadine, and grasped with a single tooth tenaculum. The uterus was found to be retroflexed and it sounded to 8 cm.  Mirena IUD placed per manufacturer's recommendations.   The strings were trimmed to 3 cm.  Sonogram was performed and the proper placement of the IUD was verified via transvaginal u/s.   The patient was given post procedure instructions, including signs and symptoms of infection and to check for the strings after each menses or each month, and refraining from intercourse or anything in the vagina for 3 days.  She was given a Mirena care card with date IUD placed, and date IUD to be removed.  She is scheduled for a f/u appointment in 4 weeks.  Marge DuncansBooker, Elana Jian Randall CNM, [facility] 07/11/2016 1:17 PM

## 2016-07-11 NOTE — Patient Instructions (Signed)
 Nothing in vagina for 3 days (no sex, douching, tampons, etc...)  Check your strings once a month to make sure you can feel them, if you are not able to please let us know  If you develop a fever of 100.4 or more in the next few weeks, or if you develop severe abdominal pain, please let us know  Use a backup method of birth control, such as condoms, for 2 weeks    Intrauterine Device Insertion, Care After Refer to this sheet in the next few weeks. These instructions provide you with information on caring for yourself after your procedure. Your health care provider may also give you more specific instructions. Your treatment has been planned according to current medical practices, but problems sometimes occur. Call your health care provider if you have any problems or questions after your procedure. WHAT TO EXPECT AFTER THE PROCEDURE Insertion of the IUD may cause some discomfort, such as cramping. The cramping should improve after the IUD is in place. You may have bleeding after the procedure. This is normal. It varies from light spotting for a few days to menstrual-like bleeding. When the IUD is in place, a string will extend past the cervix into the vagina for 1-2 inches. The strings should not bother you or your partner. If they do, talk to your health care provider.  HOME CARE INSTRUCTIONS   Check your intrauterine device (IUD) to make sure it is in place before you resume sexual activity. You should be able to feel the strings. If you cannot feel the strings, something may be wrong. The IUD may have fallen out of the uterus, or the uterus may have been punctured (perforated) during placement. Also, if the strings are getting longer, it may mean that the IUD is being forced out of the uterus. You no longer have full protection from pregnancy if any of these problems occur.  You may resume sexual intercourse if you are not having problems with the IUD. The copper IUD is considered immediately  effective, and the hormone IUD works right away if inserted within 7 days of your period starting. You will need to use a backup method of birth control for 7 days if the IUD in inserted at any other time in your cycle.  Continue to check that the IUD is still in place by feeling for the strings after every menstrual period.  You may need to take pain medicine such as acetaminophen or ibuprofen. Only take medicines as directed by your health care provider. SEEK MEDICAL CARE IF:   You have bleeding that is heavier or lasts longer than a normal menstrual cycle.  You have a fever.  You have increasing cramps or abdominal pain not relieved with medicine.  You have abdominal pain that does not seem to be related to the same area of earlier cramping and pain.  You are lightheaded, unusually weak, or faint.  You have abnormal vaginal discharge or smells.  You have pain during sexual intercourse.  You cannot feel the IUD strings, or the IUD string has gotten longer.  You feel the IUD at the opening of the cervix in the vagina.  You think you are pregnant, or you miss your menstrual period.  The IUD string is hurting your sex partner. MAKE SURE YOU:  Understand these instructions.  Will watch your condition.  Will get help right away if you are not doing well or get worse.   This information is not intended to replace   advice given to you by your health care provider. Make sure you discuss any questions you have with your health care provider.   Document Released: 04/23/2011 Document Revised: 06/15/2013 Document Reviewed: 02/13/2013 Elsevier Interactive Patient Education 2016 Elsevier Inc.  Levonorgestrel intrauterine device (IUD) What is this medicine? LEVONORGESTREL IUD (LEE voe nor jes trel) is a contraceptive (birth control) device. The device is placed inside the uterus by a healthcare professional. It is used to prevent pregnancy and can also be used to treat heavy bleeding  that occurs during your period. Depending on the device, it can be used for 3 to 5 years. This medicine may be used for other purposes; ask your health care provider or pharmacist if you have questions. What should I tell my health care provider before I take this medicine? They need to know if you have any of these conditions: -abnormal Pap smear -cancer of the breast, uterus, or cervix -diabetes -endometritis -genital or pelvic infection now or in the past -have more than one sexual partner or your partner has more than one partner -heart disease -history of an ectopic or tubal pregnancy -immune system problems -IUD in place -liver disease or tumor -problems with blood clots or take blood-thinners -use intravenous drugs -uterus of unusual shape -vaginal bleeding that has not been explained -an unusual or allergic reaction to levonorgestrel, other hormones, silicone, or polyethylene, medicines, foods, dyes, or preservatives -pregnant or trying to get pregnant -breast-feeding How should I use this medicine? This device is placed inside the uterus by a health care professional. Talk to your pediatrician regarding the use of this medicine in children. Special care may be needed. Overdosage: If you think you have taken too much of this medicine contact a poison control center or emergency room at once. NOTE: This medicine is only for you. Do not share this medicine with others. What if I miss a dose? This does not apply. What may interact with this medicine? Do not take this medicine with any of the following medications: -amprenavir -bosentan -fosamprenavir This medicine may also interact with the following medications: -aprepitant -barbiturate medicines for inducing sleep or treating seizures -bexarotene -griseofulvin -medicines to treat seizures like carbamazepine, ethotoin, felbamate, oxcarbazepine, phenytoin,  topiramate -modafinil -pioglitazone -rifabutin -rifampin -rifapentine -some medicines to treat HIV infection like atazanavir, indinavir, lopinavir, nelfinavir, tipranavir, ritonavir -St. John's wort -warfarin This list may not describe all possible interactions. Give your health care provider a list of all the medicines, herbs, non-prescription drugs, or dietary supplements you use. Also tell them if you smoke, drink alcohol, or use illegal drugs. Some items may interact with your medicine. What should I watch for while using this medicine? Visit your doctor or health care professional for regular check ups. See your doctor if you or your partner has sexual contact with others, becomes HIV positive, or gets a sexual transmitted disease. This product does not protect you against HIV infection (AIDS) or other sexually transmitted diseases. You can check the placement of the IUD yourself by reaching up to the top of your vagina with clean fingers to feel the threads. Do not pull on the threads. It is a good habit to check placement after each menstrual period. Call your doctor right away if you feel more of the IUD than just the threads or if you cannot feel the threads at all. The IUD may come out by itself. You may become pregnant if the device comes out. If you notice that the IUD has come out   use a backup birth control method like condoms and call your health care provider. Using tampons will not change the position of the IUD and are okay to use during your period. What side effects may I notice from receiving this medicine? Side effects that you should report to your doctor or health care professional as soon as possible: -allergic reactions like skin rash, itching or hives, swelling of the face, lips, or tongue -fever, flu-like symptoms -genital sores -high blood pressure -no menstrual period for 6 weeks during use -pain, swelling, warmth in the leg -pelvic pain or tenderness -severe or  sudden headache -signs of pregnancy -stomach cramping -sudden shortness of breath -trouble with balance, talking, or walking -unusual vaginal bleeding, discharge -yellowing of the eyes or skin Side effects that usually do not require medical attention (report to your doctor or health care professional if they continue or are bothersome): -acne -breast pain -change in sex drive or performance -changes in weight -cramping, dizziness, or faintness while the device is being inserted -headache -irregular menstrual bleeding within first 3 to 6 months of use -nausea This list may not describe all possible side effects. Call your doctor for medical advice about side effects. You may report side effects to FDA at 1-800-FDA-1088. Where should I keep my medicine? This does not apply. NOTE: This sheet is a summary. It may not cover all possible information. If you have questions about this medicine, talk to your doctor, pharmacist, or health care provider.    2016, Elsevier/Gold Standard. (2011-09-25 13:54:04)   

## 2016-07-15 ENCOUNTER — Ambulatory Visit: Payer: BLUE CROSS/BLUE SHIELD | Admitting: Adult Health

## 2016-08-08 ENCOUNTER — Encounter: Payer: Self-pay | Admitting: Adult Health

## 2016-08-08 ENCOUNTER — Ambulatory Visit (INDEPENDENT_AMBULATORY_CARE_PROVIDER_SITE_OTHER): Payer: BLUE CROSS/BLUE SHIELD | Admitting: Adult Health

## 2016-08-08 VITALS — BP 100/52 | HR 68 | Ht 67.0 in | Wt 190.0 lb

## 2016-08-08 DIAGNOSIS — N926 Irregular menstruation, unspecified: Secondary | ICD-10-CM

## 2016-08-08 DIAGNOSIS — N938 Other specified abnormal uterine and vaginal bleeding: Secondary | ICD-10-CM | POA: Diagnosis not present

## 2016-08-08 DIAGNOSIS — Z30431 Encounter for routine checking of intrauterine contraceptive device: Secondary | ICD-10-CM | POA: Diagnosis not present

## 2016-08-08 DIAGNOSIS — Z975 Presence of (intrauterine) contraceptive device: Secondary | ICD-10-CM | POA: Insufficient documentation

## 2016-08-08 HISTORY — DX: Irregular menstruation, unspecified: N92.6

## 2016-08-08 MED ORDER — MEGESTROL ACETATE 40 MG PO TABS: ORAL_TABLET | ORAL | 1 refills | Status: DC

## 2016-08-08 NOTE — Progress Notes (Addendum)
Subjective:     Patient ID: Nicole [name], female   DOB: 04-Dec-1995, 20 y.o.   MRN: [id number]  HPI Nicole AddisonKatie is a 20 year old white female in for IUD check, had inserted 11/3 and has had bleeding light then heavier since. Partner can feel strings at times.She says she has gained some weight since getting, about 7 lbs. She is pumping about every few days, baby on formula too.  Review of Systems +bleeding since got IUD, light then heavier +weight gain Reviewed past medical,surgical, social and family history. Reviewed medications and allergies.     Objective:   Physical Exam BP (!) 100/52 (BP Location: Left Arm, Patient Position: Sitting, Cuff Size: Normal)   Pulse 68   Ht 5\' 7"  (1.702 m)   Wt 190 lb (86.2 kg)   Breastfeeding? Yes   BMI 29.76 kg/m   PHQ 2 score 0.Skin warm and dry.Pelvic: external genitalia is normal in appearance no lesions, vagina: period like blood, without odor,urethra has no lesions or masses noted, cervix:smooth and bulbous,+IUD strings, uterus: normal size, shape and contour, non tender, no masses felt, adnexa: no masses or tenderness noted. Bladder is non tender and no masses felt.IUD appears to be in place by abdominal US.    Assessment:     1. IUD (intrauterine device) in place   2. Irregular bleeding       Plan:      Rx megace 40 mg #45 3 x 5 days then 2 x 5 days then 1 daily with 1 refill Take megace til bleeding stops Pump and dump while on megace Follow up in 3 weeks to assess bleeding and weight

## 2016-08-08 NOTE — Patient Instructions (Signed)
Take megace til bleeding stops Pump and dump while on megace Follow up in 3 weeks

## 2016-08-20 ENCOUNTER — Ambulatory Visit (INDEPENDENT_AMBULATORY_CARE_PROVIDER_SITE_OTHER): Payer: BLUE CROSS/BLUE SHIELD | Admitting: Obstetrics and Gynecology

## 2016-08-20 ENCOUNTER — Encounter: Payer: Self-pay | Admitting: Obstetrics and Gynecology

## 2016-08-20 VITALS — BP 100/60 | HR 80 | Ht 67.0 in | Wt 189.0 lb

## 2016-08-20 DIAGNOSIS — R635 Abnormal weight gain: Secondary | ICD-10-CM

## 2016-08-20 DIAGNOSIS — Z3009 Encounter for other general counseling and advice on contraception: Secondary | ICD-10-CM | POA: Diagnosis not present

## 2016-08-20 DIAGNOSIS — Z3202 Encounter for pregnancy test, result negative: Secondary | ICD-10-CM | POA: Diagnosis not present

## 2016-08-20 DIAGNOSIS — Z308 Encounter for other contraceptive management: Secondary | ICD-10-CM | POA: Diagnosis not present

## 2016-08-20 DIAGNOSIS — Z30431 Encounter for routine checking of intrauterine contraceptive device: Secondary | ICD-10-CM | POA: Insufficient documentation

## 2016-08-20 DIAGNOSIS — Z30432 Encounter for removal of intrauterine contraceptive device: Secondary | ICD-10-CM | POA: Diagnosis not present

## 2016-08-20 HISTORY — DX: Encounter for routine checking of intrauterine contraceptive device: Z30.431

## 2016-08-20 LAB — POCT URINE PREGNANCY: PREG TEST UR: NEGATIVE

## 2016-08-20 MED ORDER — NORGESTIMATE-ETH ESTRADIOL 0.25-35 MG-MCG PO TABS: ORAL_TABLET | ORAL | 11 refills | Status: DC

## 2016-08-20 NOTE — Progress Notes (Addendum)
[facility] Visit  @DATE @            Patient name: Nicole [name] MRN [id number]  Date of birth: 1996-08-04  CC & HPI:  Nicole [name] is a 20 y.o. female presenting today for Discussion of contraception plans. She is dissatisfied with her IUD and is considering having it removed. She had her IUD placed when she was not on her menstrual period, a few weeks after she delivered. She had normal post delivery lochia.having a period, was not breast-feeding and then at some later date had the IUD placed 2 describes herself as having very little patient's now due to the childcare responsibilities. She is somewhat suspicious that the IUD contributed to her 6 pound weight fluctuations I would explain to her the multiple factors that may be entering into her breakthrough bleeding, specifically the placement of the IUD when she is not on her menses, the immediate postpartum status and the fact that is not uncommon for the body to take a while to adjust to the continuous IUD hormones. After listening to alternatives she desires have the IUD removed and resume oral contraceptives used in the past. I found that she was on a norgestimate containing birth control pill mo linea in the past and she is restarted on the pill  ROS:  ROS   Pertinent History Reviewed:   Reviewed: Significant for Weight gain since delivery Medical         Past Medical History:  Diagnosis Date  . ADD (attention deficit disorder)   . BV (bacterial vaginosis) 03/18/2013   Treat ed with flagyl  . Contraceptive management 05/29/2015  . Supervision of normal pregnancy in first trimester 10/05/2015    [facility]  Initiated Care at   9+6 weeks  FOB  Dorian HeckleJonathan Brazzel 20 yo WM  Dating By  LMP and US  Pap  10/05/15 negative +yeast  GC/CT Initial:     -/-           36+wks:  Genetic Screen AFP  CF screen   1 variant detected ; FOB declines testing  Anatomic US   Flu vaccine  10/05/15  Tdap Recommended ~ 28wks  Glucose Screen  2 hr   GBS   Feed Preference   Contraception   Circumcision   Childbirth Classes   Pediatrician      . Vertigo                               Surgical Hx:   History reviewed. No pertinent surgical history. Medications: Reviewed & Updated - see associated section                       Current Outpatient Prescriptions:  .  ibuprofen (ADVIL,MOTRIN) 600 MG tablet, Take 1 tablet (600 mg total) by mouth every 6 (six) hours., Disp: 60 tablet, Rfl: 0 .  levonorgestrel (MIRENA) 20 MCG/24HR IUD, 1 each by Intrauterine route once., Disp: , Rfl:  .  acetaminophen (TYLENOL) 325 MG tablet, Take 650 mg by mouth daily as needed for mild pain or headache. Reported on 03/12/2016, Disp: , Rfl:  .  megestrol (MEGACE) 40 MG tablet, take 3 x 5 days then 2 x 5 days then 1 daily with 1 refill (Patient not taking: Reported on 08/20/2016), Disp: 45 tablet, Rfl: 1 .  norgestimate-ethinyl estradiol (MONO-LINYAH) 0.25-35 MG-MCG tablet, TAKE ONE (1) TABLET EACH  DAY, Disp: 28 tablet, Rfl: 11   Social History: Reviewed -  reports that she has never smoked. She has never used smokeless tobacco.  Objective Findings:  Vitals: Blood pressure 100/60, pulse 80, height 5\' 7"  (1.702 m), weight 189 lb (85.7 kg), currently breastfeeding.  Physical Examination: General appearance - alert, well appearing, and in no distress, oriented to person, place, and time, overweight and well hydrated Mental status - alert, oriented to person, place, and time, normal mood, behavior, speech, dress, motor activity, and thought processes, affect appropriate to mood Abdomen - soft, nontender, nondistended, no masses or organomegaly Pelvic - normal external genitalia, vulva, vagina, cervix, uterus and adnexa, see IUD removal note   Assessment & Plan:   A:  1. Contraception management, IUD removal and initiation of oral contraceptive  P:  1. IUD removed, and patient begun on the molinea oral contraceptive     [facility] PROCEDURE  NOTE  Nicole [name] is a 20 y.o. G1P1001 here for MirenaLiletta IUD removal. No GYN concerns.  Last pap smear was normal on 10/05/15. Patient has had for 1-1.5 months and is not happy. She states she has persistent bleeding since insertion but denies filling prescription for Megace as she did not feel like trying it. She also attributes ~6 lb weight fluctuations since insertion. She states [facility].   IUD Removal  Patient was in the dorsal lithotomy position, normal external genitalia was noted.  A speculum was placed in the patient's vagina, normal discharge was noted, no lesions. The multiparous cervix was visualized, no lesions, no abnormal discharge;  and the cervix was swabbed with Betadine using scopettes. The strings of the IUD were grasped and pulled using ring forceps.  The IUD was successfully removed in its entirety.   Patient tolerated the procedure well.    Future contraception By signing my name below, I, Sonum Patel, attest that this documentation has been prepared under the direction and in the presence of Tilda BurrowJohn V Preslie Depasquale, MD. Electronically Signed: Sonum Patel, Neurosurgeoncribe. 08/20/16. 3:28 PM.  I personally performed the services described in this documentation, which was SCRIBED in my presence. The recorded information has been reviewed and considered accurate. It has been edited as necessary during review. Tilda BurrowFERGUSON,Jaia Alonge V, MD

## 2016-11-19 ENCOUNTER — Ambulatory Visit (INDEPENDENT_AMBULATORY_CARE_PROVIDER_SITE_OTHER): Payer: BLUE CROSS/BLUE SHIELD | Admitting: Physician Assistant

## 2016-11-19 ENCOUNTER — Encounter: Payer: Self-pay | Admitting: Physician Assistant

## 2016-11-19 VITALS — BP 102/82 | HR 84 | Temp 98.2°F | Resp 16 | Wt 191.2 lb

## 2016-11-19 DIAGNOSIS — H00011 Hordeolum externum right upper eyelid: Secondary | ICD-10-CM | POA: Diagnosis not present

## 2016-11-19 MED ORDER — BACITRACIN-POLYMYXIN B 500-10000 UNIT/GM OP OINT: TOPICAL_OINTMENT | OPHTHALMIC | 0 refills | Status: DC

## 2016-11-19 NOTE — Progress Notes (Signed)
    Patient ID: Nicole AblerKatie M [name] MRN: [id number], DOB: June 10, 1996, 21 y.o. Date of Encounter: 11/19/2016, 9:44 AM    Chief Complaint:  Chief Complaint  Patient presents with  . right eye irritation    x1day     HPI: 21 y.o. year old female presents with above.   Just this morning she noticed something with her right eye. It feels kind of scratchy and irritated. Had small amount of drainage in the eye when she woke up this morning. Has had no foreign object to the eye. No nasal congestion or other symptoms. No fevers or chills.     Home Meds:   Outpatient Medications Prior to Visit  Medication Sig Dispense Refill  . acetaminophen (TYLENOL) 325 MG tablet Take 650 mg by mouth daily as needed for mild pain or headache. Reported on 03/12/2016    . ibuprofen (ADVIL,MOTRIN) 600 MG tablet Take 1 tablet (600 mg total) by mouth every 6 (six) hours. 60 tablet 0  . levonorgestrel (MIRENA) 20 MCG/24HR IUD 1 each by Intrauterine route once.    . norgestimate-ethinyl estradiol (MONO-LINYAH) 0.25-35 MG-MCG tablet TAKE ONE (1) TABLET EACH DAY 28 tablet 11  . megestrol (MEGACE) 40 MG tablet take 3 x 5 days then 2 x 5 days then 1 daily with 1 refill (Patient not taking: Reported on 08/20/2016) 45 tablet 1   No facility-administered medications prior to visit.     Allergies: No Known Allergies    Review of Systems: See HPI for pertinent ROS. All other ROS negative.    Physical Exam: Blood pressure 102/82, pulse 84, temperature 98.2 F (36.8 C), temperature source Oral, resp. rate 16, weight 191 lb 3.2 oz (86.7 kg), last menstrual period 11/12/2016, SpO2 98 %, not currently breastfeeding., Body mass index is 29.95 kg/m. General:  WNWD WF. Appears in no acute distress. HEENT: Right Eye: Conjunctive normal with no injection. Upper eye lid with very small ( ~501mm) external hordeolum. Remainder of eye exam normal. Neck: Supple. No thyromegaly. No lymphadenopathy. Lungs: Clear bilaterally to  auscultation without wheezes, rales, or rhonchi. Breathing is unlabored. Heart: Regular rhythm. No murmurs, rubs, or gallops. Msk:  Strength and tone normal for age. Extremities/Skin: Warm and dry.  Neuro: Alert and oriented X 3. Moves all extremities spontaneously. Gait is normal. CNII-XII grossly in tact. Psych:  Responds to questions appropriately with a normal affect.     ASSESSMENT AND PLAN:  21 y.o. year old female with  1. Hordeolum externum of right upper eyelid Every 3 hours she is to apply warm compress, then apply this ointment--for 7 days. F/U if site worsens or if it is not resolved in 7 days. - bacitracin-polymyxin b (POLYSPORIN) ophthalmic ointment; Apply every 3 hours while awake  Dispense: 3.5 g; Refill: 0   Signed, [address] Malina Beth [place], [place], [facility] 11/19/2016 9:44 AM

## 2017-07-25 ENCOUNTER — Other Ambulatory Visit: Payer: Self-pay | Admitting: Obstetrics and Gynecology

## 2017-07-28 ENCOUNTER — Telehealth: Payer: Self-pay | Admitting: Physician Assistant

## 2017-07-28 NOTE — Telephone Encounter (Signed)
Patient cannot get in touch with her gyno, and needs refill on her birth control if possible, can we do this for her? [phone]  [facility]

## 2017-07-28 NOTE — Telephone Encounter (Signed)
Patient has not been seen since 11/19/16 and another provider is filling the rx.Patient is aware that if she wants Shon HaleMary Beth to fill the rx then she would need to be seen to get a new rx .Patient was instructed to call obgyn for refill

## 2017-07-29 ENCOUNTER — Telehealth: Payer: Self-pay | Admitting: Adult Health

## 2017-07-29 MED ORDER — NORGESTIMATE-ETH ESTRADIOL 0.25-35 MG-MCG PO TABS: ORAL_TABLET | ORAL | 0 refills | Status: DC

## 2017-07-29 NOTE — Telephone Encounter (Signed)
Attempted to call pt to notify her that a [facility] month [facility]. She will need to schedule an appt for a physical before we give any refills after this. Unable to leave message.

## 2017-08-04 NOTE — Telephone Encounter (Signed)
Request delayed in reaching inbasket. Taken care of thru other contacts with pt. 

## 2017-08-11 ENCOUNTER — Ambulatory Visit: Payer: BLUE CROSS/BLUE SHIELD | Admitting: Adult Health

## 2017-08-11 ENCOUNTER — Encounter: Payer: Self-pay | Admitting: Adult Health

## 2017-08-11 VITALS — BP 120/80 | HR 98 | Ht 68.0 in | Wt 172.0 lb

## 2017-08-11 DIAGNOSIS — Z3041 Encounter for surveillance of contraceptive pills: Secondary | ICD-10-CM | POA: Diagnosis not present

## 2017-08-11 DIAGNOSIS — Z3202 Encounter for pregnancy test, result negative: Secondary | ICD-10-CM | POA: Diagnosis not present

## 2017-08-11 LAB — POCT URINE PREGNANCY: Preg Test, Ur: NEGATIVE

## 2017-08-11 MED ORDER — NORGESTIMATE-ETH ESTRADIOL 0.25-35 MG-MCG PO TABS: ORAL_TABLET | ORAL | 12 refills | Status: DC

## 2017-08-11 NOTE — Progress Notes (Signed)
Subjective:     Patient ID: Nicole [name], female   DOB: 09-Jul-1996, 21 y.o.   MRN: [id number]  HPI Nicole [name] is a 21 year old white female in to get OCs refilled, had normal pap 10/05/15.   Review of Systems Patient denies any headaches, hearing loss, fatigue, blurred vision, shortness of breath, chest pain, abdominal pain, problems with bowel movements, urination, or intercourse. No joint pain or mood swings. Reviewed past medical,surgical, social and family history. Reviewed medications and allergies.     Objective:   Physical Exam BP 120/80 (BP Location: Left Arm, Patient Position: Sitting, Cuff Size: Small)   Pulse 98   Ht 5\' 8"  (1.727 m)   Wt 172 lb (78 kg)   LMP 07/20/2017 (Approximate)   BMI 26.15 kg/m UPT negative,Skin warm and dry. Neck: mid line trachea, normal thyroid, good ROM, no lymphadenopathy noted. Lungs: clear to ausculation bilaterally. Cardiovascular: regular rate and rhythm.   PHQ 9 score 3 denies any depression.  Assessment:     1. Encounter for surveillance of contraceptive pills   2. Pregnancy examination or test, negative result       Plan:     Meds ordered this encounter  Medications  . norgestimate-ethinyl estradiol (MONO-LINYAH) 0.25-35 MG-MCG tablet    Sig: TAKE ONE (1) TABLET EACH DAY    Dispense:  28 tablet    Refill:  12    Order Specific Question:   Supervising Provider    Answer:   Lazaro ArmsEURE, LUTHER H [[id number]]  Return in 1 year for physical  Pap in 2020

## 2017-10-21 ENCOUNTER — Ambulatory Visit: Payer: BLUE CROSS/BLUE SHIELD | Admitting: Physician Assistant

## 2017-10-21 ENCOUNTER — Other Ambulatory Visit: Payer: Self-pay

## 2017-10-21 ENCOUNTER — Encounter: Payer: Self-pay | Admitting: Physician Assistant

## 2017-10-21 VITALS — BP 112/82 | HR 90 | Temp 97.5°F | Resp 14 | Wt 162.4 lb

## 2017-10-21 DIAGNOSIS — Z569 Unspecified problems related to employment: Secondary | ICD-10-CM | POA: Diagnosis not present

## 2017-10-21 NOTE — Progress Notes (Signed)
    Patient ID: Nicole [name] MRN: [id number], DOB: [birth year], 22 y.o. Date of Encounter: 10/21/2017, 4:15 PM    Chief Complaint:  Chief Complaint  Patient presents with  . letter for [employer] cleared of ADD     HPI: 22 y.o. year old female presents for above.   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  Needs a letter stating and documenting that she has been stable and controlled without medication for ADD since 2013.  She states that medication for ADD had been prescribed to her in the past by her pediatrician, Dr. Stephania FragminHelm.  When she had tried to get records from him, his office no longer had the records available.  She has also contacted her pharmacy to get records of Rxes.  Today I have reviewed our office records.  The only office note at our office that mentions ADD was her initial visit at this office to establish care and that visit was with me on 02/01/2013.  That office note stated the following: "History of ADD: Was on med for this when in school.  Graduated high school 2013.  Not working, not in school.  Says if she goes back to college, then she may need to restart medicine, but for now she does not need medication."  Today we have also reviewed the [facility] drug [facility].  This registry would show any controlled medicine that has been prescribed to her for the past 2 years.  Nothing shows up for her in this registry.     Home Meds:   Outpatient Medications Prior to Visit  Medication Sig Dispense Refill  . ibuprofen (ADVIL,MOTRIN) 600 MG tablet Take 1 tablet (600 mg total) by mouth every 6 (six) hours. 60 tablet 0  . norgestimate-ethinyl estradiol (MONO-LINYAH) 0.25-35 MG-MCG tablet TAKE ONE (1) TABLET EACH DAY 28 tablet 12  . levonorgestrel (MIRENA) 20 MCG/24HR IUD 1 each by Intrauterine route once.     No facility-administered medications prior to visit.     Allergies: No  Known Allergies    Review of Systems: See HPI for pertinent ROS. All other ROS negative.    Physical Exam: Blood pressure 112/82, pulse 90, temperature (!) 97.5 F (36.4 C), temperature source Oral, resp. rate 14, weight 73.7 kg (162 lb 6.4 oz), last menstrual period 10/14/2017, SpO2 98 %, not currently breastfeeding., Body mass index is 24.69 kg/m. General:  WNWD WF. Appears in no acute distress. Neck: Supple. No thyromegaly. No lymphadenopathy. Lungs: Clear bilaterally to auscultation without wheezes, rales, or rhonchi. Breathing is unlabored. Heart: Regular rhythm. No murmurs, rubs, or gallops. Msk:  Strength and tone normal for age. Extremities/Skin: Warm and dry.  Neuro: Alert and oriented X 3. Moves all extremities spontaneously. Gait is normal. CNII-XII grossly in tact. Psych:  Responds to questions appropriately with a normal affect.     ASSESSMENT AND PLAN:  22 y.o. year old female with   Letter was printed and given to her today at time of her visit. Letter states that she currently does not need medication for ADD and that she has been stable/controlled without medication for ADD since 2013.  [address]igned, Nicole [name], [place], [facility] 10/21/2017 4:15 PM

## 2017-11-15 ENCOUNTER — Encounter (HOSPITAL_COMMUNITY): Payer: Self-pay | Admitting: *Deleted

## 2017-11-15 ENCOUNTER — Other Ambulatory Visit: Payer: Self-pay

## 2017-11-15 ENCOUNTER — Emergency Department (HOSPITAL_COMMUNITY)
Admission: EM | Admit: 2017-11-15 | Discharge: 2017-11-16 | Disposition: A | Discharge status: Home / Self Care | Payer: BLUE CROSS/BLUE SHIELD | Attending: Emergency Medicine | Admitting: Emergency Medicine

## 2017-11-15 DIAGNOSIS — F329 Major depressive disorder, single episode, unspecified: Secondary | ICD-10-CM | POA: Diagnosis not present

## 2017-11-15 DIAGNOSIS — F4321 Adjustment disorder with depressed mood: Secondary | ICD-10-CM

## 2017-11-15 DIAGNOSIS — R45851 Suicidal ideations: Secondary | ICD-10-CM | POA: Diagnosis not present

## 2017-11-15 DIAGNOSIS — R450 Nervousness: Secondary | ICD-10-CM | POA: Diagnosis not present

## 2017-11-15 DIAGNOSIS — F4320 Adjustment disorder, unspecified: Secondary | ICD-10-CM | POA: Diagnosis not present

## 2017-11-15 DIAGNOSIS — F32A Depression, unspecified: Secondary | ICD-10-CM

## 2017-11-15 DIAGNOSIS — X789XXA Intentional self-harm by unspecified sharp object, initial encounter: Secondary | ICD-10-CM | POA: Insufficient documentation

## 2017-11-15 NOTE — ED Triage Notes (Signed)
Pt reports that she feels "mentally unstable." Pt denies si/hi and states she wouldn't have the guts to do that but she is certain that her mental state is off due to a friend committing suicide in October and her grandmother passing away recently. During these deaths, her husband was away at basic training.

## 2017-11-15 NOTE — ED Notes (Signed)
WHEN PT WALKED OUT OF TRIAGE, HER HUSBAND STATED TO ME THAT HE WAS CONCERNED FOR HIS WIFE BECAUSE LATELY, SHE HAS BEEN TALKING ABOUT THINGS SHE WASN'T BRAVE ENOUGH OR HAD THE GUTS TO DO. THE HUSBAND STATES HE THOUGHT HE SAW CUT MARKS ON HER HAND.
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
## 2017-11-16 DIAGNOSIS — F329 Major depressive disorder, single episode, unspecified: Secondary | ICD-10-CM

## 2017-11-16 LAB — COMPREHENSIVE METABOLIC PANEL
ALBUMIN: 3.7 g/dL (ref 3.5–5.0)
ALT: 21 U/L (ref 14–54)
ANION GAP: 11 (ref 5–15)
AST: 14 U/L — AB (ref 15–41)
Alkaline Phosphatase: 54 U/L (ref 38–126)
BUN: 14 mg/dL (ref 6–20)
CHLORIDE: 105 mmol/L (ref 101–111)
CO2: 24 mmol/L (ref 22–32)
Calcium: 9.2 mg/dL (ref 8.9–10.3)
Creatinine, Ser: 0.73 mg/dL (ref 0.44–1.00)
GFR calc Af Amer: >60 mL/min (ref >60)
GFR calc non Af Amer: >60 mL/min (ref >60)
GLUCOSE: 100 mg/dL — AB (ref 65–99)
POTASSIUM: 3.9 mmol/L (ref 3.5–5.1)
SODIUM: 140 mmol/L (ref 135–145)
Total Bilirubin: 0.6 mg/dL (ref 0.3–1.2)
Total Protein: 6.6 g/dL (ref 6.5–8.1)

## 2017-11-16 LAB — CBC WITH DIFFERENTIAL/PLATELET
BASOS ABS: 0 10*3/uL (ref 0.0–0.1)
BASOS PCT: 0 %
EOS ABS: 0.1 10*3/uL (ref 0.0–0.7)
EOS PCT: 1 %
HEMATOCRIT: 40.6 % (ref 36.0–46.0)
Hemoglobin: 13.2 g/dL (ref 12.0–15.0)
Lymphocytes Relative: 41 %
Lymphs Abs: 3.5 10*3/uL (ref 0.7–4.0)
MCH: 30.3 pg (ref 26.0–34.0)
MCHC: 32.5 g/dL (ref 30.0–36.0)
MCV: 93.1 fL (ref 78.0–100.0)
MONO ABS: 0.7 10*3/uL (ref 0.1–1.0)
MONOS PCT: 8 %
NEUTROS ABS: 4.3 10*3/uL (ref 1.7–7.7)
Neutrophils Relative %: 50 %
PLATELETS: 194 10*3/uL (ref 150–400)
RBC: 4.36 MIL/uL (ref 3.87–5.11)
RDW: 12.4 % (ref 11.5–15.5)
WBC: 8.5 10*3/uL (ref 4.0–10.5)

## 2017-11-16 LAB — URINALYSIS, ROUTINE W REFLEX MICROSCOPIC
Bilirubin Urine: NEGATIVE
Glucose, UA: NEGATIVE mg/dL
HGB URINE DIPSTICK: NEGATIVE
KETONES UR: NEGATIVE mg/dL
LEUKOCYTES UA: NEGATIVE
Nitrite: NEGATIVE
PROTEIN: NEGATIVE mg/dL
Specific Gravity, Urine: 1.018 (ref 1.005–1.030)
pH: 5 (ref 5.0–8.0)

## 2017-11-16 LAB — RAPID URINE DRUG SCREEN, HOSP PERFORMED
Amphetamines: NOT DETECTED
BARBITURATES: NOT DETECTED
BENZODIAZEPINES: NOT DETECTED
Cocaine: NOT DETECTED
Opiates: NOT DETECTED
TETRAHYDROCANNABINOL: NOT DETECTED

## 2017-11-16 LAB — ETHANOL: Alcohol, Ethyl (B): <10 mg/dL (ref <10)

## 2017-11-16 LAB — PREGNANCY, URINE: PREG TEST UR: NEGATIVE

## 2017-11-16 NOTE — ED Notes (Signed)
Pt given personal belongings and cell phone.

## 2017-11-16 NOTE — ED Notes (Signed)
TTS in progress 

## 2017-11-16 NOTE — ED Notes (Signed)
Pt given breakfast tray

## 2017-11-16 NOTE — ED Provider Notes (Signed)
[facility] EMERGENCY DEPARTMENT Provider Note   CSN: [id number] Arrival date & time: 11/15/17  2147     History   Chief Complaint Chief Complaint  Patient presents with  . V70.1    HPI Nicole [name] is a 22 y.o. female.  The history is provided by the patient and the spouse. No language interpreter was used.  Mental Health Problem  Presenting symptoms: depression, self-mutilation and suicidal thoughts   Patient accompanied by:  Family member Degree of incapacity (severity):  Moderate Onset quality:  Gradual Timing:  Constant Progression:  Worsening Chronicity:  New Context: stressful life event   Context: not alcohol use, not drug abuse and not medication   Relieved by:  Nothing Worsened by:  Nothing Ineffective treatments:  None tried Associated symptoms: anxiety   Associated symptoms: no chest pain and no insomnia    Pt reports she had a friend commit suicide.  Pt reports she is also grieving the loss of a grandparent.  Pt feels like she needs help  Past Medical History:  Diagnosis Date  . ADD (attention deficit disorder)   . BV (bacterial vaginosis) 03/18/2013   Treat ed with flagyl  . Contraceptive management 05/29/2015  . Supervision of normal pregnancy in first trimester 10/05/2015    [facility]  Initiated Care at   9+6 weeks  FOB  Dorian Heckle 22 yo WM  Dating By  LMP and [place]  Pap  10/05/15 negative +yeast  GC/CT Initial:     -/-           36+wks:  Genetic Screen AFP  CF screen   1 variant detected ; FOB declines testing  Anatomic [place]   Flu vaccine  10/05/15  Tdap Recommended ~ 28wks  Glucose Screen  2 hr  GBS   Feed Preference   Contraception   Circumcision   Childbirth Classes   Pediatrician      . Vertigo     Patient Active Problem List   Diagnosis Date Noted  . Encounter for management of intrauterine contraceptive device (IUD) 08/20/2016  . IUD (intrauterine device) in place 08/08/2016  . Irregular bleeding 08/08/2016  . Encounter for insertion  of mirena IUD 07/11/2016  . History of prior pregnancy with IUGR newborn 06/09/2016  . 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 BV (bacterial vaginosis) 03/18/2013  . ADD (attention deficit disorder) 12/22/2012  . Vertigo 12/22/2012    History reviewed. No pertinent surgical history.  OB History    Gravida Para Term Preterm AB Living   1 1 1  0 0 1   SAB TAB Ectopic Multiple Live Births   0 0 0 0 1       Home Medications    Prior to Admission medications   Medication Sig Start Date End Date Taking? Authorizing Provider  ibuprofen (ADVIL,MOTRIN) 600 MG tablet Take 1 tablet (600 mg total) by mouth every 6 (six) hours. 05/01/16   Berton Bon, MD  norgestimate-ethinyl estradiol [facility]) 0.25-35 MG-MCG tablet TAKE ONE (1) TABLET EACH DAY 08/11/17   Adline Potter, NP    Family History Family History  Problem Relation Age of Onset  . Glaucoma Maternal Grandmother   . Hypertension Maternal Grandmother   . Anxiety disorder Maternal Grandmother   . Deafness Mother   . Arthritis Mother   . Hyperlipidemia Mother   . Dementia Paternal Grandmother   . COPD Maternal Grandfather   . Heart attack Father   . Deafness Father  Social History Social History   Tobacco Use  . Smoking status: Never Smoker  . Smokeless tobacco: Never Used  Substance Use Topics  . Alcohol use: No    Alcohol/week: 0.0 oz    Comment: every weekend; not now  . Drug use: No     Allergies   Patient has no known allergies.   Review of Systems Review of Systems  Cardiovascular: Negative for chest pain.  Psychiatric/Behavioral: Positive for self-injury and suicidal ideas. The patient is nervous/anxious. The patient does not have insomnia.   All other systems reviewed and are negative.    Physical Exam Updated Vital Signs BP (!) 145/98 (BP Location: Right Arm)   Pulse 83   Temp (!) 97.3 F (36.3 C) (Oral)   Resp 16   Ht 5\' 7"  (1.702 m)   Wt 73.9 kg (163 lb)   LMP  11/11/2017   SpO2 99%   BMI 25.53 kg/m   Physical Exam  Constitutional: She is oriented to person, place, and time. She appears well-developed and well-nourished.  HENT:  Head: Normocephalic.  Eyes: EOM are normal.  Neck: Normal range of motion.  Pulmonary/Chest: Effort normal.  Abdominal: She exhibits no distension.  Musculoskeletal: Normal range of motion.  Neurological: She is alert and oriented to person, place, and time.  Psychiatric: She has a normal mood and affect.  Nursing note and vitals reviewed.    ED Treatments / Results  Labs (all labs ordered are listed, but only abnormal results are displayed) Labs Reviewed - No data to display  EKG  EKG Interpretation None       Radiology No results found.  Procedures Procedures (including critical care time)  Medications Ordered in ED Medications - No data to display   Initial Impression / Assessment and Plan / ED Course  I have reviewed the triage vital signs and the nursing notes.  Pertinent labs & imaging results that were available during my care of the patient were reviewed by me and considered in my medical decision making (see chart for details).     TTS consulted and advised monitor overnight and reassess.  Final Clinical Impressions(s) / ED Diagnoses   Final diagnoses:  Depression, unspecified depression type    ED Discharge Orders    None       Osie CheeksSofia, Leslie K, PA-C 11/16/17 Iverson [id number]    Miller, Brian, MD 11/17/17 [phone]
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
## 2017-11-25 DIAGNOSIS — F322 Major depressive disorder, single episode, severe without psychotic features: Secondary | ICD-10-CM | POA: Diagnosis not present

## 2017-11-25 DIAGNOSIS — F909 Attention-deficit hyperactivity disorder, unspecified type: Secondary | ICD-10-CM | POA: Diagnosis not present

## 2017-12-01 DIAGNOSIS — F322 Major depressive disorder, single episode, severe without psychotic features: Secondary | ICD-10-CM | POA: Diagnosis not present

## 2017-12-01 DIAGNOSIS — F909 Attention-deficit hyperactivity disorder, unspecified type: Secondary | ICD-10-CM | POA: Diagnosis not present

## 2017-12-09 DIAGNOSIS — F322 Major depressive disorder, single episode, severe without psychotic features: Secondary | ICD-10-CM | POA: Diagnosis not present

## 2017-12-09 DIAGNOSIS — F909 Attention-deficit hyperactivity disorder, unspecified type: Secondary | ICD-10-CM | POA: Diagnosis not present

## 2017-12-15 DIAGNOSIS — F909 Attention-deficit hyperactivity disorder, unspecified type: Secondary | ICD-10-CM | POA: Diagnosis not present

## 2017-12-15 DIAGNOSIS — F322 Major depressive disorder, single episode, severe without psychotic features: Secondary | ICD-10-CM | POA: Diagnosis not present

## 2017-12-31 DIAGNOSIS — F322 Major depressive disorder, single episode, severe without psychotic features: Secondary | ICD-10-CM | POA: Diagnosis not present

## 2017-12-31 DIAGNOSIS — F909 Attention-deficit hyperactivity disorder, unspecified type: Secondary | ICD-10-CM | POA: Diagnosis not present

## 2018-01-13 DIAGNOSIS — F321 Major depressive disorder, single episode, moderate: Secondary | ICD-10-CM | POA: Diagnosis not present

## 2018-01-13 DIAGNOSIS — F909 Attention-deficit hyperactivity disorder, unspecified type: Secondary | ICD-10-CM | POA: Diagnosis not present

## 2018-01-21 DIAGNOSIS — F909 Attention-deficit hyperactivity disorder, unspecified type: Secondary | ICD-10-CM | POA: Diagnosis not present

## 2018-01-27 ENCOUNTER — Telehealth: Payer: Self-pay | Admitting: *Deleted

## 2018-01-27 NOTE — Telephone Encounter (Signed)
Pt wants to see if we can start her on anti-depressants. Told pt that we would have to schedule an appointment for her.  Pt did not want to wait until our first available.  Pt said that she was going to call to get an appointment sooner with someone else.    01-27-18  AS

## 2018-01-27 NOTE — Telephone Encounter (Signed)
Called pt back to inform her that [facility] could probably see her without a referral and get her in ASAP.  Pt voiced understanding.  01-27-18  AS

## 2018-01-28 DIAGNOSIS — F321 Major depressive disorder, single episode, moderate: Secondary | ICD-10-CM | POA: Diagnosis not present

## 2018-01-28 DIAGNOSIS — F909 Attention-deficit hyperactivity disorder, unspecified type: Secondary | ICD-10-CM | POA: Diagnosis not present

## 2018-02-03 ENCOUNTER — Ambulatory Visit: Payer: BLUE CROSS/BLUE SHIELD | Admitting: Physician Assistant

## 2018-02-04 DIAGNOSIS — F909 Attention-deficit hyperactivity disorder, unspecified type: Secondary | ICD-10-CM | POA: Diagnosis not present

## 2018-02-04 DIAGNOSIS — F321 Major depressive disorder, single episode, moderate: Secondary | ICD-10-CM | POA: Diagnosis not present

## 2018-02-11 DIAGNOSIS — F909 Attention-deficit hyperactivity disorder, unspecified type: Secondary | ICD-10-CM | POA: Diagnosis not present

## 2018-02-11 DIAGNOSIS — F321 Major depressive disorder, single episode, moderate: Secondary | ICD-10-CM | POA: Diagnosis not present

## 2018-02-18 DIAGNOSIS — F321 Major depressive disorder, single episode, moderate: Secondary | ICD-10-CM | POA: Diagnosis not present

## 2018-02-18 DIAGNOSIS — F909 Attention-deficit hyperactivity disorder, unspecified type: Secondary | ICD-10-CM | POA: Diagnosis not present

## 2018-02-23 DIAGNOSIS — F3181 Bipolar II disorder: Secondary | ICD-10-CM | POA: Diagnosis not present

## 2018-03-04 DIAGNOSIS — F909 Attention-deficit hyperactivity disorder, unspecified type: Secondary | ICD-10-CM | POA: Diagnosis not present

## 2018-03-04 DIAGNOSIS — F321 Major depressive disorder, single episode, moderate: Secondary | ICD-10-CM | POA: Diagnosis not present

## 2018-03-18 DIAGNOSIS — F320 Major depressive disorder, single episode, mild: Secondary | ICD-10-CM | POA: Diagnosis not present

## 2018-03-18 DIAGNOSIS — F909 Attention-deficit hyperactivity disorder, unspecified type: Secondary | ICD-10-CM | POA: Diagnosis not present

## 2018-03-30 DIAGNOSIS — F332 Major depressive disorder, recurrent severe without psychotic features: Secondary | ICD-10-CM | POA: Diagnosis not present

## 2018-04-01 DIAGNOSIS — F909 Attention-deficit hyperactivity disorder, unspecified type: Secondary | ICD-10-CM | POA: Diagnosis not present

## 2018-04-01 DIAGNOSIS — F320 Major depressive disorder, single episode, mild: Secondary | ICD-10-CM | POA: Diagnosis not present

## 2018-04-22 DIAGNOSIS — F320 Major depressive disorder, single episode, mild: Secondary | ICD-10-CM | POA: Diagnosis not present

## 2018-04-22 DIAGNOSIS — F909 Attention-deficit hyperactivity disorder, unspecified type: Secondary | ICD-10-CM | POA: Diagnosis not present

## 2018-04-27 DIAGNOSIS — F3181 Bipolar II disorder: Secondary | ICD-10-CM | POA: Diagnosis not present

## 2018-05-13 DIAGNOSIS — F320 Major depressive disorder, single episode, mild: Secondary | ICD-10-CM | POA: Diagnosis not present

## 2018-05-13 DIAGNOSIS — F909 Attention-deficit hyperactivity disorder, unspecified type: Secondary | ICD-10-CM | POA: Diagnosis not present

## 2018-07-23 ENCOUNTER — Ambulatory Visit: Payer: BLUE CROSS/BLUE SHIELD | Admitting: Family Medicine

## 2018-07-23 ENCOUNTER — Other Ambulatory Visit: Payer: Self-pay

## 2018-07-23 ENCOUNTER — Encounter: Payer: Self-pay | Admitting: Family Medicine

## 2018-07-23 VITALS — BP 118/62 | HR 82 | Temp 98.1°F | Resp 12 | Ht 68.0 in | Wt 165.0 lb

## 2018-07-23 DIAGNOSIS — R634 Abnormal weight loss: Secondary | ICD-10-CM

## 2018-07-23 DIAGNOSIS — G4700 Insomnia, unspecified: Secondary | ICD-10-CM | POA: Diagnosis not present

## 2018-07-23 DIAGNOSIS — F902 Attention-deficit hyperactivity disorder, combined type: Secondary | ICD-10-CM

## 2018-07-23 MED ORDER — AMPHETAMINE-DEXTROAMPHET ER 10 MG PO CP24: 10.0000 mg | ORAL_CAPSULE | ORAL | 0 refills | Status: DC

## 2018-07-23 NOTE — Patient Instructions (Addendum)
I will call you with lab results - after I check that we can see how to help with insomnia.  The ADHD meds may help you with your mornings  Follow up in one month  Notify me sooner if you need to increase the dose after 2 weeks

## 2018-07-23 NOTE — Progress Notes (Signed)
Patient ID: Nicole [name], female    DOB: 08-13-1996, 22 y.o.   MRN: [id number]  PCP: Dorena Bodo, PA-C  Chief Complaint  Patient presents with  . ADHD    Subjective:   Nicole [name] is a 22 y.o. female, presents to clinic with CC of wanting to start taking ADHD meds again.  I have reviewed previous encounters with her PCP, she did stop taking medicine several years ago but recently she has had worsening inattentive and hyperactivity that is making her job difficult, making it difficult at home.  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  She is also very forgetful with tasks and planning.  She forgets appointments and she is forgetting tasks at work that she is never omitted before.  She does have trouble focusing her thoughts and does jump from thought to thought, she is also hyperactive she says it is very hard for her to hold still and she fidgets a lot.  She was previously on Vyvanse and Concerta, she is had ADHD since she was a child.  She did recently go to a therapist to talk about depressive symptoms, this was about 8 months ago.  They did put her on Wellbutrin, she saw Dr. Shela Commons a psychiatrist and her therapist is in [place] and is still going to it for outpatient therapy.  She has had a 30 pound weight loss in 8 to 10 months she is having a lot of difficulty getting to sleep and she is had increased looser bowel movements.'  She filled out the adult ADHD self-report scale and symptoms checklist, 6+ positive in part A- all results in chart   Patient Active Problem List   Diagnosis Date Noted  . Depression   . Encounter for management of intrauterine contraceptive device (IUD) 08/20/2016  . IUD (intrauterine device) in place 08/08/2016  . Irregular  bleeding 08/08/2016  . Encounter for insertion of mirena IUD 07/11/2016  . History of prior pregnancy with IUGR newborn 06/09/2016  . 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 BV (bacterial vaginosis) 03/18/2013  . ADD (attention deficit disorder) 12/22/2012  . Vertigo 12/22/2012     Prior to Admission medications   Medication Sig Start Date End Date Taking? Authorizing Provider  ibuprofen (ADVIL,MOTRIN) 600 MG tablet Take 1 tablet (600 mg total) by mouth every 6 (six) hours. 05/01/16  Yes Mikell, Antionette Poles, MD  norgestimate-ethinyl estradiol [facility]) 0.25-35 MG-MCG tablet TAKE ONE (1) TABLET EACH DAY 08/11/17  Yes Cyril Mourning A, NP  amphetamine-dextroamphetamine (ADDERALL XR) 10 MG 24 hr capsule Take 1 capsule (10 mg total) by mouth every morning. 07/23/18   Danelle Berry, PA-C     No Known Allergies   Family History  Problem Relation Age of Onset  . Glaucoma Maternal Grandmother   . Hypertension Maternal Grandmother   . Anxiety disorder Maternal Grandmother   . Deafness Mother   . Arthritis Mother   . Hyperlipidemia Mother   . Dementia Paternal Grandmother   . COPD Maternal Grandfather   . Heart attack Father   . Deafness Father      Social History   Socioeconomic History  . Marital status: Married    Spouse name: Not on file  .  Number of children: Not on file  . Years of education: Not on file  . Highest education level: Not on file  Occupational History  . Not on file  Social Needs  . Financial resource strain: Not on file  . Food insecurity:    Worry: Not on file    Inability: Not on file  . Transportation needs:    Medical: Not on file    Non-medical: Not on file  Tobacco Use  . Smoking status: Never Smoker  . Smokeless tobacco: Never Used  Substance and Sexual Activity  . Alcohol use: No    Alcohol/week: 0.0 standard drinks    Comment: every weekend; not now  . Drug use: No  . Sexual activity: Yes    Birth control/protection:  Pill  Lifestyle  . Physical activity:    Days per week: Not on file    Minutes per session: Not on file  . Stress: Not on file  Relationships  . Social connections:    Talks on phone: Not on file    Gets together: Not on file    Attends religious service: Not on file    Active member of club or organization: Not on file    Attends meetings of clubs or organizations: Not on file    Relationship status: Not on file  . Intimate partner violence:    Fear of current or ex partner: Not on file    Emotionally abused: Not on file    Physically abused: Not on file    Forced sexual activity: Not on file  Other Topics Concern  . Not on file  Social History Narrative  . Not on file     Review of Systems  Constitutional: Negative for activity change, appetite change, chills, diaphoresis, fatigue and fever.  HENT: Negative.   Eyes: Negative.   Respiratory: Negative.  Negative for shortness of breath.   Cardiovascular: Negative.  Negative for chest pain, palpitations and leg swelling.  Gastrointestinal: Negative.  Negative for abdominal pain and blood in stool.  Endocrine: Negative.  Negative for cold intolerance, heat intolerance, polydipsia, polyphagia and polyuria.  Genitourinary: Negative.   Musculoskeletal: Negative.  Negative for arthralgias, gait problem, joint swelling and myalgias.  Skin: Negative.  Negative for color change, pallor and rash.  Allergic/Immunologic: Negative.   Neurological: Negative.  Negative for syncope and weakness.  Hematological: Negative.   Psychiatric/Behavioral: Positive for decreased concentration and sleep disturbance. Negative for behavioral problems, confusion, dysphoric mood, hallucinations, self-injury and suicidal ideas. The patient is hyperactive. The patient is not nervous/anxious.        Objective:    Vitals:   07/23/18 1412  BP: 118/62  Pulse: 82  Resp: 12  Temp: 98.1 F (36.7 C)  TempSrc: Oral  SpO2: 98%  Weight: 165 lb (74.8 kg)    Height: 5\' 8"  (1.727 m)      Physical Exam  Constitutional: She is oriented to person, place, and time. She appears well-developed and well-nourished.  Non-toxic appearance. No distress.  HENT:  Head: Normocephalic and atraumatic.  Right Ear: External ear normal.  Left Ear: External ear normal.  Nose: Nose normal.  Mouth/Throat: Uvula is midline and mucous membranes are normal. Oropharyngeal exudate present.  Eyes: Pupils are equal, round, and reactive to light. Conjunctivae, EOM and lids are normal.  Neck: Normal range of motion and phonation normal. Neck supple. No tracheal deviation present. No thyromegaly present.  Cardiovascular: Normal rate, regular rhythm, normal heart sounds and normal pulses. Exam  reveals no gallop and no friction rub.  No murmur heard. Pulses:      Radial pulses are 2+ on the right side, and 2+ on the left side.       Posterior tibial pulses are 2+ on the right side, and 2+ on the left side.  Pulmonary/Chest: Effort normal and breath sounds normal. No stridor. No respiratory distress. She has no wheezes. She has no rhonchi. She has no rales. She exhibits no tenderness.  Abdominal: Soft. Normal appearance and bowel sounds are normal. She exhibits no distension. There is no tenderness. There is no rebound and no guarding.  Musculoskeletal: Normal range of motion. She exhibits no edema or deformity.  Lymphadenopathy:    She has no cervical adenopathy.  Neurological: She is alert and oriented to person, place, and time. She exhibits normal muscle tone. Coordination and gait normal.  Skin: Skin is warm, dry and intact. Capillary refill takes less than 2 seconds. No rash noted. She is not diaphoretic. No pallor.  Psychiatric: She has a normal mood and affect. Judgment and thought content normal. Her speech is rapid and/or pressured. She is hyperactive. She is not agitated, not aggressive, not slowed, not withdrawn and not combative. Cognition and memory are normal.   Nursing note and vitals reviewed.         Assessment & Plan:      ICD-10-CM   1. Attention deficit hyperactivity disorder (ADHD), combined type F90.2 TSH    T3, Free    T4, Free    amphetamine-dextroamphetamine (ADDERALL XR) 10 MG 24 hr capsule  2. Insomnia, unspecified type G47.00 TSH    T3, Free    T4, Free  3. Unintentional weight loss R63.4 TSH    T3, Free    T4, Free    CBC with Differential    COMPLETE METABOLIC PANEL WITH GFR    Check labs to make sure there is no thyroid dysfunction Has a history of ADHD verifiable in chart, and ASRS-v1.1 is highly positive, will restart ADHD medications Follow up in one month  Advised to keep going to therapy Try melatonin and sleep hygiene for insomnia  Danelle BerryLeisa Jaiden Dinkins, PA-C 07/23/18 2:45 PM

## 2018-07-24 LAB — T4, FREE: Free T4: 1.1 ng/dL (ref 0.8–1.8)

## 2018-07-24 LAB — T3, FREE: T3 FREE: 2.8 pg/mL (ref 2.3–4.2)

## 2018-07-24 LAB — CBC WITH DIFFERENTIAL/PLATELET
BASOS PCT: 0.3 %
Basophils Absolute: 23 cells/uL (ref 0–200)
EOS ABS: 39 {cells}/uL (ref 15–500)
EOS PCT: 0.5 %
HCT: 39.9 % (ref 35.0–45.0)
HEMOGLOBIN: 13.5 g/dL (ref 11.7–15.5)
Lymphs Abs: 2613 cells/uL (ref 850–3900)
MCH: 30.5 pg (ref 27.0–33.0)
MCHC: 33.8 g/dL (ref 32.0–36.0)
MCV: 90.3 fL (ref 80.0–100.0)
MONOS PCT: 6.5 %
MPV: 12.1 fL (ref 7.5–12.5)
NEUTROS ABS: 4618 {cells}/uL (ref 1500–7800)
Neutrophils Relative %: 59.2 %
PLATELETS: 204 10*3/uL (ref 140–400)
RBC: 4.42 10*6/uL (ref 3.80–5.10)
RDW: 12.1 % (ref 11.0–15.0)
TOTAL LYMPHOCYTE: 33.5 %
WBC mixed population: 507 cells/uL (ref 200–950)
WBC: 7.8 10*3/uL (ref 3.8–10.8)

## 2018-07-24 LAB — COMPLETE METABOLIC PANEL WITH GFR
AG Ratio: 1.8 (calc) (ref 1.0–2.5)
ALBUMIN MSPROF: 4.1 g/dL (ref 3.6–5.1)
ALT: 16 U/L (ref 6–29)
AST: 18 U/L (ref 10–30)
Alkaline phosphatase (APISO): 51 U/L (ref 33–115)
BUN: 13 mg/dL (ref 7–25)
CALCIUM: 9.1 mg/dL (ref 8.6–10.2)
CO2: 27 mmol/L (ref 20–32)
CREATININE: 0.89 mg/dL (ref 0.50–1.10)
Chloride: 105 mmol/L (ref 98–110)
GFR, Est African American: 105 mL/min/{1.73_m2} (ref >=60)
GFR, Est Non African American: 91 mL/min/{1.73_m2} (ref >=60)
GLUCOSE: 90 mg/dL (ref 65–99)
Globulin: 2.3 g/dL (calc) (ref 1.9–3.7)
POTASSIUM: 4.2 mmol/L (ref 3.5–5.3)
SODIUM: 139 mmol/L (ref 135–146)
TOTAL PROTEIN: 6.4 g/dL (ref 6.1–8.1)
Total Bilirubin: 0.4 mg/dL (ref 0.2–1.2)

## 2018-07-24 LAB — TSH: TSH: 0.82 mIU/L

## 2018-07-30 DIAGNOSIS — F909 Attention-deficit hyperactivity disorder, unspecified type: Secondary | ICD-10-CM | POA: Diagnosis not present

## 2018-08-04 ENCOUNTER — Telehealth: Payer: Self-pay | Admitting: Physician Assistant

## 2018-08-04 NOTE — Telephone Encounter (Signed)
Can you please help with this call.    ]Please call pt and ask what dose she was taking and how she felt initially on 10 mg.  Meds were prescribed 12 days ago, #30.  So even if she increased the dose, she should still have some left for a few days.   Please remind her that all medication refills should allow for 72 hour turn around time.    Thank you

## 2018-08-04 NOTE — Telephone Encounter (Signed)
[place] [facility]  Patient needs refill on her adderall

## 2018-08-07 ENCOUNTER — Other Ambulatory Visit: Payer: Self-pay | Admitting: Family Medicine

## 2018-08-07 DIAGNOSIS — F902 Attention-deficit hyperactivity disorder, combined type: Secondary | ICD-10-CM

## 2018-08-09 NOTE — Telephone Encounter (Signed)
Called and spoke to pt and she states that she was confused as to what you wanted her to do so she doubled up for 20mg  and then realized that she would not have enough to last and went back to taking the 10mg . 20mg  worked much better for her and would like a rx for the 20mg .

## 2018-08-10 ENCOUNTER — Other Ambulatory Visit: Payer: Self-pay | Admitting: Family Medicine

## 2018-08-10 MED ORDER — AMPHETAMINE-DEXTROAMPHET ER 20 MG PO CP24: 20.0000 mg | ORAL_CAPSULE | ORAL | 0 refills | Status: DC

## 2018-08-10 NOTE — Progress Notes (Signed)
20 mg XR rx prescribed, will need one month follow up

## 2018-08-10 NOTE — Telephone Encounter (Signed)
20 mg XR prescribed - she will need a one month follow up appt before any refills

## 2018-08-11 ENCOUNTER — Encounter: Payer: Self-pay | Admitting: Family Medicine

## 2018-08-11 NOTE — Telephone Encounter (Signed)
Pt aware via vm 

## 2018-08-17 ENCOUNTER — Ambulatory Visit: Payer: BLUE CROSS/BLUE SHIELD | Admitting: Family Medicine

## 2018-08-17 DIAGNOSIS — R0789 Other chest pain: Secondary | ICD-10-CM | POA: Diagnosis not present

## 2018-08-17 DIAGNOSIS — M940 Chondrocostal junction syndrome [Tietze]: Secondary | ICD-10-CM | POA: Diagnosis not present

## 2018-08-18 ENCOUNTER — Encounter: Payer: Self-pay | Admitting: Physician Assistant

## 2018-08-27 ENCOUNTER — Ambulatory Visit: Payer: BLUE CROSS/BLUE SHIELD | Admitting: Family Medicine

## 2018-08-30 ENCOUNTER — Encounter: Payer: Self-pay | Admitting: Adult Health

## 2018-08-30 ENCOUNTER — Ambulatory Visit: Payer: BLUE CROSS/BLUE SHIELD | Admitting: Adult Health

## 2018-08-30 VITALS — BP 121/77 | HR 95 | Ht 67.0 in | Wt 162.8 lb

## 2018-08-30 DIAGNOSIS — Z3041 Encounter for surveillance of contraceptive pills: Secondary | ICD-10-CM | POA: Diagnosis not present

## 2018-08-30 DIAGNOSIS — N6310 Unspecified lump in the right breast, unspecified quadrant: Secondary | ICD-10-CM | POA: Diagnosis not present

## 2018-08-30 HISTORY — DX: Encounter for surveillance of contraceptive pills: Z30.41

## 2018-08-30 MED ORDER — NORGESTIMATE-ETH ESTRADIOL 0.25-35 MG-MCG PO TABS: ORAL_TABLET | ORAL | 12 refills | Status: DC

## 2018-08-30 NOTE — Progress Notes (Signed)
Patient ID: Nicole AblerKatie M [name], female   DOB: [birth year], 22 y.o.   MRN: [id number] History of Present Illness: Nicole [name] is a 22 year old white female, G1P1,in needing refill on OCs and has right breast knot, that is tender. It feels smaller than it was. [place]CP [place] Nicole [name] ,[place]   Current Medications, Allergies, Past Medical History, Past Surgical History, Family History and Social History were reviewed in [facility] electronic medical record.     Review of Systems: Patient denies any headaches, hearing loss, fatigue, blurred vision, shortness of breath, chest pain, abdominal pain, problems with bowel movements, urination, or intercourse. No joint pain or mood swings. Right breast mass    Physical Exam:BP 121/77 (BP Location: Right Arm, Patient Position: Sitting, Cuff Size: Normal)   Pulse 95   Ht 5\' 7"  (1.702 m)   Wt 162 lb 12.8 oz (73.8 kg)   LMP 08/28/2018   BMI 25.50 kg/m  General:  Well developed, well nourished, no acute distress Skin:  Warm and dry, has tattoos Breast:  No dominant palpable mass, retraction, or nipple discharge on the left, has bilateral breast rods, on right, no retraction or nipple discharge but has pea sized mass at 12 o'clock on edge of areola, that is mobile and tender, she has regular irregularities in both breasts. Psych:  No mood changes, alert and cooperative,seems happy Fall risk is low.  Impression:  1. Breast mass, right   2. Encounter for surveillance of contraceptive pills      Plan: Meds ordered this encounter  Medications  . norgestimate-ethinyl estradiol (MONO-LINYAH) 0.25-35 MG-MCG tablet    Sig: TAKE ONE (1) TABLET EACH DAY    Dispense:  28 tablet    Refill:  12    Order Specific Question:   Supervising Provider    Answer:   Nicole [name], LUTHER H [[id number]]  Right [facility] Return in 1 year for pap and physical

## 2018-09-07 ENCOUNTER — Encounter: Payer: Self-pay | Admitting: Physician Assistant

## 2018-09-10 DIAGNOSIS — F909 Attention-deficit hyperactivity disorder, unspecified type: Secondary | ICD-10-CM | POA: Diagnosis not present

## 2018-09-14 ENCOUNTER — Ambulatory Visit (HOSPITAL_COMMUNITY)
Admission: RE | Admit: 2018-09-14 | Discharge: 2018-09-14 | Disposition: A | Discharge status: Home / Self Care | Payer: BLUE CROSS/BLUE SHIELD | Source: Ambulatory Visit | Attending: Adult Health | Admitting: Adult Health

## 2018-09-14 DIAGNOSIS — N6310 Unspecified lump in the right breast, unspecified quadrant: Secondary | ICD-10-CM | POA: Insufficient documentation

## 2018-09-14 DIAGNOSIS — N6489 Other specified disorders of breast: Secondary | ICD-10-CM | POA: Diagnosis not present

## 2018-10-29 ENCOUNTER — Encounter: Payer: Self-pay | Admitting: Family Medicine

## 2018-10-29 ENCOUNTER — Ambulatory Visit: Payer: BLUE CROSS/BLUE SHIELD | Admitting: Family Medicine

## 2018-10-29 VITALS — BP 110/74 | HR 74 | Temp 98.7°F | Ht 68.0 in | Wt 160.1 lb

## 2018-10-29 DIAGNOSIS — F902 Attention-deficit hyperactivity disorder, combined type: Secondary | ICD-10-CM | POA: Diagnosis not present

## 2018-10-29 DIAGNOSIS — F329 Major depressive disorder, single episode, unspecified: Secondary | ICD-10-CM | POA: Diagnosis not present

## 2018-10-29 MED ORDER — AMPHETAMINE-DEXTROAMPHET ER 20 MG PO CP24: 20.0000 mg | ORAL_CAPSULE | ORAL | 0 refills | Status: DC

## 2018-10-29 NOTE — Progress Notes (Signed)
Patient ID: Nicole AblerKatie M [name], female    DOB: 25-Mar-1996, 23 y.o.   MRN: [id number]  PCP: Danelle Berryapia, Josceline Chenard, PA-C  Chief Complaint  Patient presents with  . ADHD    Patient states in for a refill on ADHD medications. States took for 1 month would like to restart    Subjective:   Nicole [name] is a 23 y.o. female, presents to clinic with CC of revisit for ADHD, recently restarting medication, 07/23/2018 had first visit with me after transferring care from past PCP in our office who left practice, we restarted at 10 mg extended release Adderall and she increased a few weeks later to 20 mg XR.   She is seting an alarm at 6 am so that she can wake up and get going, because she was really having difficulty getting to work on time.  She was also having difficulty completing tasks, being forgetful.  Her ADHD symptoms did improve while on the medications, on the 20 mg dose she noticed by the end of the second prescription she was starting to feel it wear off.  She did have decreased appetite and a few times at work had remind her self to eat because she felt funny.  She did not have any difficulty getting to sleep goes to bed around 10:00 at night.  She did not have any palpitations, chest pain, weight loss, agitation. She has been dealing with some recent depression and anxiety, has seen a therapist and psychiatrist who have tried to tx her, wellbutrin did not help and she did not like SE. She doesn't know other med names that she tried.  She reports that psychiatry said she has depression.  She denies any hx of bipolar or family hx of the same.  Therapist- once a month usually, hasn't been in about 2 months Has seen psychiatrist  Not wanting to do anything She feels overwhelmed all the time, by simple things like a sink full of dishes Has no drive or ambition She didn't go to work last week and while at home she just felt "worthless" She can't take some of the meds from therapist/psych   Nicole Prestoonna  [name]- [facility]    Weight has not changed in the past several months Wt Readings from Last 5 Encounters:  10/29/18 160 lb 2 oz (72.6 kg)  08/30/18 162 lb 12.8 oz (73.8 kg)  07/23/18 165 lb (74.8 kg)  11/15/17 163 lb (73.9 kg)  10/21/17 162 lb 6.4 oz (73.7 kg)      Patient Active Problem List   Diagnosis Date Noted  . Encounter for surveillance of contraceptive pills 08/30/2018  . Breast mass, right 08/30/2018  . Depression   . Encounter for management of intrauterine contraceptive device (IUD) 08/20/2016  . IUD (intrauterine device) in place 08/08/2016  . Irregular bleeding 08/08/2016  . Encounter for insertion of mirena IUD 07/11/2016  . History of prior pregnancy with IUGR newborn 06/09/2016  . 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 BV (bacterial vaginosis) 03/18/2013  . ADD (attention deficit disorder) 12/22/2012  . Vertigo 12/22/2012    Current Meds  Medication Sig  . norgestimate-ethinyl estradiol (MONO-LINYAH) 0.25-35 MG-MCG tablet TAKE ONE (1) TABLET EACH DAY     Review of Systems  Constitutional: Negative.   HENT: Negative.   Eyes: Negative.   Respiratory: Negative.   Cardiovascular: Negative.   Gastrointestinal: Negative.   Endocrine: Negative.   Genitourinary: Negative.   Musculoskeletal: Negative.   Skin: Negative.  Allergic/Immunologic: Negative.   Neurological: Negative.   Hematological: Negative.   Psychiatric/Behavioral: Negative.   All other systems reviewed and are negative.      Objective:    Vitals:   10/29/18 1434  BP: 110/74  Pulse: 74  Temp: 98.7 F (37.1 C)  TempSrc: Oral  SpO2: 99%  Weight: 160 lb 2 oz (72.6 kg)  Height: 5\' 8"  (1.727 m)      Physical Exam Vitals signs and nursing note reviewed.  Constitutional:      General: She is not in acute distress.    Appearance: She is well-developed and normal weight. She is not ill-appearing, toxic-appearing or diaphoretic.  HENT:     Head: Normocephalic  and atraumatic.     Right Ear: External ear normal.     Left Ear: External ear normal.     Nose: Nose normal.     Mouth/Throat:     Mouth: Mucous membranes are moist.     Pharynx: Oropharynx is clear. No oropharyngeal exudate.  Eyes:     General: No scleral icterus.       Right eye: No discharge.        Left eye: No discharge.     Conjunctiva/sclera: Conjunctivae normal.     Pupils: Pupils are equal, round, and reactive to light.  Neck:     Musculoskeletal: Normal range of motion and neck supple.     Trachea: No tracheal deviation.  Cardiovascular:     Rate and Rhythm: Normal rate and regular rhythm.     Pulses: Normal pulses.     Heart sounds: Normal heart sounds. No murmur. No friction rub. No gallop.   Pulmonary:     Effort: Pulmonary effort is normal. No respiratory distress.     Breath sounds: Normal breath sounds. No stridor. No wheezing or rales.  Chest:     Chest wall: No tenderness.  Abdominal:     General: Bowel sounds are normal. There is no distension.     Palpations: Abdomen is soft.     Tenderness: There is no abdominal tenderness. There is no guarding or rebound.  Musculoskeletal: Normal range of motion.  Lymphadenopathy:     Cervical: No cervical adenopathy.  Skin:    General: Skin is warm and dry.     Capillary Refill: Capillary refill takes less than 2 seconds.     Coloration: Skin is not pale.     Findings: No rash.  Neurological:     Mental Status: She is alert and oriented to person, place, and time.     Motor: No weakness or abnormal muscle tone.     Coordination: Coordination normal.     Gait: Gait normal.  Psychiatric:        Mood and Affect: Mood normal.        Speech: Speech is rapid and pressured.        Behavior: Behavior normal. Behavior is cooperative.        Thought Content: Thought content does not include homicidal or suicidal ideation. Thought content does not include homicidal or suicidal plan.           Assessment & Plan:      ICD-10-CM   1. Attention deficit hyperactivity disorder (ADHD), combined type F90.2 amphetamine-dextroamphetamine (ADDERALL XR) 20 MG 24 hr capsule   here for med refill, 20 mg XR adderall did help her function at work, she denies sleeping issues or SE  2. Major depressive disorder with current active episode, unspecified depression  episode severity, unspecified whether recurrent F32.9    Requesting records from therapist - Pt do decide if she would like some med management here or with past psychiatrist   Pt denies SI, HI, AVH, she was encouraged to set up therapy sessions and discuss other possible medications.  Do feel she needs some medical management with talk therapy for her moods/depression/anxiety.   Follow up in one month     Danelle Berry, PA-C 10/29/18 2:45 PM

## 2018-11-04 ENCOUNTER — Telehealth: Payer: Self-pay | Admitting: Family Medicine

## 2018-11-04 NOTE — Telephone Encounter (Signed)
Pt called states she has decided to have her medications prescribed and managed here. Pt states she does not have a psychiatrist / therapist any longer. Pt states she signed release for previous phych records to be sent to Danelle Berry PA-C for review. Pt states she will fu at appt 12/03/18.

## 2018-12-03 ENCOUNTER — Ambulatory Visit: Payer: BLUE CROSS/BLUE SHIELD | Admitting: Family Medicine

## 2018-12-07 ENCOUNTER — Ambulatory Visit: Payer: BLUE CROSS/BLUE SHIELD | Admitting: Family Medicine

## 2018-12-07 ENCOUNTER — Encounter: Payer: Self-pay | Admitting: Family Medicine

## 2018-12-07 ENCOUNTER — Other Ambulatory Visit: Payer: Self-pay

## 2018-12-07 DIAGNOSIS — F902 Attention-deficit hyperactivity disorder, combined type: Secondary | ICD-10-CM | POA: Diagnosis not present

## 2018-12-07 MED ORDER — AMPHETAMINE-DEXTROAMPHETAMINE 20 MG PO TABS: 20.0000 mg | ORAL_TABLET | Freq: Every day | ORAL | 0 refills | Status: DC

## 2018-12-07 MED ORDER — AMPHETAMINE-DEXTROAMPHET ER 20 MG PO CP24: 20.0000 mg | ORAL_CAPSULE | ORAL | 0 refills | Status: DC

## 2018-12-07 NOTE — Patient Instructions (Addendum)
Take with food  Can try the immediate release to see if your side effects are less  Call me in a month to let me know how the meds are working

## 2018-12-07 NOTE — Progress Notes (Signed)
Patient ID: Nicole [name], female    DOB: May 23, 1996, 23 y.o.   MRN: [id number]  PCP: Danelle Berry, PA-C  Chief Complaint  Patient presents with  . Medication Screening    Subjective:   Nicole [name] is a 23 y.o. female, presents to clinic with CC of ADHD f/up and med check. Overall she reports ADHD sx are better with adderral XR 20 mg, she is getting to work, performing better, not late.  She is not forgetting things at her job or making mistakes.  She is having some SE of the meds, she had same in the past and as a teenager when she took adderral and vyvanse.  She does not eat breakfast, taking meds first things upon waking causes nausea and decreased appetite.  Trying to eat something does help a little bit, but she rarely eats in the morning.  Today she has taken meds and drank coffee, and she gets a little jittery for a bit, then it improves after an hour or so.  She has more pronounced SE when she just starts meds again, or has been out for a week or so.  After she takes medicines again for 1-2 weeks she gets used to it and SE minimize.  She also reports on days when taking meds, she gets "wound up," clinches her teeth to the point of making her jaw sore, and she has trouble going to sleep.   She does not want to switch to vyvanse (says its the same) and she doesn't want IR meds because she "will forget to take" a second dose if needed.   On the first day of taking meds she takes in early morning between 6:30-8 am, she has trouble going to sleep until after midnight, but she says if she gets 4-6 hours of sleep "she's good."   Still trying not to take meds on days off and weekends. No weight loss.   No CP, palpitations    Patient Active Problem List   Diagnosis Date Noted  . Encounter for surveillance of contraceptive pills 08/30/2018  . Breast mass, right 08/30/2018  . Depression   . Encounter for management of intrauterine contraceptive device (IUD) 08/20/2016  . IUD  (intrauterine device) in place 08/08/2016  . Irregular bleeding 08/08/2016  . Encounter for insertion of mirena IUD 07/11/2016  . History of prior pregnancy with IUGR newborn 06/09/2016  . 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 BV (bacterial vaginosis) 03/18/2013  . ADD (attention deficit disorder) 12/22/2012  . Vertigo 12/22/2012    Current Meds  Medication Sig  . amphetamine-dextroamphetamine (ADDERALL XR) 20 MG 24 hr capsule Take 1 capsule (20 mg total) by mouth every morning.  Marland Kitchen ibuprofen (ADVIL,MOTRIN) 600 MG tablet Take 1 tablet (600 mg total) by mouth every 6 (six) hours.  . norgestimate-ethinyl estradiol (MONO-LINYAH) 0.25-35 MG-MCG tablet TAKE ONE (1) TABLET EACH DAY     Review of Systems  Constitutional: Negative.   HENT: Negative.   Eyes: Negative.   Respiratory: Negative.   Cardiovascular: Negative.   Gastrointestinal: Negative.   Endocrine: Negative.   Genitourinary: Negative.   Musculoskeletal: Negative.   Skin: Negative.   Allergic/Immunologic: Negative.   Neurological: Negative.   Hematological: Negative.   Psychiatric/Behavioral: Negative.   All other systems reviewed and are negative.      Objective:    Vitals:   12/07/18 1058  BP: 102/60  Pulse: 90  Resp: 18  Temp: 98.2 F (36.8 C)  SpO2: 98%  Weight: 162 lb 9.6 oz (73.8 kg)  Height: 5\' 7"  (1.702 m)      Wt Readings from Last 5 Encounters:  12/07/18 162 lb 9.6 oz (73.8 kg)  10/29/18 160 lb 2 oz (72.6 kg)  08/30/18 162 lb 12.8 oz (73.8 kg)  07/23/18 165 lb (74.8 kg)  11/15/17 163 lb (73.9 kg)     Physical Exam Vitals signs and nursing note reviewed.  Constitutional:      General: She is not in acute distress.    Appearance: Normal appearance. She is well-developed and normal weight. She is not ill-appearing, toxic-appearing or diaphoretic.  HENT:     Head: Normocephalic and atraumatic.     Nose: Nose normal.  Eyes:     General:        Right eye: No discharge.         Left eye: No discharge.     Conjunctiva/sclera: Conjunctivae normal.  Neck:     Trachea: No tracheal deviation.  Cardiovascular:     Rate and Rhythm: Normal rate and regular rhythm.  Pulmonary:     Effort: Pulmonary effort is normal. No respiratory distress.     Breath sounds: No stridor.  Musculoskeletal: Normal range of motion.  Skin:    General: Skin is warm and dry.     Findings: No rash.  Neurological:     Mental Status: She is alert.     Motor: No weakness or abnormal muscle tone.     Coordination: Coordination normal.     Gait: Gait normal.  Psychiatric:        Attention and Perception: Attention normal.        Mood and Affect: Mood and affect normal.        Speech: Speech normal.        Behavior: Behavior is hyperactive. Behavior is not agitated or aggressive.        Thought Content: Thought content normal.           Assessment & Plan:   Here for med recheck of Adderral XR for adult ADHD:   Problem List Items Addressed This Visit      Other   ADHD (attention deficit hyperactivity disorder), combined type    On adderral 20 mg XR Reports helping with sx and job performance SE mostly GI and some insomnia, pt does not want to change med or dose - says it improves after she's been on it for a while. Can continue XR dose for now, take with food, f/up if SE more bothersome than the benefit  Gave few IR pills to try in am on work day to see if it lasts through work, and limits trouble sleeping  Telephone F/up 1 month      Relevant Medications   amphetamine-dextroamphetamine (ADDERALL XR) 20 MG 24 hr capsule   amphetamine-dextroamphetamine (ADDERALL) 20 MG tablet      Additionally pt appears well, no weightloss, HR WNL    Danelle Berry, PA-C 12/07/18 11:40 AM

## 2018-12-07 NOTE — Assessment & Plan Note (Signed)
On adderral 20 mg XR Reports helping with sx and job performance SE mostly GI and some insomnia, pt does not want to change med or dose - says it improves after she's been on it for a while. Can continue XR dose for now, take with food, f/up if SE more bothersome than the benefit  Gave few IR pills to try in am on work day to see if it lasts through work, and limits trouble sleeping  Telephone F/up 1 month

## 2019-01-06 ENCOUNTER — Encounter: Payer: Self-pay | Admitting: Family Medicine

## 2019-01-06 ENCOUNTER — Other Ambulatory Visit: Payer: Self-pay

## 2019-01-06 ENCOUNTER — Ambulatory Visit (INDEPENDENT_AMBULATORY_CARE_PROVIDER_SITE_OTHER): Payer: BLUE CROSS/BLUE SHIELD | Admitting: Family Medicine

## 2019-01-06 DIAGNOSIS — F902 Attention-deficit hyperactivity disorder, combined type: Secondary | ICD-10-CM | POA: Diagnosis not present

## 2019-01-06 MED ORDER — AMPHETAMINE-DEXTROAMPHET ER 20 MG PO CP24: 20.0000 mg | ORAL_CAPSULE | ORAL | 0 refills | Status: DC

## 2019-01-06 MED ORDER — AMPHETAMINE-DEXTROAMPHETAMINE 20 MG PO TABS: 20.0000 mg | ORAL_TABLET | Freq: Every day | ORAL | 0 refills | Status: DC

## 2019-01-06 NOTE — Progress Notes (Signed)
Patient ID: Nicole [name], female    DOB: [birth year], 23 y.o.   MRN: [id number]  PCP: Danelle Berry, PA-C  Virtual Visit via telephone  Phone visit arranged with Dionicia Abler for 01/06/19 at  9:00 AM EDT  Services provided today were via telemedicine through telephone call. Start of phone call:  9:00 AM  I verified that I was speaking with the correct person using two identifiers. Patient reported their location during encounter was at home   Patient consented to telephone visit  I conducted telephone visit from [facility]  Referring Provider:   Danelle Berry, PA-C   All participants in encounter:  Myself and the patient   I discussed the limitations, risks, security and privacy concerns of performing an evaluation and management service by telephone and the availability of in person appointments. I also discussed with the patient that there may be a patient responsible charge related to this service. The patient expressed understanding and agreed to proceed.  Chief Complaint  Patient presents with  . ADHD     med check and dosing changes    Subjective:   Nicole [name] is a 23 y.o. female, presents to clinic with CC of ADHD med f/up with SE and effectiveness.  IR causes less SE but not effective for very long and she cannot remember to take a second dose when she "crashes" around lunchtime and afternoon.  She does like it better overall with less SE. XR still causes some stomach aches and HA, but she has tried to eat in the morning and SE are less often and less severe, gets HA by the end of the day but also doesn't eat all day.  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 Denies weightloss, CP, palpitations, near syncope, difficulty sleeping.     Patient Active Problem List   Diagnosis Date Noted  . Breast mass, right 08/30/2018  . Depression   . IUD (intrauterine device) in place 08/08/2016  . 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 ADHD (attention deficit hyperactivity disorder), combined type 12/22/2012    Prior to Admission medications   Medication Sig Start Date End Date Taking? Authorizing Provider  amphetamine-dextroamphetamine (ADDERALL XR) 20 MG 24 hr capsule Take 1 capsule (20 mg total) by mouth every morning. 12/07/18   Danelle Berry, PA-C  amphetamine-dextroamphetamine (ADDERALL) 20 MG tablet Take 1 tablet (20 mg total) by mouth daily. 12/07/18   Danelle Berry, PA-C  ibuprofen (ADVIL,MOTRIN) 600 MG tablet Take 1 tablet (600 mg total) by mouth every 6 (six) hours. 05/01/16   Berton Bon, MD  norgestimate-ethinyl estradiol [facility]) 0.25-35 MG-MCG tablet TAKE ONE (1) TABLET EACH DAY 08/30/18   Adline Potter, NP    No Known Allergies  Review of Systems  Constitutional: Negative.   HENT: Negative.   Eyes: Negative.   Respiratory: Negative.   Cardiovascular: Negative.   Gastrointestinal: Negative.   Endocrine: Negative.   Genitourinary: Negative.   Musculoskeletal: Negative.   Skin: Negative.   Allergic/Immunologic: Negative.   Neurological: Negative.   Hematological: Negative.   Psychiatric/Behavioral: Negative.   All other systems reviewed and are negative.      Objective:    There were no vitals filed for this visit.    Physical Exam  Limited due to telephone encounter Pt spoke in clear complete sentences, speech continues to be somewhat rapid, overall did sound hyperactive.      Assessment & Plan:  ICD-10-CM   1. Attention deficit hyperactivity disorder (ADHD), combined type F90.2 amphetamine-dextroamphetamine (ADDERALL XR) 20 MG 24 hr capsule    amphetamine-dextroamphetamine (ADDERALL) 20 MG tablet   here for med refill, 20 mg XR adderall did help her function at work, she denies sleeping issues or SE    Will continue to provide both meds for now for days at work and and home - pt to eat breakfast and something else midday.  Instructed to set  alarms or reminders to herself for meds, drinking and eating. Suspect most of the SE are from her not eating (which is chronic for her, and not only isolated to restarting ADHD meds).  Will need to touch base before next refill.  Also suggested trying Vyvanse if still having SE.    I discussed the assessment and treatment plan with the patient. The patient was provided an opportunity to ask questions and all were answered. The patient agreed with the plan and demonstrated an understanding of the instructions.   The patient was advised to call back or seek an in-person evaluation if the symptoms worsen or if the condition fails to improve as anticipated.  Phone call concluded at 9:10 am I provided 10 minutes of non-face-to-face time during this encounter.  Danelle BerryLeisa Romolo Sieling, PA-C 01/06/19 9:27 AM

## 2019-02-01 DIAGNOSIS — F909 Attention-deficit hyperactivity disorder, unspecified type: Secondary | ICD-10-CM | POA: Diagnosis not present

## 2019-03-03 DIAGNOSIS — F909 Attention-deficit hyperactivity disorder, unspecified type: Secondary | ICD-10-CM | POA: Diagnosis not present

## 2019-03-08 ENCOUNTER — Other Ambulatory Visit: Payer: Self-pay | Admitting: Family Medicine

## 2019-03-08 DIAGNOSIS — F902 Attention-deficit hyperactivity disorder, combined type: Secondary | ICD-10-CM

## 2019-03-08 MED ORDER — AMPHETAMINE-DEXTROAMPHET ER 20 MG PO CP24: 20.0000 mg | ORAL_CAPSULE | ORAL | 0 refills | Status: DC

## 2019-03-08 NOTE — Telephone Encounter (Signed)
Requested Prescriptions   Pending Prescriptions Disp Refills  . amphetamine-dextroamphetamine (ADDERALL XR) 20 MG 24 hr capsule 30 capsule 0    Sig: Take 1 capsule (20 mg total) by mouth every morning.     Last OV 01/06/2019  Last written 01/06/2019

## 2019-03-08 NOTE — Telephone Encounter (Signed)
Pt needs refill on adderall to [facility]

## 2019-03-31 DIAGNOSIS — F909 Attention-deficit hyperactivity disorder, unspecified type: Secondary | ICD-10-CM | POA: Diagnosis not present

## 2019-04-19 ENCOUNTER — Other Ambulatory Visit: Payer: Self-pay | Admitting: Family Medicine

## 2019-04-19 DIAGNOSIS — F902 Attention-deficit hyperactivity disorder, combined type: Secondary | ICD-10-CM

## 2019-04-20 NOTE — Telephone Encounter (Signed)
Pt requesting refill on Adderall      LOV: 01/06/19 Nicole [name]  LRF:   03/08/19

## 2019-05-06 DIAGNOSIS — F909 Attention-deficit hyperactivity disorder, unspecified type: Secondary | ICD-10-CM | POA: Diagnosis not present

## 2019-06-01 ENCOUNTER — Ambulatory Visit (INDEPENDENT_AMBULATORY_CARE_PROVIDER_SITE_OTHER): Payer: BC Managed Care – PPO | Admitting: Family Medicine

## 2019-06-01 ENCOUNTER — Encounter: Payer: Self-pay | Admitting: Family Medicine

## 2019-06-01 ENCOUNTER — Other Ambulatory Visit: Payer: Self-pay

## 2019-06-01 VITALS — BP 110/62 | HR 92 | Temp 98.4°F | Resp 14 | Ht 67.0 in | Wt 158.0 lb

## 2019-06-01 DIAGNOSIS — R112 Nausea with vomiting, unspecified: Secondary | ICD-10-CM | POA: Diagnosis not present

## 2019-06-01 DIAGNOSIS — R1013 Epigastric pain: Secondary | ICD-10-CM | POA: Diagnosis not present

## 2019-06-01 MED ORDER — ONDANSETRON 4 MG PO TBDP: 4.0000 mg | ORAL_TABLET | Freq: Three times a day (TID) | ORAL | 1 refills | Status: DC | PRN

## 2019-06-01 NOTE — Patient Instructions (Signed)
We will call with results Ultrasound to be done  Use zofran for nausea  F/U pending results

## 2019-06-01 NOTE — Progress Notes (Signed)
Subjective:    Patient ID: Nicole [name], female    DOB: [birth year], 23 y.o.   MRN: [id number]  Patient presents for Nausea (x6 weeks- increased nausea and bloating after eating- sometimes has to force herself to vomit to relieve nausea )   Pt here with vomiting episodes and severe nausea for past 6 weeks.   No recent illness. Has severe nausea that hits at lunch, often will vomit on her own but if she doesn't, feels so bloated and nausea that she will make her self vomit.  She cannot eat in the morning time typically eats around a lay up in a.m. if she is anything heavy she would get the severe nausea may vomit.  She does try to eat later in the afternoon if she does vomit near lunchtime has to be very light in order to settle on her stomach but she has GI discomfort epigastric pain associated with the nausea anytime she eats.  She does occasionally get heartburn if it is a heavy meal or if she has things like pizza which she did have last night she had to use Tums.  She feels like her weight has been decreasing as well she is down 5 pounds since March but states that she weighed herself at home was down to 155 pounds a couple of weeks ago.  has softer stools not always formed.  She is on adderall been on this medication for about a year for her ADHD she admits when she first started she would have severe nausea did not want to eat but that subsided.,    takes TUMS OTC if she has heartburn       Review Of Systems:  GEN- denies fatigue, fever, weight loss,weakness, recent illness HEENT- denies eye drainage, change in vision, nasal discharge, CVS- denies chest pain, palpitations RESP- denies SOB, cough, wheeze ABD- denies N/V, change in stools, abd pain GU- denies dysuria, hematuria, dribbling, incontinence MSK- denies joint pain, muscle aches, injury Neuro- denies headache, dizziness, syncope, seizure activity       Objective:    BP 110/62   Pulse 92   Temp 98.4 F (36.9 C)  (Oral)   Resp 14   Ht 5\' 7"  (1.702 m)   Wt 158 lb (71.7 kg)   SpO2 98%   BMI 24.75 kg/m  GEN- NAD, alert and oriented x3 HEENT- PERRL, EOMI, non injected sclera, pink conjunctiva, MMM, oropharynx clear Neck- Supple, no thyromegaly CVS- RRR, no murmur RESP-CTAB ABD-NABS,soft, tender to palpation epigastric region negative Murphy sign no rebound no guarding  Psych normal affect and mood very pleasant EXT- No edema Pulses- Radial, DP- 2+        Assessment & Plan:      Problem List Items Addressed This Visit    None    Visit Diagnoses    Epigastric pain    -  Primary   Epigastric pain with nausea and vomiting.  Differential diagnosis include H. pylori, ulcer, gastritis, gallbladder etiology pancreatitis We will start with urea breath test and labs.  We will also obtain a right upper quadrant ultrasound.  Based on her description eating is making her have the nausea and need to vomit does not seem like she is purposely self inducing vomiting which relates an eating disorder such as bulimia. We will see what her work-up looks like.  She may need GI involved In meantime I have given her zofran ODT     Relevant Orders   Comprehensive metabolic  panel   CBC with Differential/Platelet   Lipase   Amylase   H. pylori breath test   US Abdomen Limited RUQ   Nausea and vomiting, intractability of vomiting not specified, unspecified vomiting type       Relevant Orders   Comprehensive metabolic panel   CBC with Differential/Platelet   Lipase   Amylase   H. pylori breath test   US Abdomen Limited RUQ      Note: This dictation was prepared with Dragon dictation along with smaller phrase technology. Any transcriptional errors that result from this process are unintentional.

## 2019-06-02 LAB — CBC WITH DIFFERENTIAL/PLATELET
Absolute Monocytes: 585 cells/uL (ref 200–950)
Basophils Absolute: 30 cells/uL (ref 0–200)
Basophils Relative: 0.4 %
Eosinophils Absolute: 60 cells/uL (ref 15–500)
Eosinophils Relative: 0.8 %
HCT: 38 % (ref 35.0–45.0)
Hemoglobin: 12.8 g/dL (ref 11.7–15.5)
Lymphs Abs: 2535 cells/uL (ref 850–3900)
MCH: 30.7 pg (ref 27.0–33.0)
MCHC: 33.7 g/dL (ref 32.0–36.0)
MCV: 91.1 fL (ref 80.0–100.0)
MPV: 12.6 fL — ABNORMAL HIGH (ref 7.5–12.5)
Monocytes Relative: 7.8 %
Neutro Abs: 4290 cells/uL (ref 1500–7800)
Neutrophils Relative %: 57.2 %
Platelets: 200 10*3/uL (ref 140–400)
RBC: 4.17 10*6/uL (ref 3.80–5.10)
RDW: 12.2 % (ref 11.0–15.0)
Total Lymphocyte: 33.8 %
WBC: 7.5 10*3/uL (ref 3.8–10.8)

## 2019-06-02 LAB — COMPREHENSIVE METABOLIC PANEL
AG Ratio: 2 (calc) (ref 1.0–2.5)
ALT: 11 U/L (ref 6–29)
AST: 13 U/L (ref 10–30)
Albumin: 4 g/dL (ref 3.6–5.1)
Alkaline phosphatase (APISO): 44 U/L (ref 31–125)
BUN: 11 mg/dL (ref 7–25)
CO2: 25 mmol/L (ref 20–32)
Calcium: 9 mg/dL (ref 8.6–10.2)
Chloride: 104 mmol/L (ref 98–110)
Creat: 0.86 mg/dL (ref 0.50–1.10)
Globulin: 2 g/dL (calc) (ref 1.9–3.7)
Glucose, Bld: 83 mg/dL (ref 65–99)
Potassium: 4.8 mmol/L (ref 3.5–5.3)
Sodium: 139 mmol/L (ref 135–146)
Total Bilirubin: 0.2 mg/dL (ref 0.2–1.2)
Total Protein: 6 g/dL — ABNORMAL LOW (ref 6.1–8.1)

## 2019-06-02 LAB — H. PYLORI BREATH TEST: H. pylori Breath Test: NOT DETECTED

## 2019-06-02 LAB — LIPASE: Lipase: 31 U/L (ref 7–60)

## 2019-06-02 LAB — AMYLASE: Amylase: 31 U/L (ref 21–101)

## 2019-06-08 ENCOUNTER — Ambulatory Visit (HOSPITAL_COMMUNITY)
Admission: RE | Admit: 2019-06-08 | Discharge: 2019-06-08 | Disposition: A | Discharge status: Home / Self Care | Payer: BC Managed Care – PPO | Source: Ambulatory Visit | Attending: Family Medicine | Admitting: Family Medicine

## 2019-06-08 ENCOUNTER — Other Ambulatory Visit: Payer: Self-pay

## 2019-06-08 DIAGNOSIS — R1013 Epigastric pain: Secondary | ICD-10-CM | POA: Insufficient documentation

## 2019-06-08 DIAGNOSIS — R112 Nausea with vomiting, unspecified: Secondary | ICD-10-CM

## 2019-06-08 DIAGNOSIS — K8020 Calculus of gallbladder without cholecystitis without obstruction: Secondary | ICD-10-CM | POA: Diagnosis not present

## 2019-06-09 ENCOUNTER — Encounter: Payer: Self-pay | Admitting: Gastroenterology

## 2019-06-09 ENCOUNTER — Other Ambulatory Visit: Payer: Self-pay | Admitting: *Deleted

## 2019-06-09 DIAGNOSIS — R634 Abnormal weight loss: Secondary | ICD-10-CM

## 2019-06-09 DIAGNOSIS — R112 Nausea with vomiting, unspecified: Secondary | ICD-10-CM

## 2019-06-09 DIAGNOSIS — R1013 Epigastric pain: Secondary | ICD-10-CM

## 2019-06-13 ENCOUNTER — Other Ambulatory Visit: Payer: Self-pay | Admitting: Family Medicine

## 2019-06-13 DIAGNOSIS — F902 Attention-deficit hyperactivity disorder, combined type: Secondary | ICD-10-CM

## 2019-06-13 NOTE — Telephone Encounter (Signed)
Ok to refill??  Last office visit 06/01/2019.  Last refill 04/20/2019.

## 2019-06-28 ENCOUNTER — Ambulatory Visit: Payer: BC Managed Care – PPO | Admitting: Gastroenterology

## 2019-06-28 ENCOUNTER — Encounter: Payer: Self-pay | Admitting: Gastroenterology

## 2019-06-28 VITALS — BP 110/76 | HR 68 | Temp 98.1°F | Ht 67.0 in | Wt 156.0 lb

## 2019-06-28 DIAGNOSIS — R112 Nausea with vomiting, unspecified: Secondary | ICD-10-CM | POA: Diagnosis not present

## 2019-06-28 DIAGNOSIS — R140 Abdominal distension (gaseous): Secondary | ICD-10-CM

## 2019-06-28 DIAGNOSIS — Z1159 Encounter for screening for other viral diseases: Secondary | ICD-10-CM

## 2019-06-28 DIAGNOSIS — R194 Change in bowel habit: Secondary | ICD-10-CM | POA: Diagnosis not present

## 2019-06-28 MED ORDER — OMEPRAZOLE 40 MG PO CPDR: 40.0000 mg | DELAYED_RELEASE_CAPSULE | Freq: Every day | ORAL | 3 refills | Status: DC

## 2019-06-28 MED ORDER — CITRUCEL PO POWD: 1.0000 | Freq: Every day | ORAL | Status: DC

## 2019-06-28 NOTE — Patient Instructions (Addendum)
If you are age 25 or older, your body mass index should be between 23-30. Your Body mass index is 24.43 kg/m. If this is out of the aforementioned range listed, please consider follow up with your Primary Care Provider.  If you are age 28 or younger, your body mass index should be between 19-25. Your Body mass index is 24.43 kg/m. If this is out of the aformentioned range listed, please consider follow up with your Primary Care Provider.   To help prevent the possible spread of infection to our patients, communities, and staff; we will be implementing the following measures:  As of now we are not allowing any visitors/family members to accompany you to any upcoming appointments with [facility]. If you have any concerns about this please contact our office to discuss prior to the appointment.   You have been scheduled for an endoscopy. Please follow written instructions given to you at your visit today. If you use inhalers (even only as needed), please bring them with you on the day of your procedure. Your physician has requested that you go to www.startemmi.com and enter the access code given to you at your visit today. This web site gives a general overview about your procedure. However, you should still follow specific instructions given to you by our office regarding your preparation for the procedure.  We have sent the following medications to your pharmacy for you to pick up at your convenience: Omeprazole 40mg : Take once a day  Please purchase the following medications over the counter and take as directed: Citrucel: Take once daily   We are giving you a Low FOD MAP diet to follow.  Thank you for entrusting me with your care and for choosing [facility], Dr. [place]

## 2019-06-28 NOTE — Progress Notes (Signed)
HPI :  23 year old female with a history of ADD, regular menstrual bleeding, nausea and vomiting, referred by Vic Blackbird MD for nausea, vomiting, weight loss, bowel changes.  She states for the past few months she has developed multiple symptoms that been bothering her.  The main issue has been postprandial vomiting.  She states after she eats certain foods, often heavy, particularly at lunch, she will end up needing to vomit as she feels so poorly after eating.  If she does not eat she will not vomit but does get nauseated periodically.  Over the last few months her weight has gone from 163 pounds to 156 pounds.  She denies any focal abdominal pains but does feel significant gas and bloating that will bother her, sometimes she states the bloating is so bad she is miserable.  She denies any routine NSAID use.  She does have some reflux and pyrosis that bothers her from time to time.  She has been taking some Tums which does provide some relief for some of her symptoms.  No dysphagia.  She has been monitoring her diet and trying to avoid heavy foods, eating more bland foods and this has helped reduce some of her symptoms but has not resolved it.  She previously took a PPI in the past for reflux but has been off it for a long time.  She does have some mid back pain at times which can radiate around into her upper abdomen.  She has had some altered bowel habits recently, sometimes has constipated stools, sometimes has loose stools.  She generally does not have any blood in the stool, had one scant episode a few months ago that she thought was due to hemorrhoids and has not had recurrence.  Bowel movement can help with some of the bloating.  She does not drink any alcohol, she does smoke tobacco at half a pack a day.  She denies any marijuana use.   She has not tried anything for her symptoms yet.  Thus far she has had a work-up to include normal CBC, normal c-Met, normal amylase and lipase.  She had an H.  pylori breath test that was negative.  She underwent a right upper quadrant ultrasound on 06/08/19 which was negative, no gallstones.  She was offered some Zofran for the nausea and vomiting which she has not tried yet.  No prior upper endoscopy or colonoscopy.  She denies any family history of colon cancer, gastric cancer, IBD, celiac disease, etc.  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  She endorses a lot of stress in her life.  She has been on Adderall for ADD over the past year  Past Medical History:  Diagnosis Date  . ADD (attention deficit disorder)   . BV (bacterial vaginosis) 03/18/2013   Treat ed with flagyl  . Contraceptive management 05/29/2015  . Encounter for insertion of mirena IUD 07/11/2016   07/11/16   . Encounter for management of intrauterine contraceptive device (IUD) 08/20/2016  . Encounter for surveillance of contraceptive pills 08/30/2018  . History of prior pregnancy with IUGR newborn 06/09/2016  . Irregular bleeding 08/08/2016  . Supervision of normal pregnancy in first trimester 10/05/2015    [facility]  Initiated Care at   9+6 weeks  FOB  Marvia Pickles 23 yo WM  Dating By  LMP and [place]  Pap  10/05/15 negative +yeast  GC/CT Initial:     -/-  36+wks:  Genetic Screen AFP  CF screen   1 variant detected ; FOB declines testing  Anatomic [place]   Flu vaccine  10/05/15  Tdap Recommended ~ 28wks  Glucose Screen  2 hr  GBS   Feed Preference   Contraception   Circumcision   Childbirth Classes   Pediatrician      . Vertigo      No past surgical history on file. Family History  Problem Relation Age of Onset  . Glaucoma Maternal Grandmother   . Hypertension Maternal Grandmother   . Anxiety disorder Maternal Grandmother   . Deafness Mother   . Arthritis Mother   . Hyperlipidemia Mother   . Dementia Paternal Grandmother   . COPD Maternal Grandfather   . Heart attack Father   . Deafness Father    Social History   Tobacco Use  . Smoking  status: Never Smoker  . Smokeless tobacco: Never Used  Substance Use Topics  . Alcohol use: No    Alcohol/week: 0.0 standard drinks    Comment: every weekend; not now  . Drug use: No   Current Outpatient Medications  Medication Sig Dispense Refill  . ADDERALL XR 20 MG 24 hr capsule TAKE ONE CAPSULE ('20MG'$  TOTAL) BY MOUTH EVERY MORNING 30 capsule 0  . ibuprofen (ADVIL,MOTRIN) 600 MG tablet Take 1 tablet (600 mg total) by mouth every 6 (six) hours. 60 tablet 0  . norgestimate-ethinyl estradiol (MONO-LINYAH) 0.25-35 MG-MCG tablet TAKE ONE (1) TABLET EACH DAY 28 tablet 12   No current facility-administered medications for this visit.    No Known Allergies   Review of Systems: All systems reviewed and negative except where noted in HPI.    US Abdomen Limited Ruq  Result Date: 06/08/2019 CLINICAL DATA:  Gallstones. EXAM: ULTRASOUND ABDOMEN LIMITED RIGHT UPPER QUADRANT COMPARISON:  None. FINDINGS: Gallbladder: No gallstones or wall thickening visualized. No sonographic Murphy sign noted by sonographer. Common bile duct: Diameter: 2.2 mm Liver: No focal lesion identified. Within normal limits in parenchymal echogenicity. Portal vein is patent on color Doppler imaging with normal direction of blood flow towards the liver. Other: None. IMPRESSION: Normal study. Electronically Signed   By: Constance Holster M.D.   On: 06/08/2019 08:56   Lab Results  Component Value Date   WBC 7.5 06/01/2019   HGB 12.8 06/01/2019   HCT 38.0 06/01/2019   MCV 91.1 06/01/2019   PLT 200 06/01/2019    Lab Results  Component Value Date   CREATININE 0.86 06/01/2019   BUN 11 06/01/2019   NA 139 06/01/2019   K 4.8 06/01/2019   CL 104 06/01/2019   CO2 25 06/01/2019    Lab Results  Component Value Date   ALT 11 06/01/2019   AST 13 06/01/2019   ALKPHOS 54 11/16/2017   BILITOT 0.2 06/01/2019     Physical Exam: BP 110/76   Pulse 68   Temp 98.1 F (36.7 C)   Ht '5\' 7"'$  (1.702 m)   Wt 156 lb (70.8 kg)    LMP 05/29/2019 (Approximate)   BMI 24.43 kg/m  Constitutional: Pleasant,well-developed, female in no acute distress. HEENT: Normocephalic and atraumatic. Conjunctivae are normal. No scleral icterus. Neck supple.  Cardiovascular: Normal rate, regular rhythm.  Pulmonary/chest: Effort normal and breath sounds normal. No wheezing, rales or rhonchi. Abdominal: Soft, nondistended, nontender. There are no masses palpable. No hepatomegaly. Extremities: no edema Lymphadenopathy: No cervical adenopathy noted. Neurological: Alert and oriented to person place and time. Skin: Skin is warm and dry.  No rashes noted. Psychiatric: Normal mood and affect. Behavior is normal.   ASSESSMENT AND PLAN: 23 year old female here for new patient evaluation regarding constellation of the following symptoms:  Postprandial vomiting / nausea / bloating - ongoing for a few months, thus far lab work-up negative, H. pylori breath test negative, right upper quadrant ultrasound looks okay.  Discussed differential diagnosis with her.  I think biliary colic is unlikely, she is not having any focal pain.  Given her vomiting and weight loss, recommend an EGD to further evaluate.  I discussed risk and benefits of upper endoscopy and anesthesia and she wanted to proceed.  We will make sure she does not have PUD, gastritis, rule out celiac disease, rule out gastric outlet obstruction.  In the interim recommend starting omeprazole 40 mg once a day as a trial to see if that will help.  I offer her some Zofran which she declined.  Given her significant bloating and bowel symptoms as below, also gave her some information about a low FODMAP diet to see if that will help.  If her symptoms persist despite regimen as outlined, and EGD is unremarkable, may consider cross-sectional imaging with CT.  She agreed with the plan.  Change in bowel habits - alternating constipation and loose stools with bloating. Labs reassuring. Will screen for celiac  disease with EGD.  Trial of low FODMAP diet as outlined.  Recommend Citrucel once a day to help provide some regularity to her bowels. Don't feel she needs colonoscopy right now but can consider if symptoms persist, she wishes to avoid colonoscopy if possible.  Will await her course on this regimen as above with further recommendations.   [place] Cellar, MD [facility]  CC: Alycia Rossetti, MD

## 2019-07-11 ENCOUNTER — Other Ambulatory Visit: Payer: Self-pay | Admitting: Family Medicine

## 2019-07-11 DIAGNOSIS — F902 Attention-deficit hyperactivity disorder, combined type: Secondary | ICD-10-CM

## 2019-07-11 NOTE — Telephone Encounter (Signed)
Requested Prescriptions   Pending Prescriptions Disp Refills  . ADDERALL XR 20 MG 24 hr capsule [Pharmacy Med Name: ADDERALL XR 20 MG CAP] 30 capsule     Sig: TAKE ONE CAPSULE (20MG  TOTAL) BY MOUTH EVERY MORNING    Last OV 01/06/2019  Last written 06/14/2019

## 2019-07-14 ENCOUNTER — Encounter: Payer: BC Managed Care – PPO | Admitting: Gastroenterology

## 2019-07-25 ENCOUNTER — Other Ambulatory Visit: Payer: Self-pay

## 2019-07-26 ENCOUNTER — Emergency Department (HOSPITAL_COMMUNITY)
Admission: EM | Admit: 2019-07-26 | Discharge: 2019-07-26 | Disposition: A | Discharge status: Home / Self Care | Payer: BC Managed Care – PPO | Attending: Emergency Medicine | Admitting: Emergency Medicine

## 2019-07-26 ENCOUNTER — Encounter (HOSPITAL_COMMUNITY): Payer: Self-pay

## 2019-07-26 ENCOUNTER — Other Ambulatory Visit: Payer: Self-pay

## 2019-07-26 ENCOUNTER — Telehealth: Payer: Self-pay | Admitting: *Deleted

## 2019-07-26 ENCOUNTER — Ambulatory Visit: Payer: Self-pay | Admitting: Family Medicine

## 2019-07-26 DIAGNOSIS — R195 Other fecal abnormalities: Secondary | ICD-10-CM | POA: Diagnosis not present

## 2019-07-26 DIAGNOSIS — Z975 Presence of (intrauterine) contraceptive device: Secondary | ICD-10-CM | POA: Diagnosis not present

## 2019-07-26 DIAGNOSIS — R112 Nausea with vomiting, unspecified: Secondary | ICD-10-CM

## 2019-07-26 DIAGNOSIS — R1013 Epigastric pain: Secondary | ICD-10-CM

## 2019-07-26 DIAGNOSIS — Z79899 Other long term (current) drug therapy: Secondary | ICD-10-CM | POA: Insufficient documentation

## 2019-07-26 DIAGNOSIS — N3001 Acute cystitis with hematuria: Secondary | ICD-10-CM | POA: Insufficient documentation

## 2019-07-26 DIAGNOSIS — F17210 Nicotine dependence, cigarettes, uncomplicated: Secondary | ICD-10-CM | POA: Insufficient documentation

## 2019-07-26 DIAGNOSIS — R300 Dysuria: Secondary | ICD-10-CM | POA: Diagnosis not present

## 2019-07-26 DIAGNOSIS — R319 Hematuria, unspecified: Secondary | ICD-10-CM | POA: Diagnosis not present

## 2019-07-26 LAB — WET PREP, GENITAL
Sperm: NONE SEEN
Trich, Wet Prep: NONE SEEN
Yeast Wet Prep HPF POC: NONE SEEN

## 2019-07-26 LAB — CBC WITH DIFFERENTIAL/PLATELET
Abs Immature Granulocytes: 0.07 10*3/uL (ref 0.00–0.07)
Basophils Absolute: 0 10*3/uL (ref 0.0–0.1)
Basophils Relative: 0 %
Eosinophils Absolute: 0.1 10*3/uL (ref 0.0–0.5)
Eosinophils Relative: 1 %
HCT: 39.3 % (ref 36.0–46.0)
Hemoglobin: 13.1 g/dL (ref 12.0–15.0)
Immature Granulocytes: 1 %
Lymphocytes Relative: 13 %
Lymphs Abs: 2 10*3/uL (ref 0.7–4.0)
MCH: 32.1 pg (ref 26.0–34.0)
MCHC: 33.3 g/dL (ref 30.0–36.0)
MCV: 96.3 fL (ref 80.0–100.0)
Monocytes Absolute: 0.8 10*3/uL (ref 0.1–1.0)
Monocytes Relative: 6 %
Neutro Abs: 11.6 10*3/uL — ABNORMAL HIGH (ref 1.7–7.7)
Neutrophils Relative %: 79 %
Platelets: 182 10*3/uL (ref 150–400)
RBC: 4.08 MIL/uL (ref 3.87–5.11)
RDW: 12.8 % (ref 11.5–15.5)
WBC: 14.5 10*3/uL — ABNORMAL HIGH (ref 4.0–10.5)
nRBC: 0 % (ref 0.0–0.2)

## 2019-07-26 LAB — URINALYSIS, ROUTINE W REFLEX MICROSCOPIC
Bacteria, UA: NONE SEEN
Bilirubin Urine: NEGATIVE
Glucose, UA: NEGATIVE mg/dL
Ketones, ur: NEGATIVE mg/dL
Nitrite: NEGATIVE
Protein, ur: 100 mg/dL — AB
RBC / HPF: >50 RBC/hpf — ABNORMAL HIGH (ref 0–5)
Specific Gravity, Urine: 1.018 (ref 1.005–1.030)
WBC, UA: >50 WBC/hpf — ABNORMAL HIGH (ref 0–5)
pH: 5 (ref 5.0–8.0)

## 2019-07-26 LAB — COMPREHENSIVE METABOLIC PANEL
ALT: 15 U/L (ref 0–44)
AST: 13 U/L — ABNORMAL LOW (ref 15–41)
Albumin: 3.7 g/dL (ref 3.5–5.0)
Alkaline Phosphatase: 43 U/L (ref 38–126)
Anion gap: 8 (ref 5–15)
BUN: 15 mg/dL (ref 6–20)
CO2: 22 mmol/L (ref 22–32)
Calcium: 8.7 mg/dL — ABNORMAL LOW (ref 8.9–10.3)
Chloride: 107 mmol/L (ref 98–111)
Creatinine, Ser: 0.92 mg/dL (ref 0.44–1.00)
GFR calc Af Amer: >60 mL/min (ref >60)
GFR calc non Af Amer: >60 mL/min (ref >60)
Glucose, Bld: 99 mg/dL (ref 70–99)
Potassium: 4.4 mmol/L (ref 3.5–5.1)
Sodium: 137 mmol/L (ref 135–145)
Total Bilirubin: 0.6 mg/dL (ref 0.3–1.2)
Total Protein: 6.4 g/dL — ABNORMAL LOW (ref 6.5–8.1)

## 2019-07-26 LAB — PREGNANCY, URINE: Preg Test, Ur: NEGATIVE

## 2019-07-26 LAB — POC OCCULT BLOOD, ED: Fecal Occult Bld: POSITIVE — AB

## 2019-07-26 MED ORDER — CEPHALEXIN 500 MG PO CAPS: 500.0000 mg | ORAL_CAPSULE | Freq: Three times a day (TID) | ORAL | 0 refills | Status: AC

## 2019-07-26 MED ORDER — CEPHALEXIN 500 MG PO CAPS
500.0000 mg | ORAL_CAPSULE | Freq: Once | ORAL | Status: AC
  Administered 2019-07-26: 500 mg via ORAL
  Filled 2019-07-26: qty 1

## 2019-07-26 MED ORDER — PHENAZOPYRIDINE HCL 95 MG PO TABS: 95.0000 mg | ORAL_TABLET | Freq: Three times a day (TID) | ORAL | 0 refills | Status: DC | PRN

## 2019-07-26 NOTE — Telephone Encounter (Signed)
Referral placed.

## 2019-07-26 NOTE — Telephone Encounter (Signed)
Call placed to patient and patient made aware.  Advised to contact GI to attempt earlier appointment.   Patient states that she would like to Dr. Tonia Brooms in [place] as this is closer to her home.

## 2019-07-26 NOTE — Telephone Encounter (Signed)
-----   Message from Alycia Rossetti, MD sent at 07/26/2019  2:08 PM EST ----- Regarding: Pt seen in ER    I recommend patient call her GI, they were planning to scope her EGD, possible colonoscopy, I think this would beneficial to her to figure out where blood and discomfort is coming from. I dont have any other test to give her.     Dr. Buelah Manis

## 2019-07-26 NOTE — ED Triage Notes (Signed)
Pt reports woke up this morning with hematuria, painful urination, and abd pain.  Also reports has had black stools for the past 4 or 5 days.  Reports has appt today with pcp this afternoon regarding dark stools.  Pt says has history of GI problems and goes to [facility].

## 2019-07-26 NOTE — ED Provider Notes (Signed)
[facility] EMERGENCY DEPARTMENT Provider Note   CSN: [id number] Arrival date & time: 07/26/19  0554     History   Chief Complaint Chief Complaint  Patient presents with   Dysuria    HPI Nicole [name] is a 23 y.o. female with history of irregular menstrual bleeding, EDD presents for evaluation of acute onset, persistent urinary symptoms since this morning.  She reports that yesterday afternoon she did feel a "twinge" of cramping pain in her lower abdomen and states "I thought a UTI was coming on".  This morning upon awakening around 4 AM she went to the bathroom and experienced some hematuria and dysuria.  Also notes some urinary urgency and lower abdominal aching.  Pain worsens with ambulation and urination.  She does note for the last 4 days she has experienced darker stools but not overtly bloody or tarry.  She is not taking Pepto-Bismol.  She has an appointment today to see her PCP regarding her dark stools.  She is also being followed by [facility] gastroenterology for postprandial nausea and vomiting, bloating, and change in bowel habits.  Reports that her nausea and vomiting that has been ongoing for several months has improved.  She denies any vaginal itching or discharge out of the ordinary, currently has multiple female sexual partners and does not always use protection. She declines HIV or syphilis testing today but is amenable to GC chlamydia and trichomonas testing.  She has never had any abdominal surgeries.     The history is provided by the patient.    Past Medical History:  Diagnosis Date   ADD (attention deficit disorder)    BV (bacterial vaginosis) 03/18/2013   Treat ed with flagyl   Contraceptive management 05/29/2015   Encounter for insertion of mirena IUD 07/11/2016   07/11/16    Encounter for management of intrauterine contraceptive device (IUD) 08/20/2016   Encounter for surveillance of contraceptive pills 08/30/2018   History of prior pregnancy with IUGR  newborn 06/09/2016   Irregular bleeding 08/08/2016   Supervision of normal pregnancy in first trimester 10/05/2015    [facility]  Initiated Care at   9+6 weeks  FOB  Marvia Pickles 23 yo WM  Dating By  LMP and [place]  Pap  10/05/15 negative +yeast  GC/CT Initial:     -/-           36+wks:  Genetic Screen AFP  CF screen   1 variant detected ; FOB declines testing  Anatomic [place]   Flu vaccine  10/05/15  Tdap Recommended ~ 28wks  Glucose Screen  2 hr  GBS   Feed Preference   Contraception   Circumcision   Childbirth Classes   Pediatrician       Vertigo     Patient Active Problem List   Diagnosis Date Noted   Breast mass, right 08/30/2018   Depression    IUD (intrauterine device) in place 08/08/2016   Cystic fibrosis carrier, 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 No pertinent surgical history.   OB History    Gravida  1   Para  1   Term  1   Preterm  0   AB  0   Living  1     SAB  0   TAB  0   Ectopic  0   Multiple  0   Live Births  1  Home Medications    Prior to Admission medications   Medication Sig Start Date End Date Taking? Authorizing Provider  ADDERALL XR 20 MG 24 hr capsule TAKE ONE CAPSULE (20MG  TOTAL) BY MOUTH EVERY MORNING 07/12/19   Patillas, 13/3/20, MD  cephALEXin (KEFLEX) 500 MG capsule Take 1 capsule (500 mg total) by mouth 3 (three) times daily for 7 days. 07/26/19 08/02/19  08/04/19 A, PA-C  ibuprofen (ADVIL,MOTRIN) 600 MG tablet Take 1 tablet (600 mg total) by mouth every 6 (six) hours. 05/01/16   Mikell, 05/03/16, MD  methylcellulose (CITRUCEL) oral powder Take 1 packet by mouth daily. 06/28/19   Armbruster, 06/30/19, MD  norgestimate-ethinyl estradiol [facility]) 0.25-35 MG-MCG tablet TAKE ONE (1) TABLET EACH DAY 08/30/18   09/01/18 A, NP  omeprazole (PRILOSEC) 40 MG capsule Take 1 capsule (40 mg total) by mouth daily. 06/28/19    Armbruster, 06/30/19, MD  phenazopyridine (PYRIDIUM) 95 MG tablet Take 1 tablet (95 mg total) by mouth 3 (three) times daily as needed for pain. 07/26/19   07/28/19, PA-C    Family History Family History  Problem Relation Age of Onset   Glaucoma Maternal Grandmother    Hypertension Maternal Grandmother    Anxiety disorder Maternal Grandmother    Deafness Mother    Arthritis Mother    Hyperlipidemia Mother    Dementia Paternal Grandmother    COPD Maternal Grandfather    Heart attack Father    Deafness Father     Social History Social History   Tobacco Use   Smoking status: Current Every Day Smoker    Years: 0.20   Smokeless tobacco: Never Used  Substance Use Topics   Alcohol use: No    Alcohol/week: 0.0 standard drinks    Comment: occ- daily for the past week   Drug use: No     Allergies   Patient has no known allergies.   Review of Systems Review of Systems  Constitutional: Negative for chills and fever.  Respiratory: Negative for shortness of breath.   Cardiovascular: Negative for chest pain.  Gastrointestinal: Positive for abdominal pain. Negative for nausea.       +dark stools  Genitourinary: Positive for dysuria, frequency and hematuria. Negative for urgency.  All other systems reviewed and are negative.    Physical Exam Updated Vital Signs BP 112/84 (BP Location: Right Arm)    Pulse 94    Temp 99 F (37.2 C) (Oral)    Resp 20    Ht 5\' 7"  (1.702 m)    Wt 70.8 kg    LMP 07/05/2019    SpO2 100%    BMI 24.43 kg/m   Physical Exam Vitals signs and nursing note reviewed.  Constitutional:      General: She is not in acute distress.    Appearance: She is well-developed.  HENT:     Head: Normocephalic and atraumatic.  Eyes:     General:        Right eye: No discharge.        Left eye: No discharge.     Conjunctiva/sclera: Conjunctivae normal.  Neck:     Vascular: No JVD.     Trachea: No tracheal deviation.  Cardiovascular:     Rate  and Rhythm: Normal rate and regular rhythm.  Pulmonary:     Effort: Pulmonary effort is normal.     Breath sounds: Normal breath sounds.  Abdominal:     General: Abdomen is flat. Bowel sounds are  normal. There is no distension.     Palpations: Abdomen is soft.     Tenderness: There is generalized abdominal tenderness. There is no right CVA tenderness, left CVA tenderness, guarding or rebound.  Genitourinary:    Rectum: Guaiac result positive.     Comments: Examination performed in the presence of a chaperone.  No masses or lesions to the external genitalia.  Moderate amount of thick white discharge in the vaginal vault.  No cervical motion tenderness or adnexal tenderness.  She has a small external hemorrhoid on rectal exam but this does not appear to be thrombosed or actively bleeding and is nontender.  Scant amount of stool in the rectal vault does not appear to be obviously bloody. Skin:    Findings: No erythema.  Neurological:     Mental Status: She is alert.  Psychiatric:        Behavior: Behavior normal.      ED Treatments / Results  Labs (all labs ordered are listed, but only abnormal results are displayed) Labs Reviewed  WET PREP, GENITAL - Abnormal; Notable for the following components:      Result Value   Clue Cells Wet Prep HPF POC PRESENT (*)    WBC, Wet Prep HPF POC MANY (*)    All other components within normal limits  URINALYSIS, ROUTINE W REFLEX MICROSCOPIC - Abnormal; Notable for the following components:   APPearance CLOUDY (*)    Hgb urine dipstick LARGE (*)    Protein, ur 100 (*)    Leukocytes,Ua MODERATE (*)    RBC / HPF >50 (*)    WBC, UA >50 (*)    Non Squamous Epithelial 0-5 (*)    All other components within normal limits  CBC WITH DIFFERENTIAL/PLATELET - Abnormal; Notable for the following components:   WBC 14.5 (*)    Neutro Abs 11.6 (*)    All other components within normal limits  COMPREHENSIVE METABOLIC PANEL - Abnormal; Notable for the  following components:   Calcium 8.7 (*)    Total Protein 6.4 (*)    AST 13 (*)    All other components within normal limits  POC OCCULT BLOOD, ED - Abnormal; Notable for the following components:   Fecal Occult Bld POSITIVE (*)    All other components within normal limits  URINE CULTURE  PREGNANCY, URINE  GC/CHLAMYDIA PROBE AMP ([place]) NOT AT [facility]    EKG None  Radiology No results found.  Procedures Procedures (including critical care time)  Medications Ordered in ED Medications  cephALEXin (KEFLEX) capsule 500 mg (has no administration in time range)     Initial Impression / Assessment and Plan / ED Course  I have reviewed the triage vital signs and the nursing notes.  Pertinent labs & imaging results that were available during my care of the patient were reviewed by me and considered in my medical decision making (see chart for details).        Patient presenting for evaluation of hematuria and urinary symptoms since this morning, black/dark stools for for 5 days.  She is afebrile, vital signs are stable.  She is nontoxic in appearance.  She is resting comfortably in no apparent distress on my initial assessment.  She does have generalized abdominal pain on examination but no focal tenderness, no peritoneal signs on examination of the abdomen.  Pelvic exam shows moderate amount of discharge but no concern for PID in the absence of cervical motion tenderness or adnexal tenderness.  GC chlamydia  cultures were obtained.  Wet prep shows clue cells present but she has no complaint of vaginal discharge so given she is asymptomatic we will hold on treatment at this time.  She has obvious UTI on UA.  No CVA tenderness to suggest pyelonephritis or infected stone.  Will culture urine.  Remainder of blood work reviewed by me shows leukocytosis, no anemia, no metabolic derangements, no renal insufficiency, no elevation in lipase or LFTs.  She has been followed by GI for several  months of postprandial nausea and vomiting, bloating, change in stool.  No evidence of frank rectal bleeding on examination but she does have heme positive stools on guaiac today.  However given she is hemodynamically stable with normal hemoglobin and hematocrit no further emergent work-up required at this time.  She does have an appointment today at 2 PM to see her PCP to discuss her dark stools and she does have follow-up with GI scheduled as well.  Recommend that she keep her appointment with her PCP today for further evaluation and management.  We will start the patient on Keflex for her UTI.  No evidence of acute surgical abdominal pathology on work-up today.  She is tolerating p.o. fluids in the ED without difficulty.  Discussed strict ED return precautions.  Patient verbalized understanding of and agreement with plan and patient stable for discharge home at this time.  Final Clinical Impressions(s) / ED Diagnoses   Final diagnoses:  Acute cystitis with hematuria  Guaiac positive stools    ED Discharge Orders         Ordered    cephALEXin (KEFLEX) 500 MG capsule  3 times daily     07/26/19 0943    phenazopyridine (PYRIDIUM) 95 MG tablet  3 times daily PRN     07/26/19 0943           Jeanie Sewer, PA-C 07/26/19 1610    Vanetta Mulders, MD 07/31/19 [phone]

## 2019-07-26 NOTE — Discharge Instructions (Signed)
Please take all of your antibiotics until finished!   Take your antibiotics with food.  Common side effects of antibiotics include nausea, vomiting, abdominal discomfort, and diarrhea. You may help offset some of this with probiotics which you can buy or get in yogurt. Do not eat  or take the probiotics until 2 hours after your antibiotic.    Some studies suggest that certain antibiotics can reduce the efficacy of certain oral contraceptive pills (birth control), so please use additional contraceptives (condoms or other barrier method) while you are taking the antibiotics and for an additional 5 to 7 days afterwards if you are a female on these medications.  You can take Pyridium as needed for pain with urination.  Be aware this medication will cause your urine to turn orange but this is a known side effect.  You did have some blood in your stools today however your hemoglobin levels are normal.  Please follow-up with your PCP today as scheduled for reevaluation of your symptoms and for further management.  Continue to follow-up with GI for reevaluation as well.  Return to the emergency department if any concerning signs or symptoms develop such as fevers, persistent vomiting, severe back pain, lightheadedness or weakness or loss of consciousness.

## 2019-07-27 LAB — GC/CHLAMYDIA PROBE AMP (~~LOC~~) NOT AT ARMC
Chlamydia: NEGATIVE
Neisseria Gonorrhea: NEGATIVE

## 2019-07-28 LAB — URINE CULTURE: Culture: 20000 — AB

## 2019-08-02 ENCOUNTER — Ambulatory Visit: Payer: BC Managed Care – PPO | Admitting: Family Medicine

## 2019-08-22 ENCOUNTER — Other Ambulatory Visit: Payer: Self-pay | Admitting: Family Medicine

## 2019-08-22 DIAGNOSIS — F902 Attention-deficit hyperactivity disorder, combined type: Secondary | ICD-10-CM

## 2019-08-22 NOTE — Telephone Encounter (Signed)
Ok to refill??  Last office visit 06/01/2019.  Last refill 07/12/2019.

## 2019-08-25 ENCOUNTER — Other Ambulatory Visit: Payer: Self-pay | Admitting: Advanced Practice Midwife

## 2019-08-25 ENCOUNTER — Telehealth: Payer: Self-pay | Admitting: Adult Health

## 2019-08-25 ENCOUNTER — Telehealth: Payer: Self-pay | Admitting: *Deleted

## 2019-08-25 MED ORDER — NORGESTIMATE-ETH ESTRADIOL 0.25-35 MG-MCG PO TABS: ORAL_TABLET | ORAL | 1 refills | Status: DC

## 2019-08-25 NOTE — Telephone Encounter (Signed)
Patient was scheduled with Nicole [name] tomorrow but appt had to be rescheduled due to her being out of the office.  She is requesting a 1 month supply for her BCP until her appt in January as she will be out of pills before then.

## 2019-08-25 NOTE — Telephone Encounter (Signed)
Unable to reach pt with restrictions.  

## 2019-08-26 ENCOUNTER — Ambulatory Visit: Payer: BC Managed Care – PPO | Admitting: Adult Health

## 2019-08-31 ENCOUNTER — Telehealth: Payer: Self-pay

## 2019-08-31 ENCOUNTER — Telehealth: Payer: Self-pay | Admitting: Gastroenterology

## 2019-08-31 ENCOUNTER — Ambulatory Visit: Payer: BC Managed Care – PPO | Admitting: Gastroenterology

## 2019-08-31 ENCOUNTER — Encounter: Payer: Self-pay | Admitting: Gastroenterology

## 2019-08-31 ENCOUNTER — Other Ambulatory Visit: Payer: Self-pay

## 2019-08-31 ENCOUNTER — Other Ambulatory Visit (INDEPENDENT_AMBULATORY_CARE_PROVIDER_SITE_OTHER): Payer: BC Managed Care – PPO

## 2019-08-31 VITALS — BP 110/70 | HR 80 | Temp 96.5°F | Ht 67.0 in | Wt 157.8 lb

## 2019-08-31 DIAGNOSIS — R109 Unspecified abdominal pain: Secondary | ICD-10-CM

## 2019-08-31 DIAGNOSIS — R112 Nausea with vomiting, unspecified: Secondary | ICD-10-CM

## 2019-08-31 DIAGNOSIS — R195 Other fecal abnormalities: Secondary | ICD-10-CM

## 2019-08-31 DIAGNOSIS — R194 Change in bowel habit: Secondary | ICD-10-CM | POA: Diagnosis not present

## 2019-08-31 DIAGNOSIS — Z1159 Encounter for screening for other viral diseases: Secondary | ICD-10-CM

## 2019-08-31 LAB — CBC WITH DIFFERENTIAL/PLATELET
Basophils Absolute: 0 10*3/uL (ref 0.0–0.1)
Basophils Relative: 0.3 % (ref 0.0–3.0)
Eosinophils Absolute: 0.1 10*3/uL (ref 0.0–0.7)
Eosinophils Relative: 1.2 % (ref 0.0–5.0)
HCT: 42 % (ref 36.0–46.0)
Hemoglobin: 14.1 g/dL (ref 12.0–15.0)
Lymphocytes Relative: 32.6 % (ref 12.0–46.0)
Lymphs Abs: 2.2 10*3/uL (ref 0.7–4.0)
MCHC: 33.6 g/dL (ref 30.0–36.0)
MCV: 94.2 fl (ref 78.0–100.0)
Monocytes Absolute: 0.6 10*3/uL (ref 0.1–1.0)
Monocytes Relative: 9.3 % (ref 3.0–12.0)
Neutro Abs: 3.8 10*3/uL (ref 1.4–7.7)
Neutrophils Relative %: 56.6 % (ref 43.0–77.0)
Platelets: 164 10*3/uL (ref 150.0–400.0)
RBC: 4.46 Mil/uL (ref 3.87–5.11)
RDW: 13.2 % (ref 11.5–15.5)
WBC: 6.8 10*3/uL (ref 4.0–10.5)

## 2019-08-31 MED ORDER — OMEPRAZOLE 40 MG PO CPDR: 40.0000 mg | DELAYED_RELEASE_CAPSULE | Freq: Every day | ORAL | 3 refills | Status: DC

## 2019-08-31 MED ORDER — NA SULFATE-K SULFATE-MG SULF 17.5-3.13-1.6 GM/177ML PO SOLN: 1.0000 | Freq: Once | ORAL | 0 refills | Status: AC

## 2019-08-31 NOTE — Telephone Encounter (Signed)
Patient called back stated she missed you again and will now be more attentive to her phone for your call. Thanks

## 2019-08-31 NOTE — Progress Notes (Signed)
HPI :  23 year old female here for follow-up visit.   I previously saw her on October 20 for multiple symptoms.  At that time she was having significant postprandial nausea and vomiting with abdominal bloating.  Also had been experiencing alternating bowels with constipation and loose stools.  I had recommended a trial of a low FODMAP diet, recommend a trial of omeprazole 40 mg once a day, and Zofran as needed.  I also had recommended an endoscopy given her nausea and vomiting, with plans to rule out celiac disease.  Unfortunately she was not able to follow-up and have that exam done.  She states about a few weeks after she saw me, sometime in December she was having very dark black stools with every one of her bowel movements for approximately 2 weeks.  She was having roughly 1 bowel movement per day.  Stool was somewhat formed but not tarry / sticky.  She denied using any NSAIDs at that time, denied any iron supplementation or Pepto-Bismol.  She was not using any NSAIDs.  She states she had also noticed some bright red blood in her stools at times.  She did not have much abdominal pain in her stomach at the time.  She was seen in the emergency room on November 17, she also endorsed hematuria at that time and was diagnosed with a UTI which resolved those symptoms.  She had stool tested for occult blood which was positive during that visit.  Her hemoglobin was normal.  She continues to have digestive complaints that bother her.  If she eats too much she feels full easily and has early satiety.  She has nausea frequently after she eats and will vomit if she eats too much.  She has not been having much heartburn or pyrosis lately.  She has been eating less to minimize her symptoms, her weight has fluctuated, no progressive weight loss.  She has had some left lower quadrant pain over the past week.  Sometimes at night she also has abdominal pains that bother her in her bilateral sides.  She states these  pains can sometimes be related with a bowel movement perhaps related to gas.  She denies any family history of colon cancer.  Her grandparents had ulcers.  Lately she has not seen any dark stools for the past week or so at least, but she has noticed some reddish tinge to the stools at times.  Of note she had not been taking omeprazole as previously discussed, she did not pick up this prescription, nor did she try the Zofran.  She does not drink any alcohol, she does smoke tobacco at half a pack a day.  She denies any marijuana use.  She had an H. pylori breath test that was negative.  She underwent a right upper quadrant ultrasound on 06/08/19 which was negative, no gallstones.   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  She endorses a lot of stress in her life.  She has been on Adderall for ADD over the past year   Past Medical History:  Diagnosis Date  . ADD (attention deficit disorder)   . BV (bacterial vaginosis) 03/18/2013   Treat ed with flagyl  . Contraceptive management 05/29/2015  . Encounter for insertion of mirena IUD 07/11/2016   07/11/16   . Encounter for management of intrauterine contraceptive device (IUD) 08/20/2016  . Encounter for surveillance of contraceptive pills 08/30/2018  . History of prior pregnancy with IUGR newborn 06/09/2016  .  Irregular bleeding 08/08/2016  . Supervision of normal pregnancy in first trimester 10/05/2015    [facility]  Initiated Care at   9+6 weeks  FOB  Dorian HeckleJonathan Hallett 23 yo WM  Dating By  LMP and US  Pap  10/05/15 negative +yeast  GC/CT Initial:     -/-           36+wks:  Genetic Screen AFP  CF screen   1 variant detected ; FOB declines testing  Anatomic US   Flu vaccine  10/05/15  Tdap Recommended ~ 28wks  Glucose Screen  2 hr  GBS   Feed Preference   Contraception   Circumcision   Childbirth Classes   Pediatrician      . Vertigo      History reviewed. No pertinent surgical history. Family History  Problem Relation  Age of Onset  . Glaucoma Maternal Grandmother   . Hypertension Maternal Grandmother   . Anxiety disorder Maternal Grandmother   . Deafness Mother   . Arthritis Mother   . Hyperlipidemia Mother   . Dementia Paternal Grandmother   . COPD Maternal Grandfather   . GER disease Maternal Grandfather   . Heart attack Father   . Deafness Father   . Colon cancer Neg Hx   . Pancreatic cancer Neg Hx   . Stomach cancer Neg Hx   . Esophageal cancer Neg Hx   . Liver disease Neg Hx    Social History   Tobacco Use  . Smoking status: Current Every Day Smoker    Years: 0.20  . Smokeless tobacco: Never Used  Substance Use Topics  . Alcohol use: Yes    Comment: 2-3 drinks per week  . Drug use: No   Current Outpatient Medications  Medication Sig Dispense Refill  . ADDERALL XR 20 MG 24 hr capsule TAKE ONE CAPSULE (20MG  TOTAL) BY MOUTH EVERY MORNING 30 capsule 0  . ibuprofen (ADVIL,MOTRIN) 600 MG tablet Take 1 tablet (600 mg total) by mouth every 6 (six) hours. 60 tablet 0  . norgestimate-ethinyl estradiol (MONO-LINYAH) 0.25-35 MG-MCG tablet TAKE ONE (1) TABLET EACH DAY 28 tablet 1   No current facility-administered medications for this visit.   No Known Allergies   Review of Systems: All systems reviewed and negative except where noted in HPI.    No results found.  Physical Exam: BP 110/70   Pulse 80   Temp (!) 96.5 F (35.8 C)   Ht 5\' 7"  (1.702 m)   Wt 157 lb 12.8 oz (71.6 kg)   BMI 24.71 kg/m  Constitutional: Pleasant,well-developed, female in no acute distress. HEENT: Normocephalic and atraumatic. Conjunctivae are normal. No scleral icterus. Neck supple.  Cardiovascular: Normal rate, regular rhythm.  Pulmonary/chest: Effort normal and breath sounds normal. No wheezing, rales or rhonchi. Abdominal: Soft, nondistended, nontender. There are no masses palpable. No hepatomegaly. Extremities: no edema Lymphadenopathy: No cervical adenopathy noted. Neurological: Alert and oriented  to person place and time. Skin: Skin is warm and dry. No rashes noted. Psychiatric: Normal mood and affect. Behavior is normal.   ASSESSMENT AND PLAN: 23 year old female here for reassessment of the following:  Dark stool / change in bowel habits / (+) FOBT / abdominal pains / nausea with vomiting -  Constellation of symptoms as outlined above.  I previously had recommended trial of omeprazole and Zofran, with endoscopy for her recurrent nausea and vomiting, unfortunately this was not done and she has not been able to try the omeprazole and Zofran yet.  Now having some dark stools, and although not overtly consistent with melena, they were heme positive.  She endorsed having these dark stools for at least a few weeks, I will check a CBC today to make sure she is not anemic, her last hemoglobin was normal in the setting of this in the emergency department.  I discussed differential with her regarding all the symptoms.  I am recommending an endoscopy and colonoscopy to evaluate her bowel for bleeding source, and cause for her abdominal pain and change in bowel habits.  I discussed these exams with her to include risk and benefits of them and of anesthesia, she wanted to proceed.  In interim I strongly recommend she take omeprazole 40 mg once daily in light of the symptoms, she declined at this time given her stool color is normal again and she does not like taking medications.  She knows it is in the pharmacy there for her and she agreed to take it if her symptoms recur.  I otherwise offered her Zofran for the nausea which she also declined.  We will await the results of her procedures and hopefully her hemoglobin is stable in the interim.  [place] Cellar, MD [facility]

## 2019-08-31 NOTE — Telephone Encounter (Signed)
Attempted to call patient back, no answer and no voice mail 

## 2019-08-31 NOTE — Patient Instructions (Addendum)
If you are age 25 or older, your body mass index should be between 23-30. Your Body mass index is 24.71 kg/m. If this is out of the aforementioned range listed, please consider follow up with your Primary Care Provider.  If you are age 68 or younger, your body mass index should be between 19-25. Your Body mass index is 24.71 kg/m. If this is out of the aformentioned range listed, please consider follow up with your Primary Care Provider.   Your provider has requested that you go to the basement level for lab work before leaving today. Press "B" on the elevator. The lab is located at the first door on the left as you exit the elevator.  We have sent the following medications to your pharmacy for you to pick up at your convenience: Omeprazole

## 2019-08-31 NOTE — Telephone Encounter (Signed)
Patient was calling to follow up with her visit today had missed call from our office  but not sure from whom doesn't have answering machine nor voicemail.

## 2019-09-08 ENCOUNTER — Encounter: Payer: Self-pay | Admitting: Gastroenterology

## 2019-09-13 ENCOUNTER — Encounter: Payer: Self-pay | Admitting: Gastroenterology

## 2019-09-15 ENCOUNTER — Telehealth: Payer: Self-pay | Admitting: Adult Health

## 2019-09-15 MED ORDER — NORGESTIMATE-ETH ESTRADIOL 0.25-35 MG-MCG PO TABS: ORAL_TABLET | ORAL | 1 refills | Status: DC

## 2019-09-15 NOTE — Telephone Encounter (Signed)
Will refill oCs

## 2019-09-15 NOTE — Telephone Encounter (Signed)
Pt is coming late jan for her pap she does need a refill on bc pills called into [facility]/thank you

## 2019-09-15 NOTE — Addendum Note (Signed)
Addended by: Cyril Mourning A on: 09/15/2019 02:19 PM   Modules accepted: Orders

## 2019-09-15 NOTE — Telephone Encounter (Signed)
Tried to reach the patient to remind her of her appointment/restrictions, voicemail not set up.

## 2019-09-16 ENCOUNTER — Ambulatory Visit: Payer: BC Managed Care – PPO | Admitting: Adult Health

## 2019-09-20 DIAGNOSIS — F909 Attention-deficit hyperactivity disorder, unspecified type: Secondary | ICD-10-CM | POA: Diagnosis not present

## 2019-09-21 ENCOUNTER — Other Ambulatory Visit: Payer: Self-pay | Admitting: Gastroenterology

## 2019-09-21 DIAGNOSIS — Z1159 Encounter for screening for other viral diseases: Secondary | ICD-10-CM | POA: Diagnosis not present

## 2019-09-22 ENCOUNTER — Ambulatory Visit (INDEPENDENT_AMBULATORY_CARE_PROVIDER_SITE_OTHER): Payer: BC Managed Care – PPO

## 2019-09-22 DIAGNOSIS — Z1159 Encounter for screening for other viral diseases: Secondary | ICD-10-CM

## 2019-09-22 DIAGNOSIS — R194 Change in bowel habit: Secondary | ICD-10-CM | POA: Diagnosis not present

## 2019-09-22 LAB — SARS CORONAVIRUS 2 (TAT 6-24 HRS): SARS Coronavirus 2: NEGATIVE

## 2019-09-23 ENCOUNTER — Other Ambulatory Visit: Payer: Self-pay | Admitting: Family Medicine

## 2019-09-23 DIAGNOSIS — F902 Attention-deficit hyperactivity disorder, combined type: Secondary | ICD-10-CM

## 2019-09-23 NOTE — Telephone Encounter (Signed)
Requested Prescriptions   Pending Prescriptions Disp Refills  . ADDERALL XR 20 MG 24 hr capsule [Pharmacy Med Name: ADDERALL XR 20 MG CAP] 30 capsule 0    Sig: TAKE ONE CAPSULE (20MG  TOTAL) BY MOUTH EVERY MORNING    Last OV 06/01/2019   Last written 08/22/2019

## 2019-09-27 ENCOUNTER — Other Ambulatory Visit: Payer: Self-pay

## 2019-09-27 ENCOUNTER — Ambulatory Visit (AMBULATORY_SURGERY_CENTER): Payer: BC Managed Care – PPO | Admitting: Gastroenterology

## 2019-09-27 ENCOUNTER — Encounter: Payer: Self-pay | Admitting: Gastroenterology

## 2019-09-27 VITALS — BP 103/67 | HR 69 | Temp 98.4°F | Resp 18 | Ht 67.0 in | Wt 157.0 lb

## 2019-09-27 DIAGNOSIS — R194 Change in bowel habit: Secondary | ICD-10-CM

## 2019-09-27 DIAGNOSIS — R109 Unspecified abdominal pain: Secondary | ICD-10-CM

## 2019-09-27 DIAGNOSIS — R112 Nausea with vomiting, unspecified: Secondary | ICD-10-CM | POA: Diagnosis not present

## 2019-09-27 DIAGNOSIS — R195 Other fecal abnormalities: Secondary | ICD-10-CM

## 2019-09-27 DIAGNOSIS — K648 Other hemorrhoids: Secondary | ICD-10-CM

## 2019-09-27 MED ORDER — SODIUM CHLORIDE 0.9 % IV SOLN: 500.0000 mL | Freq: Once | INTRAVENOUS | Status: DC

## 2019-09-27 MED ORDER — DICYCLOMINE HCL 10 MG PO CAPS: 10.0000 mg | ORAL_CAPSULE | Freq: Three times a day (TID) | ORAL | 3 refills | Status: DC

## 2019-09-27 NOTE — Progress Notes (Signed)
Report to PACU, RN, vss, BBS= Clear.  

## 2019-09-27 NOTE — Progress Notes (Signed)
Called to room to assist during endoscopic procedure.  Patient ID and intended procedure confirmed with present staff. Received instructions for my participation in the procedure from the performing physician.  

## 2019-09-27 NOTE — Op Note (Signed)
[facility] Patient Name: Nicole [name] Procedure Date: 09/27/2019 2:27 PM MRN: [id number] Endoscopist: Viviann Spare P. Adela Lank , MD Age: 24 Referring MD:  Date of Birth: [birth year] Gender: Female Account #: [id number] Procedure:                Upper GI endoscopy Indications:              Abdominal pain, Heme positive stool, Nausea with                            vomiting Medicines:                Monitored Anesthesia Care Procedure:                Pre-Anesthesia Assessment:                           - Prior to the procedure, a History and Physical                            was performed, and patient medications and                            allergies were reviewed. The patient's tolerance of                            previous anesthesia was also reviewed. The risks                            and benefits of the procedure and the sedation                            options and risks were discussed with the patient.                            All questions were answered, and informed consent                            was obtained. Prior Anticoagulants: The patient has                            taken no previous anticoagulant or antiplatelet                            agents. ASA Grade Assessment: II - A patient with                            mild systemic disease. After reviewing the risks                            and benefits, the patient was deemed in                            satisfactory condition to undergo the procedure.  After obtaining informed consent, the endoscope was                            passed under direct vision. Throughout the                            procedure, the patient's blood pressure, pulse, and                            oxygen saturations were monitored continuously. The                            Endoscope was introduced through the mouth, and                            advanced to the second part of duodenum.  The upper                            GI endoscopy was accomplished without difficulty.                            The patient tolerated the procedure well. Scope In: Scope Out: Findings:                 Esophagogastric landmarks were identified: the                            Z-line was found at 37 cm, the gastroesophageal                            junction was found at 37 cm and the upper extent of                            the gastric folds was found at 37 cm from the                            incisors.                           The exam of the esophagus was otherwise normal.                           The entire examined stomach was normal. Biopsies                            were taken with a cold forceps for histology.                           The duodenal bulb and second portion of the                            duodenum were normal. Biopsies for histology were  taken with a cold forceps for evaluation of celiac                            disease. Complications:            No immediate complications. Estimated blood loss:                            Minimal. Estimated Blood Loss:     Estimated blood loss was minimal. Impression:               - Esophagogastric landmarks identified.                           - Normal esophagus otherwise.                           - Normal stomach. Biopsied to rule out H pylori.                           - Normal duodenal bulb and second portion of the                            duodenum. Biopsied to rule out celiac disease. Recommendation:           - Patient has a contact number available for                            emergencies. The signs and symptoms of potential                            delayed complications were discussed with the                            patient. Return to normal activities tomorrow.                            Written discharge instructions were provided to the                             patient.                           - Resume previous diet.                           - Continue present medications.                           - Await pathology results with further                            recommendations. Will discuss options with patient                            if the biopsies are negative and symptoms persist  despite regimen thus far. Remo Lipps P. Rosha Cocker, MD 09/27/2019 3:07:33 PM This report has been signed electronically.

## 2019-09-27 NOTE — Patient Instructions (Addendum)
YOU HAD AN ENDOSCOPIC PROCEDURE TODAY AT THE [facility]:   Refer to the procedure report that was given to you for any specific questions about what was found during the examination.  If the procedure report does not answer your questions, please call your gastroenterologist to clarify.  If you requested that your care partner not be given the details of your procedure findings, then the procedure report has been included in a sealed envelope for you to review at your convenience later.  YOU SHOULD EXPECT: Some feelings of bloating in the abdomen. Passage of more gas than usual.  Walking can help get rid of the air that was put into your GI tract during the procedure and reduce the bloating. If you had a lower endoscopy (such as a colonoscopy or flexible sigmoidoscopy) you may notice spotting of blood in your stool or on the toilet paper. If you underwent a bowel prep for your procedure, you may not have a normal bowel movement for a few days.  Please Note:  You might notice some irritation and congestion in your nose or some drainage.  This is from the oxygen used during your procedure.  There is no need for concern and it should clear up in a day or so.  SYMPTOMS TO REPORT IMMEDIATELY:   Following lower endoscopy (colonoscopy or flexible sigmoidoscopy):  Excessive amounts of blood in the stool  Significant tenderness or worsening of abdominal pains  Swelling of the abdomen that is new, acute  Fever of 100F or higher   Following upper endoscopy (EGD)  Vomiting of blood or coffee ground material  New chest pain or pain under the shoulder blades  Painful or persistently difficult swallowing  New shortness of breath  Fever of 100F or higher  Black, tarry-looking stools  For urgent or emergent issues, a gastroenterologist can be reached at any hour by calling [phone].   DIET:  We do recommend a small meal at first, but then you may proceed to your regular diet.  Drink  plenty of fluids but you should avoid alcoholic beverages for 24 hours.  MEDICATIONS: Continue present medications. Try Bentyl 10 mg 1 to 2 tabs by mouth every 8 hours as needed.  Please see handouts given to you by your recovery nurse.  ACTIVITY:  You should plan to take it easy for the rest of today and you should NOT DRIVE or use heavy machinery until tomorrow (because of the sedation medicines used during the test).    FOLLOW UP: Our staff will call the number listed on your records 48-72 hours following your procedure to check on you and address any questions or concerns that you may have regarding the information given to you following your procedure. If we do not reach you, we will leave a message.  We will attempt to reach you two times.  During this call, we will ask if you have developed any symptoms of COVID 19. If you develop any symptoms (ie: fever, flu-like symptoms, shortness of breath, cough etc.) before then, please call [phone].  If you test positive for Covid 19 in the 2 weeks post procedure, please call and report this information to [place].    If any biopsies were taken you will be contacted by phone or by letter within the next 1-3 weeks.  Please call us at [phone] if you have not heard about the biopsies in 3 weeks.   Thank you for allowing [place] to provide for your healthcare  needs today.   SIGNATURES/CONFIDENTIALITY: You and/or your care partner have signed paperwork which will be entered into your electronic medical record.  These signatures attest to the fact that that the information above on your After Visit Summary has been reviewed and is understood.  Full responsibility of the confidentiality of this discharge information lies with you and/or your care-partner.

## 2019-09-27 NOTE — Op Note (Signed)
[facility] Patient Name: Nicole [name] Procedure Date: 09/27/2019 2:26 PM MRN: [id number] Endoscopist: Remo Lipps P. Havery Moros , MD Age: 24 Referring MD:  Date of Birth: Nov 01, 1995 Gender: Female Account #: [id number] Procedure:                Colonoscopy Indications:              Heme positive stool, Abdominal pain, Change in                            bowel habits Medicines:                Monitored Anesthesia Care Procedure:                Pre-Anesthesia Assessment:                           - Prior to the procedure, a History and Physical                            was performed, and patient medications and                            allergies were reviewed. The patient's tolerance of                            previous anesthesia was also reviewed. The risks                            and benefits of the procedure and the sedation                            options and risks were discussed with the patient.                            All questions were answered, and informed consent                            was obtained. Prior Anticoagulants: The patient has                            taken no previous anticoagulant or antiplatelet                            agents. ASA Grade Assessment: II - A patient with                            mild systemic disease. After reviewing the risks                            and benefits, the patient was deemed in                            satisfactory condition to undergo the procedure.  After obtaining informed consent, the colonoscope                            was passed under direct vision. Throughout the                            procedure, the patient's blood pressure, pulse, and                            oxygen saturations were monitored continuously. The                            Colonoscope was introduced through the anus and                            advanced to the the terminal ileum, with                         identification of the appendiceal orifice and IC                            valve. The colonoscopy was performed without                            difficulty. The patient tolerated the procedure                            well. The quality of the bowel preparation was                            fair. The terminal ileum, ileocecal valve,                            appendiceal orifice, and rectum were photographed. Scope In: 2:43:23 PM Scope Out: 2:53:18 PM Scope Withdrawal Time: 0 hours 7 minutes 19 seconds  Total Procedure Duration: 0 hours 9 minutes 55 seconds  Findings:                 The perianal and digital rectal examinations were                            normal.                           The terminal ileum appeared normal. No inflammatory                            changes.                           A large amount of semi-liquid stool was found at                            the splenic flexure, in the transverse colon, at  the hepatic flexure, in the ascending colon and in                            the cecum. Worst affected was the right colon,                            making visualization difficult. Lavage of the colon                            was performed using copious amounts of sterile                            water, resulting in incomplete clearance with fair                            visualization of the right colon, could not clear                            it in entirety but no obvious large masses / polyps                            noted. Prep inadequate for smaller or flat lesions.                            Transverse colon was mostly well visualized, right                            sided had fair amount of residual stool. Prep of                            the left colon was adequate.                           Internal hemorrhoids were found during retroflexion.                           The exam was  otherwise without abnormality. Complications:            No immediate complications. Estimated blood loss:                            None. Estimated Blood Loss:     Estimated blood loss: none. Impression:               - Preparation of the colon was fair - worst in the                            right colon, no obvious abnormalities noted in the                            right colon but could not clear it in entirety.                           -  The examined portion of the ileum was normal.                           - Internal hemorrhoids.                           - The examination was otherwise normal.                           Hemorrhoids could have caused heme positive stools                            and symptoms of previous rectal bleeding. No cause                            of pain noted on this exam. Recommendation:           - Patient has a contact number available for                            emergencies. The signs and symptoms of potential                            delayed complications were discussed with the                            patient. Return to normal activities tomorrow.                            Written discharge instructions were provided to the                            patient.                           - Resume previous diet.                           - Continue present medications.                           - Will await pathology results for EGD. If negative                            and symptoms persist, consider cross sectional                            imaging. Viviann Spare P. Rae Plotner, MD 09/27/2019 3:02:51 PM This report has been signed electronically.

## 2019-09-29 ENCOUNTER — Telehealth: Payer: Self-pay | Admitting: Adult Health

## 2019-09-29 ENCOUNTER — Telehealth: Payer: Self-pay | Admitting: *Deleted

## 2019-09-29 ENCOUNTER — Telehealth: Payer: Self-pay

## 2019-09-29 NOTE — Telephone Encounter (Signed)
Pt returned call and said she is feeling okay from procedure

## 2019-09-29 NOTE — Telephone Encounter (Signed)
  Follow up Call-  Call back number 09/27/2019  Post procedure Call Back phone  # [phone]  Permission to leave phone message Yes  Some recent data might be hidden     Patient questions:  Do you have a fever, pain , or abdominal swelling? No. Pain Score  0 *  Have you tolerated food without any problems? Yes.    Have you been able to return to your normal activities? Yes.    Do you have any questions about your discharge instructions: Diet   No. Medications  No. Follow up visit  No.  Do you have questions or concerns about your Care? No.  Actions: * If pain score is 4 or above: No action needed, pain <4.  1. Have you developed a fever since your procedure? no  2.   Have you had an respiratory symptoms (SOB or cough) since your procedure? no  3.   Have you tested positive for COVID 19 since your procedure no  4.   Have you had any family members/close contacts diagnosed with the COVID 19 since your procedure?  no   If yes to any of these questions please route to Laverna Peace, RN and Jennye Boroughs, Charity fundraiser.

## 2019-09-29 NOTE — Telephone Encounter (Signed)
Tried to reach the patient to remind her of her appointment/restrictions, mailbox not setup. °

## 2019-09-29 NOTE — Telephone Encounter (Signed)
  Follow up Call-  Call back number 09/27/2019  Post procedure Call Back phone  # [phone]  Permission to leave phone message Yes  Some recent data might be hidden     No answer, voicemail not yet set up.

## 2019-09-30 ENCOUNTER — Other Ambulatory Visit: Payer: Self-pay

## 2019-09-30 ENCOUNTER — Encounter: Payer: Self-pay | Admitting: Adult Health

## 2019-09-30 ENCOUNTER — Other Ambulatory Visit (HOSPITAL_COMMUNITY)
Admission: RE | Admit: 2019-09-30 | Discharge: 2019-09-30 | Disposition: A | Discharge status: Home / Self Care | Payer: BC Managed Care – PPO | Source: Ambulatory Visit | Attending: Adult Health | Admitting: Adult Health

## 2019-09-30 ENCOUNTER — Ambulatory Visit: Payer: BC Managed Care – PPO | Admitting: Adult Health

## 2019-09-30 VITALS — BP 108/74 | HR 104 | Ht 66.25 in | Wt 156.0 lb

## 2019-09-30 DIAGNOSIS — Z01419 Encounter for gynecological examination (general) (routine) without abnormal findings: Secondary | ICD-10-CM | POA: Insufficient documentation

## 2019-09-30 DIAGNOSIS — F329 Major depressive disorder, single episode, unspecified: Secondary | ICD-10-CM

## 2019-09-30 DIAGNOSIS — F32A Depression, unspecified: Secondary | ICD-10-CM

## 2019-09-30 DIAGNOSIS — Z3041 Encounter for surveillance of contraceptive pills: Secondary | ICD-10-CM | POA: Insufficient documentation

## 2019-09-30 MED ORDER — NORGESTIMATE-ETH ESTRADIOL 0.25-35 MG-MCG PO TABS: ORAL_TABLET | ORAL | 12 refills | Status: DC

## 2019-09-30 NOTE — Progress Notes (Signed)
Patient ID: Nicole [name], female   DOB: [birth year], 24 y.o.   MRN: [id number] History of Present Illness:  Nicole [name] is a 24 year old white female, married, G1P1, in for a well woman gyn exam and pap.She has had ome GI issues lately and had a colonoscopy and EGD at [facility]. PCP is Dr Jeanice Lim.  Current Medications, Allergies, Past Medical History, Past Surgical History, Family History and Social History were reviewed in [employer] electronic medical record.     Review of Systems: Patient denies any headaches, hearing loss, fatigue, blurred vision, shortness of breath, chest pain, abdominal pain, problems with bowel movements, urination, or intercourse. No joint pain or mood swings. She is happy with her OCs, is does smoke a pack about every 3 days and is aware can't smoke and take COC after 35.    Physical Exam:BP 108/74 (BP Location: Left Arm, Patient Position: Sitting, Cuff Size: Normal)   Pulse (!) 104   Ht 5' 6.25" (1.683 m)   Wt 156 lb (70.8 kg)   LMP 09/26/2019   BMI 24.99 kg/m  General:  Well developed, well nourished, no acute distress Skin:  Warm and dry,has numerous tattoos Neck:  Midline trachea, normal thyroid, good ROM, no lymphadenopathy Lungs; Clear to auscultation bilaterally Breast:  No dominant palpable mass, retraction, or nipple discharge,bilateral nipple rods Cardiovascular: Regular rate and rhythm Abdomen:  Soft, non tender, no hepatosplenomegaly Pelvic:  External genitalia is normal in appearance, no lesions.  The vagina is normal in appearance,with period blood. Urethra has no lesions or masses. The cervix is bulbous.Ppa with GC/CHL and high risk HPV 16/18 genotyping performed.  Uterus is felt to be normal size, shape, and contour.  No adnexal masses or tenderness noted.Bladder is non tender, no masses felt. Extremities/musculoskeletal:  No swelling or varicosities noted, no clubbing or cyanosis Psych:  No mood changes, alert and cooperative,seems happy Fall  risk is low PHQ 9 score is 18, denies any SI, or HO is going to therapy. Examination chaperoned by Malachy Mood LPN.  Impression and Plan: 1. Encounter for gynecological examination with Papanicolaou smear of cervix Pap sent Physical in 1 year Pap in 3 if normal  2. Encounter for surveillance of contraceptive pills Will continue OCs Meds ordered this encounter  Medications  . norgestimate-ethinyl estradiol (MONO-LINYAH) 0.25-35 MG-MCG tablet    Sig: TAKE ONE (1) TABLET EACH DAY    Dispense:  28 tablet    Refill:  12    Order Specific Question:   Supervising Provider    Answer:   Despina Hidden, LUTHER H [[id number]]    3. Depression, unspecified depression type Continue therapy

## 2019-10-05 ENCOUNTER — Telehealth: Payer: Self-pay

## 2019-10-05 NOTE — Telephone Encounter (Signed)
-----   Message from Benancio Deeds, MD sent at 10/04/2019  4:23 PM EST ----- Darral Dash can you please let this patient know that her biopsies from the EGD look good - no H pylori, no celiac disease. Colonoscopy was somewhat limited by the prep but nothing concerning noted. I had asked that she try the omeprazole and Zofran for her nausea / dyspepsia. Not sure if she has done that. If not, would try it and see how she feels. If symptoms persist moving forward may need to consider CT imaging. She should keep [place] posted if she is not feeling well and can coordinate it if needed. Thanks

## 2019-10-05 NOTE — Telephone Encounter (Signed)
Called patient, no answer and no voice mail. Will try later 

## 2019-10-06 ENCOUNTER — Telehealth: Payer: Self-pay

## 2019-10-06 LAB — CYTOLOGY - PAP
Chlamydia: NEGATIVE
Comment: NEGATIVE
Comment: NEGATIVE
Comment: NEGATIVE
Comment: NEGATIVE
Comment: NORMAL
Diagnosis: NEGATIVE
Diagnosis: REACTIVE
HPV 16: NEGATIVE
HPV 18 / 45: NEGATIVE
High risk HPV: POSITIVE — AB
Neisseria Gonorrhea: NEGATIVE

## 2019-10-06 NOTE — Telephone Encounter (Signed)
Tried again to reach patient to give path results. Still no answer and no voice mail set-up.

## 2019-10-07 ENCOUNTER — Telehealth: Payer: Self-pay | Admitting: *Deleted

## 2019-10-07 NOTE — Telephone Encounter (Signed)
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 Will need repeat pap in 1 year.  All questions answered and placed on recall list.

## 2019-10-07 NOTE — Telephone Encounter (Signed)
Attempted to reach patient regarding pap.  Unable to leave VM.

## 2019-10-17 DIAGNOSIS — F909 Attention-deficit hyperactivity disorder, unspecified type: Secondary | ICD-10-CM | POA: Diagnosis not present

## 2019-10-19 ENCOUNTER — Telehealth: Payer: Self-pay | Admitting: *Deleted

## 2019-10-19 NOTE — Telephone Encounter (Signed)
Patient left message that she has stopped her birth control and has started bleeding a week before she should start her period. Would like a call back to discuss.

## 2019-10-19 NOTE — Telephone Encounter (Signed)
Pt is on birth control pills. Pt had bright red bleeding 1 week before period was due. Now it's brown. Pt was late taking pills at beginning of this pack and got caught up. I advised to see how she does on her next pack. Pt voiced understanding. JSY

## 2019-10-24 ENCOUNTER — Encounter: Payer: Self-pay | Admitting: Family Medicine

## 2019-10-24 ENCOUNTER — Ambulatory Visit (INDEPENDENT_AMBULATORY_CARE_PROVIDER_SITE_OTHER): Payer: BC Managed Care – PPO | Admitting: Family Medicine

## 2019-10-24 ENCOUNTER — Other Ambulatory Visit: Payer: Self-pay

## 2019-10-24 VITALS — BP 110/62 | HR 98 | Temp 98.1°F | Resp 14 | Ht 66.0 in | Wt 156.0 lb

## 2019-10-24 DIAGNOSIS — F331 Major depressive disorder, recurrent, moderate: Secondary | ICD-10-CM | POA: Diagnosis not present

## 2019-10-24 DIAGNOSIS — F902 Attention-deficit hyperactivity disorder, combined type: Secondary | ICD-10-CM | POA: Diagnosis not present

## 2019-10-24 MED ORDER — SERTRALINE HCL 25 MG PO TABS: ORAL_TABLET | ORAL | 1 refills | Status: DC

## 2019-10-24 MED ORDER — ADDERALL XR 30 MG PO CP24: 30.0000 mg | ORAL_CAPSULE | Freq: Every day | ORAL | 0 refills | Status: DC

## 2019-10-24 NOTE — Patient Instructions (Addendum)
Try the sertraline 25mg  once a day  Adderall 30mg  once a day  F/U 2 weeks Telehealth for medications

## 2019-10-24 NOTE — Progress Notes (Signed)
Subjective:    Patient ID: Nicole [name], female    DOB: [birth year], 24 y.o.   MRN: [id number]  Patient presents for Medication Review/ Refill (Adderrall not as effective)  Patient here to discuss medications.  She has history of ADD as well as major depressive disorder.  The past few months of very been very difficult for her.  Does not feel like her Adderall is working well.  States that this is happened in the past when she had her dose adjusted.  She is having difficulty focusing and concentrating at work.  She feels like she is not getting her job done.  She is also been showing up late.  She also feels like things are a mess at home as well and not as orderly as she would typically have them.  However she also admits that she has been very stressed and depressed at times.  She feels irritated by everyone around her.  She is having some difficulties in her relationship.  She has been talking to a counselor Ms. Hood at [facility] every 2 weeks.  She has been her psychotherapist for quite some time.  In the past she was seen by a psychiatrist she was given Wellbutrin but she had adverse reaction increase suicidal ideations she has been very hesitant about going on medication since been for her depression.  She feels like she does need to try something because of her depressed mood.  She also does not sleep very well and is not well rested. She denies feelings of anxiety.  Denies any panic attacks.  Reviewed the results of her GI work-up with endoscopy and colonoscopy she had internal hemorrhoids.  Review Of Systems:  GEN- denies fatigue, fever, weight loss,weakness, recent illness HEENT- denies eye drainage, change in vision, nasal discharge, CVS- denies chest pain, palpitations RESP- denies SOB, cough, wheeze ABD- denies N/V, change in stools, abd pain Neuro- denies headache, dizziness, syncope, seizure activity       Objective:    BP 110/62   Pulse 98   Temp 98.1 F (36.7  C) (Temporal)   Resp 14   Ht 5\' 6"  (1.676 m)   Wt 156 lb (70.8 kg)   LMP 09/26/2019   SpO2 99%   BMI 25.18 kg/m  GEN- NAD, alert and oriented x3 Psych normal affect and mood pleasant good eye contact well-groomed  PHQ-9 score is 0 given by the nurse.       Assessment & Plan:    Approximately 20 minutes spent with patient greater than 50% on medication reconciliation and counseling Problem List Items Addressed This Visit      Unprioritized   ADHD (attention deficit hyperactivity disorder), combined type - Primary    I do think that her Adderall dose likely needs to be adjusted in the setting of her difficulty with attention concentration.  But she also has underlying major depressive which makes this more complicated.  After further discussion we decided that we would try treatment for both her depression and increase her Adderall to 30 mg once a day.  For her depressive symptoms we will try her on Zoloft we will start at 25 mg at bedtime.  We will follow-up via phone in 2 weeks.  She will have a psychotherapy appointment next week.  I did discuss the side effects of the medication.      MDD (major depressive disorder)   Relevant Medications   sertraline (ZOLOFT) 25 MG tablet  Note: This dictation was prepared with Dragon dictation along with smaller phrase technology. Any transcriptional errors that result from this process are unintentional.

## 2019-10-24 NOTE — Assessment & Plan Note (Signed)
I do think that her Adderall dose likely needs to be adjusted in the setting of her difficulty with attention concentration.  But she also has underlying major depressive which makes this more complicated.  After further discussion we decided that we would try treatment for both her depression and increase her Adderall to 30 mg once a day.  For her depressive symptoms we will try her on Zoloft we will start at 25 mg at bedtime.  We will follow-up via phone in 2 weeks.  She will have a psychotherapy appointment next week.  I did discuss the side effects of the medication.

## 2019-10-31 DIAGNOSIS — F909 Attention-deficit hyperactivity disorder, unspecified type: Secondary | ICD-10-CM | POA: Diagnosis not present

## 2019-11-07 ENCOUNTER — Ambulatory Visit (INDEPENDENT_AMBULATORY_CARE_PROVIDER_SITE_OTHER): Payer: BC Managed Care – PPO | Admitting: Family Medicine

## 2019-11-07 DIAGNOSIS — F331 Major depressive disorder, recurrent, moderate: Secondary | ICD-10-CM

## 2019-11-07 DIAGNOSIS — Z91199 Patient's noncompliance with other medical treatment and regimen due to unspecified reason: Secondary | ICD-10-CM

## 2019-11-07 DIAGNOSIS — Z5329 Procedure and treatment not carried out because of patient's decision for other reasons: Secondary | ICD-10-CM

## 2019-11-07 DIAGNOSIS — F902 Attention-deficit hyperactivity disorder, combined type: Secondary | ICD-10-CM

## 2019-11-07 NOTE — Progress Notes (Signed)
Virtual Visit via Telephone Note  I connected with Nicole [name] on 11/07/19 at 12:00 PM EST by telephone and verified that I am speaking with the correct person using two identifiers.   I discussed the limitations, risks, security and privacy concerns of performing an evaluation and management service by telephone and the availability of in person appointments. I also discussed with the patient that there may be a patient responsible charge related to this service. The patient expressed understanding and agreed to proceed.    History of Present Illness:      Telehealth visit to follow-up medication change from 2 weeks ago.  Patient has history of ADHD as well as major depressive disorder.  She has been followed by psychologist for psychotherapy.  At her last visit I added Zoloft 25 mg at bedtime to help with the depressive symptoms and increase her Adderall to 30 mg once a day due to the concentration issues.   Observations/Objective:   Assessment and Plan: ADHD MDD-   Called pt no answer at 12:00, 12:03pm, unable to leave VM

## 2019-11-25 DIAGNOSIS — F909 Attention-deficit hyperactivity disorder, unspecified type: Secondary | ICD-10-CM | POA: Diagnosis not present

## 2019-11-28 ENCOUNTER — Other Ambulatory Visit: Payer: Self-pay | Admitting: Family Medicine

## 2019-11-28 NOTE — Telephone Encounter (Signed)
Last office visit 10/24/2019  Last refilled: 10/24/2019

## 2019-12-22 ENCOUNTER — Other Ambulatory Visit: Payer: Self-pay | Admitting: Family Medicine

## 2019-12-22 NOTE — Telephone Encounter (Signed)
Ok to refill??  Last office visit 11/07/2019.  Last refill 11/28/2019.

## 2019-12-23 IMAGING — US ULTRASOUND RIGHT BREAST LIMITED
1 series · 1 of 1 positions shown · non-contrast
Comparison: Baseline evaluation

CLINICAL DATA: Palpable abnormality in the 12 o'clock areolar
margin of the RIGHT breast noted on recent physical exam. 2 months
ago, patient had bilateral nipple piercings. Subsequently patient
developed bilateral breast infections which have resolved on
antibiotics.

EXAM:
ULTRASOUND OF THE RIGHT BREAST

[Series 1: ultrasound right breast limited · 0.05mm/px · 1 of 1 slices shown]
[im 1/1]
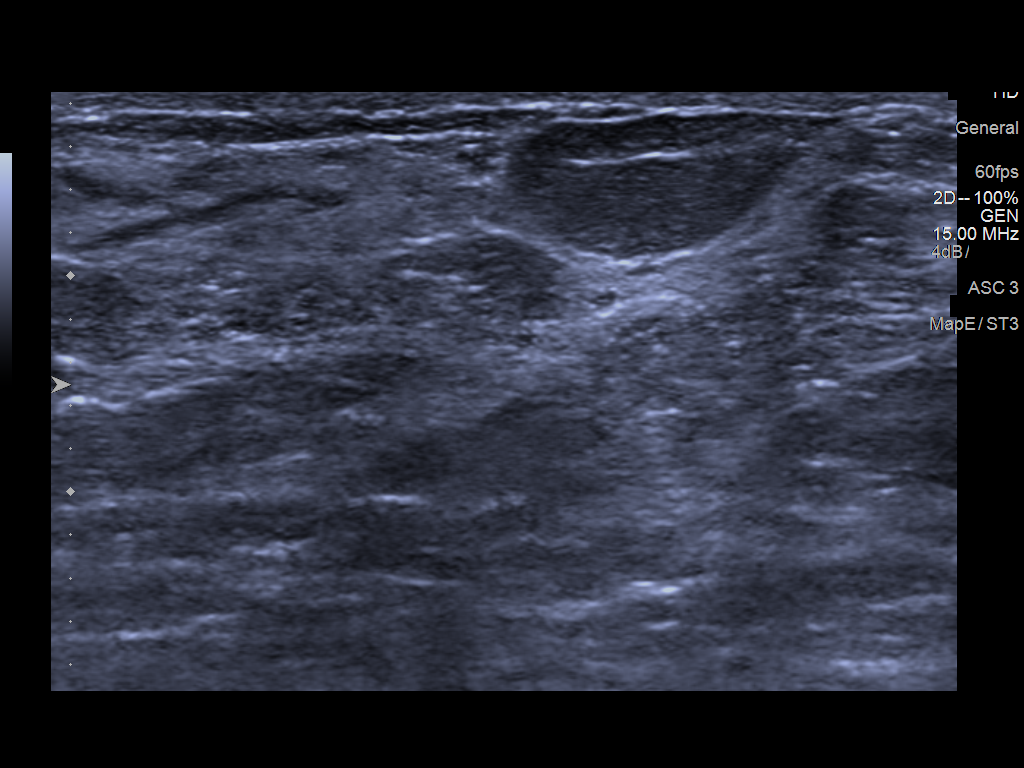

[1 of 1 positions shown; findings below may reference images not displayed]

FINDINGS: On physical exam, I palpate no discrete mass in the UPPER central
portion of the RIGHT breast at the areolar margin. RIGHT nipple
piercing appears well-healed. No erythema or edema.

Targeted ultrasound is performed, showing normal appearing
fibroglandular tissue in the 12 o'clock location of the RIGHT
breast. No suspicious mass, distortion, or acoustic shadowing is
demonstrated with ultrasound.
IMPRESSION: No ultrasound evidence for malignancy.

RECOMMENDATION:
Screening mammogram at age 38 unless there are persistent or
intervening clinical concerns. (Code:EC-P-T4E)

I have discussed the findings and recommendations with the patient.
Results were also provided in writing at the conclusion of the
visit. If applicable, a reminder letter will be sent to the patient
regarding the next appointment.

BI-RADS CATEGORY  1: Negative.

## 2020-01-20 ENCOUNTER — Other Ambulatory Visit: Payer: Self-pay | Admitting: Family Medicine

## 2020-01-20 NOTE — Telephone Encounter (Signed)
Pt requesting refill on Adderall      LOV: 11/07/2019  LRF:   12/23/2019

## 2020-02-20 ENCOUNTER — Other Ambulatory Visit: Payer: Self-pay | Admitting: Family Medicine

## 2020-02-20 MED ORDER — AMPHETAMINE-DEXTROAMPHET ER 30 MG PO CP24: 30.0000 mg | ORAL_CAPSULE | Freq: Every day | ORAL | 0 refills | Status: DC

## 2020-02-20 NOTE — Telephone Encounter (Signed)
Patient requesting a refill on her adderal she states that she is going out of town.  CB# [phone]

## 2020-02-20 NOTE — Telephone Encounter (Signed)
Ok to refill?? ° °Last office visit 31/2021. ° °Last refill 01/20/2020. °

## 2020-02-20 NOTE — Telephone Encounter (Signed)
Ok to refill??  Last office visit 31/2021.  Last refill 01/20/2020.

## 2020-03-12 DIAGNOSIS — M9902 Segmental and somatic dysfunction of thoracic region: Secondary | ICD-10-CM | POA: Diagnosis not present

## 2020-03-12 DIAGNOSIS — M546 Pain in thoracic spine: Secondary | ICD-10-CM | POA: Diagnosis not present

## 2020-03-14 IMAGING — US US ABDOMEN LIMITED
1 series · 14 of 25 positions shown · non-contrast
Comparison: None.

CLINICAL DATA: Gallstones.

EXAM:
ULTRASOUND ABDOMEN LIMITED RIGHT UPPER QUADRANT

[Series 1: us abdomen limited · 14 of 55 slices shown]
[im 1/55]
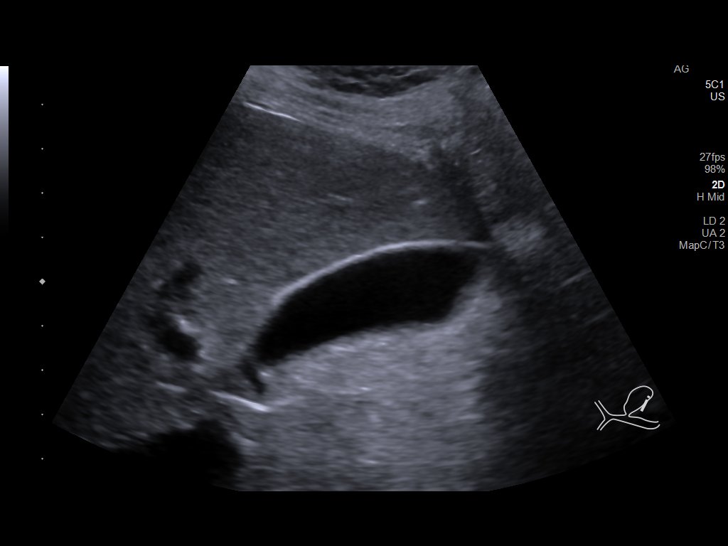
[im 5/55]
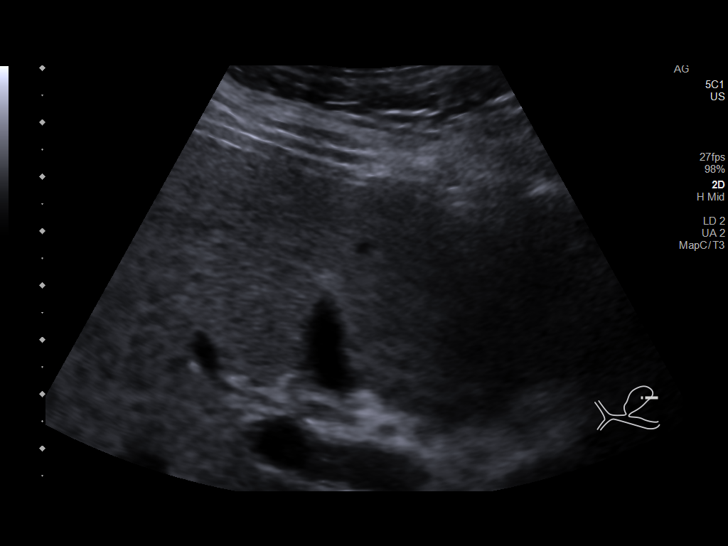
[im 10/55]
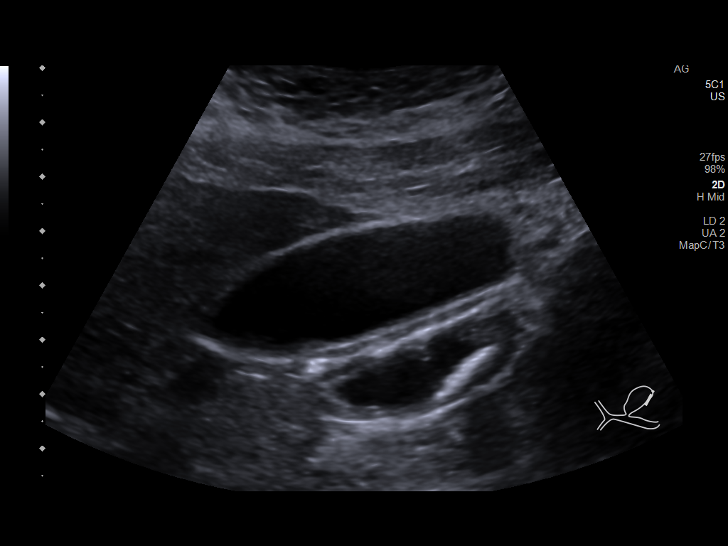
[im 14/55]
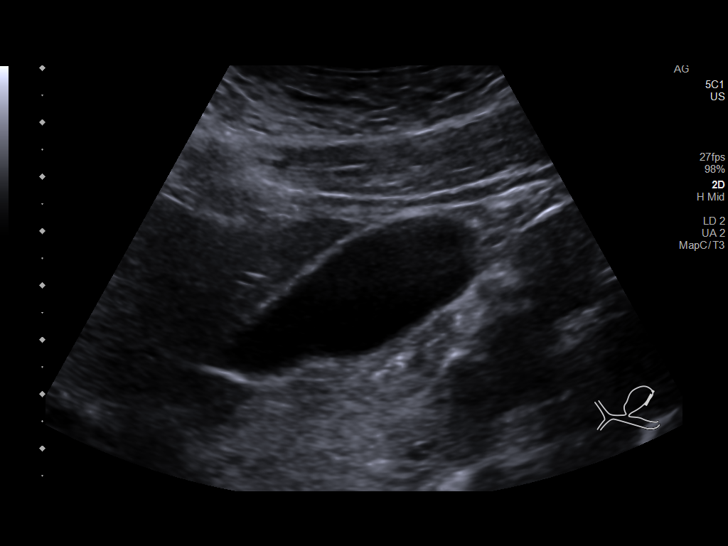
[im 19/55]
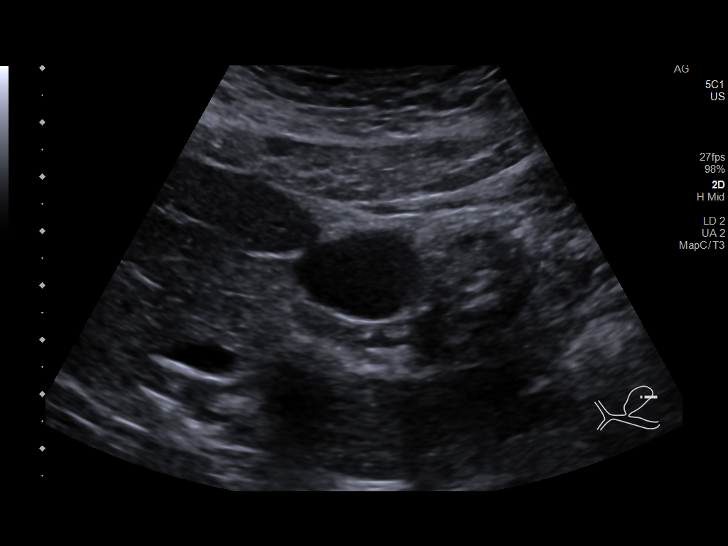
[im 21/55]
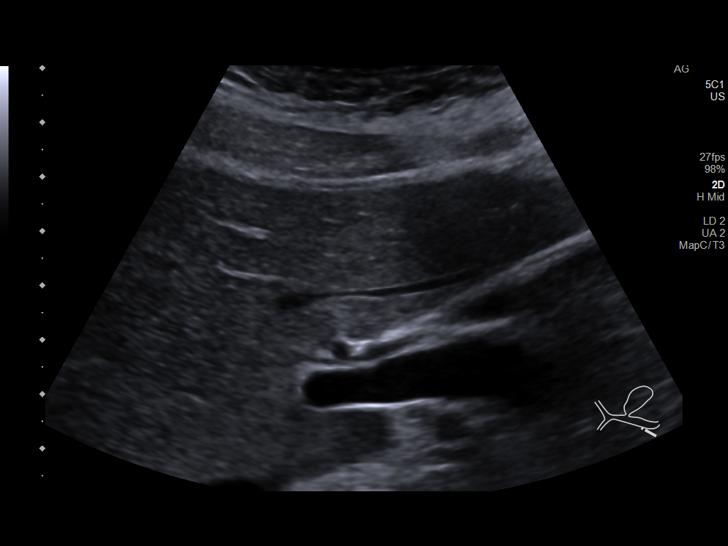
[im 25/55]
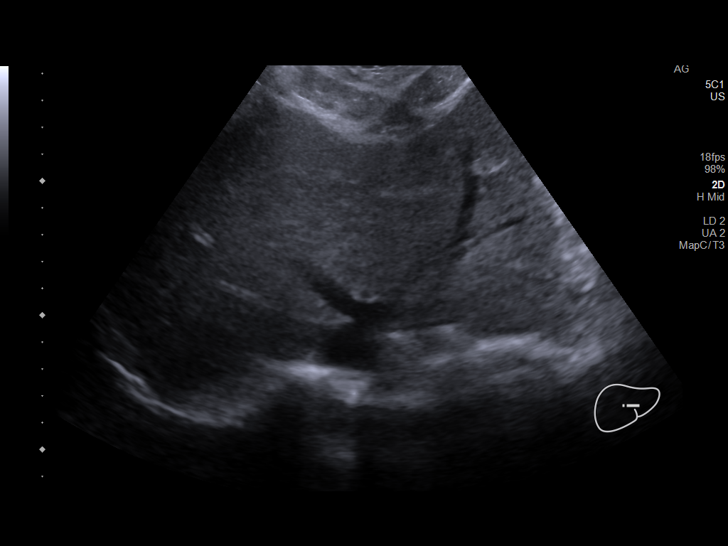
[im 30/55]
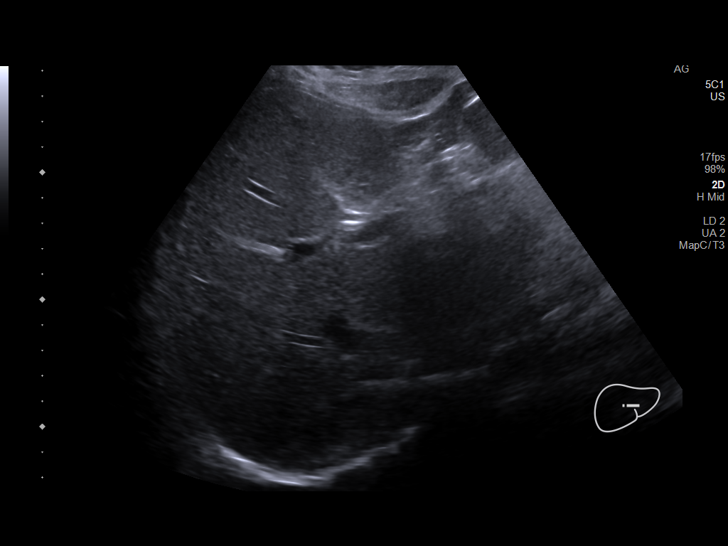
[im 34/55]
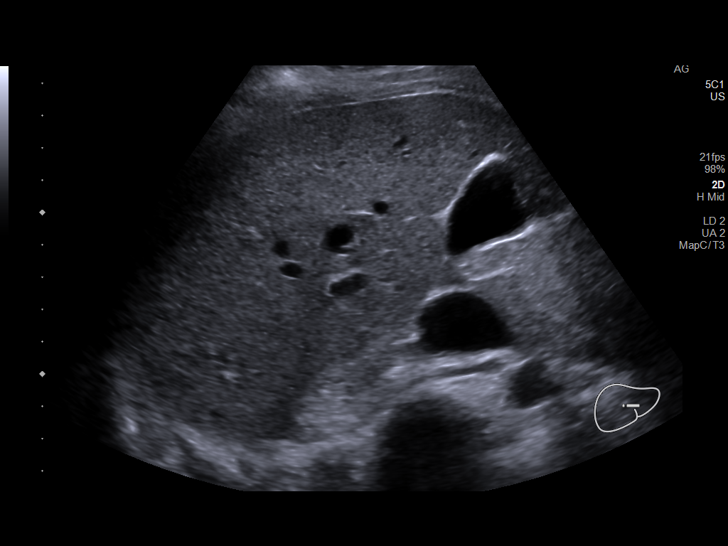
[im 37/55]
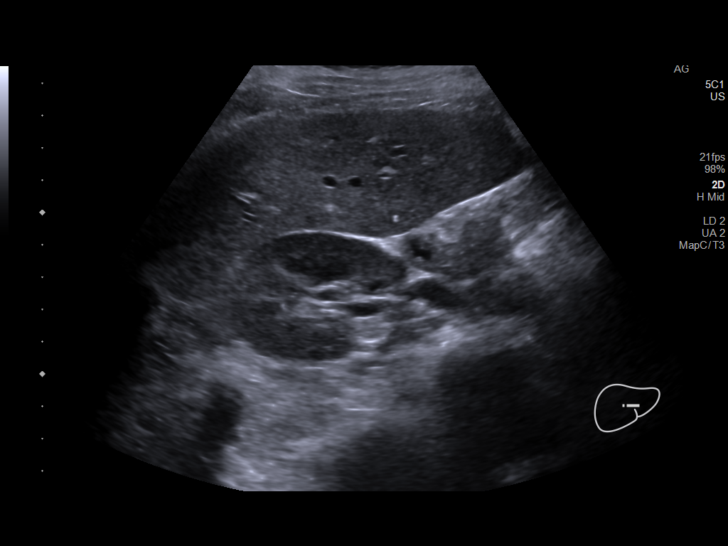
[im 41/55]
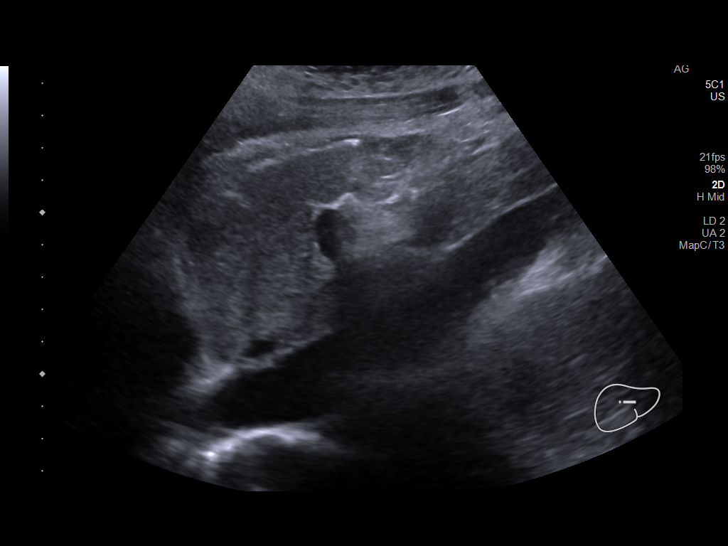
[im 46/55]
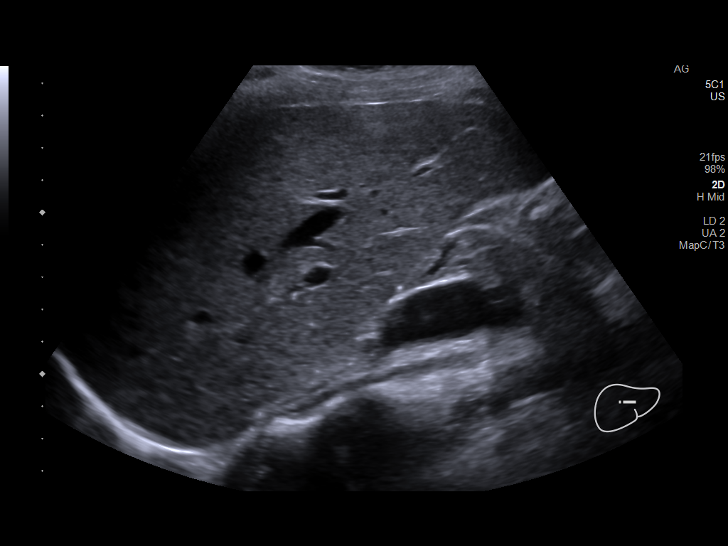
[im 50/55]
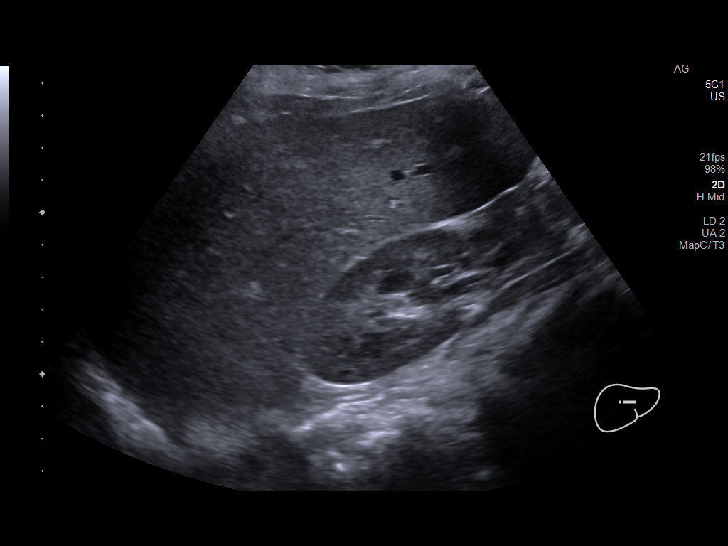
[im 55/55]
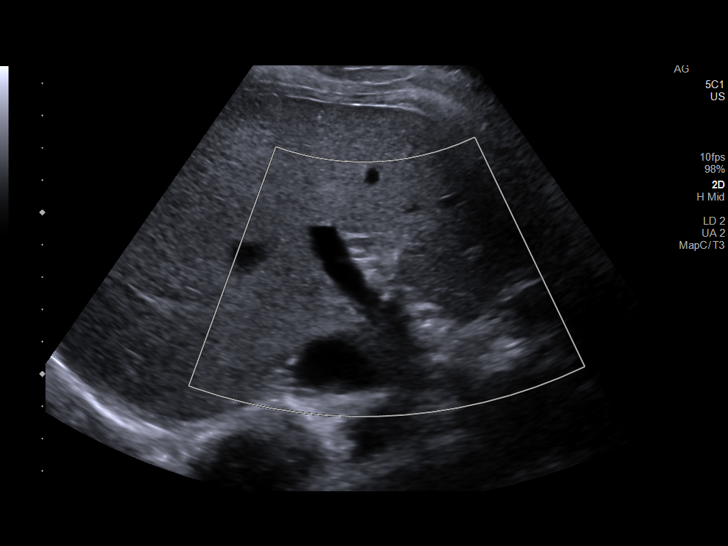

[14 of 25 positions shown; findings below may reference images not displayed]

FINDINGS: Gallbladder:

No gallstones or wall thickening visualized. No sonographic Murphy
sign noted by sonographer.

Common bile duct:

Diameter: 2.2 mm

Liver:

No focal lesion identified. Within normal limits in parenchymal
echogenicity. Portal vein is patent on color Doppler imaging with
normal direction of blood flow towards the liver.

Other: None.
IMPRESSION: Normal study.

## 2020-03-21 ENCOUNTER — Other Ambulatory Visit: Payer: Self-pay | Admitting: Family Medicine

## 2020-03-22 NOTE — Telephone Encounter (Signed)
Ok to refill??  Last office visit 11/07/2019.  Last refill 02/20/2020.

## 2020-04-16 ENCOUNTER — Other Ambulatory Visit: Payer: Self-pay | Admitting: Family Medicine

## 2020-04-16 NOTE — Telephone Encounter (Signed)
Ok to refill??  Last office visit 11/07/2019.  Last refill 03/23/2020.

## 2020-05-21 ENCOUNTER — Other Ambulatory Visit: Payer: Self-pay | Admitting: Family Medicine

## 2020-05-21 NOTE — Telephone Encounter (Signed)
-   Refill Adderall

## 2020-05-22 NOTE — Telephone Encounter (Signed)
Requested Prescriptions   Pending Prescriptions Disp Refills  . ADDERALL XR 30 MG 24 hr capsule [Pharmacy Med Name: ADDERALL XR 30 MG CAP] 30 capsule 0    Sig: TAKE ONE CAPSULE (30MG  TOTAL) BY MOUTH DAILY     Last OV 11/07/2019   Last written 04/16/2020

## 2020-05-22 NOTE — Telephone Encounter (Signed)
Refill processed by [facility].

## 2020-06-05 ENCOUNTER — Other Ambulatory Visit: Payer: BC Managed Care – PPO

## 2020-06-08 ENCOUNTER — Other Ambulatory Visit: Payer: BC Managed Care – PPO

## 2020-06-12 DIAGNOSIS — Z20822 Contact with and (suspected) exposure to covid-19: Secondary | ICD-10-CM | POA: Diagnosis not present

## 2020-06-18 ENCOUNTER — Other Ambulatory Visit: Payer: Self-pay | Admitting: Family Medicine

## 2020-06-18 NOTE — Telephone Encounter (Signed)
Ok to refill??  Last office visit 11/07/2019.  Last refill 05/22/2020.

## 2020-07-18 ENCOUNTER — Other Ambulatory Visit: Payer: Self-pay | Admitting: Family Medicine

## 2020-07-18 NOTE — Telephone Encounter (Signed)
Please advise 

## 2020-08-16 ENCOUNTER — Other Ambulatory Visit: Payer: Self-pay | Admitting: Family Medicine

## 2020-08-16 NOTE — Telephone Encounter (Signed)
Ok to refill??  Last office visit 11/07/2019.  Last refill 07/18/2020.

## 2020-09-03 ENCOUNTER — Telehealth: Payer: Self-pay | Admitting: Adult Health

## 2020-09-03 NOTE — Telephone Encounter (Signed)
Message forwarded to Cyril Mourning, NP for refill. Pt called, but no answer and no vm setup.

## 2020-09-03 NOTE — Telephone Encounter (Signed)
Pt needs refill on her BC until she can get in here for her physical, her PAP/physical is scheduled for 10/24/2019  Please advise & notify pt   [facility]

## 2020-09-04 MED ORDER — NORGESTIMATE-ETH ESTRADIOL 0.25-35 MG-MCG PO TABS: ORAL_TABLET | ORAL | 3 refills | Status: DC

## 2020-09-04 NOTE — Addendum Note (Signed)
Addended by: Cyril Mourning A on: 09/04/2020 01:07 PM   Modules accepted: Orders

## 2020-09-04 NOTE — Telephone Encounter (Signed)
Will refill OCs till appt

## 2020-09-14 ENCOUNTER — Other Ambulatory Visit: Payer: Self-pay | Admitting: Family Medicine

## 2020-09-14 MED ORDER — ADDERALL XR 30 MG PO CP24: ORAL_CAPSULE | ORAL | 0 refills | Status: DC

## 2020-09-14 NOTE — Telephone Encounter (Signed)
Ok to refill??  Last office visit 11/07/2019.  Last refill 08/17/2020.

## 2020-09-14 NOTE — Telephone Encounter (Signed)
-   Refill Adderall

## 2020-09-14 NOTE — Telephone Encounter (Signed)
Please d/c as duplicate request.

## 2020-10-11 ENCOUNTER — Other Ambulatory Visit: Payer: Self-pay | Admitting: Family Medicine

## 2020-10-23 ENCOUNTER — Other Ambulatory Visit: Payer: BC Managed Care – PPO | Admitting: Adult Health

## 2020-11-06 ENCOUNTER — Other Ambulatory Visit: Payer: Self-pay | Admitting: Family Medicine

## 2020-11-06 NOTE — Telephone Encounter (Signed)
Pt called needing a refill of  amphetamine-dextroamphetamine (ADDERALL XR) 30 MG    Cb#: [phone]

## 2020-11-06 NOTE — Telephone Encounter (Signed)
Ok to refill??  Last office visit 11/07/2019.  Last refill 10/12/2020.

## 2020-11-07 MED ORDER — AMPHETAMINE-DEXTROAMPHET ER 30 MG PO CP24: ORAL_CAPSULE | ORAL | 0 refills | Status: DC

## 2020-12-12 ENCOUNTER — Other Ambulatory Visit: Payer: Self-pay | Admitting: Adult Health

## 2021-10-07 ENCOUNTER — Ambulatory Visit (INDEPENDENT_AMBULATORY_CARE_PROVIDER_SITE_OTHER): Payer: Medicaid Other

## 2021-10-07 ENCOUNTER — Other Ambulatory Visit: Payer: Self-pay

## 2021-10-07 VITALS — BP 114/70 | HR 101 | Ht 66.0 in | Wt 162.8 lb

## 2021-10-07 DIAGNOSIS — Z3201 Encounter for pregnancy test, result positive: Secondary | ICD-10-CM

## 2021-10-07 DIAGNOSIS — Z3200 Encounter for pregnancy test, result unknown: Secondary | ICD-10-CM

## 2021-10-07 LAB — POCT URINE PREGNANCY: Preg Test, Ur: POSITIVE — AB

## 2021-10-07 NOTE — Progress Notes (Signed)
° °  NURSE VISIT- PREGNANCY CONFIRMATION   SUBJECTIVE:  Nicole [name] is a 26 y.o. G107P1001 female at [redacted]w[redacted]d by uncertain LMP of Patient's last menstrual period was 09/06/2021 (approximate). Here for pregnancy confirmation.  Home pregnancy test: positive x 2   She reports cramping.  She is not taking prenatal vitamins.    OBJECTIVE:  BP 114/70 (BP Location: Left Arm, Patient Position: Sitting, Cuff Size: Normal)    Pulse (!) 101    Ht 5\' 6"  (1.676 m)    Wt 162 lb 12.8 oz (73.8 kg)    LMP 09/06/2021 (Approximate)    BMI 26.28 kg/m   Appears well, in no apparent distress  Results for orders placed or performed in visit on 10/07/21 (from the past 24 hour(s))  POCT urine pregnancy   Collection Time: 10/07/21  2:55 PM  Result Value Ref Range   Preg Test, Ur Positive (A) Negative    ASSESSMENT: Positive pregnancy test, [redacted]w[redacted]d by LMP    PLAN: Schedule for dating ultrasound in 3-5 weeks Prenatal vitamins: plans to begin OTC ASAP   Nausea medicines: not currently needed   OB packet given: Yes  Nicole [name]  10/07/2021 2:59 PM

## 2021-10-22 ENCOUNTER — Other Ambulatory Visit: Payer: Self-pay | Admitting: Adult Health

## 2021-11-01 ENCOUNTER — Other Ambulatory Visit: Payer: Self-pay | Admitting: Obstetrics & Gynecology

## 2021-11-01 DIAGNOSIS — O3680X0 Pregnancy with inconclusive fetal viability, not applicable or unspecified: Secondary | ICD-10-CM

## 2021-11-04 ENCOUNTER — Other Ambulatory Visit: Payer: Medicaid Other

## 2021-11-04 ENCOUNTER — Other Ambulatory Visit: Payer: Self-pay

## 2021-11-04 ENCOUNTER — Ambulatory Visit (INDEPENDENT_AMBULATORY_CARE_PROVIDER_SITE_OTHER): Payer: Medicaid Other

## 2021-11-04 DIAGNOSIS — O3680X0 Pregnancy with inconclusive fetal viability, not applicable or unspecified: Secondary | ICD-10-CM | POA: Diagnosis not present

## 2021-11-04 DIAGNOSIS — Z3490 Encounter for supervision of normal pregnancy, unspecified, unspecified trimester: Secondary | ICD-10-CM

## 2021-11-04 DIAGNOSIS — Z3A08 8 weeks gestation of pregnancy: Secondary | ICD-10-CM | POA: Diagnosis not present

## 2021-11-04 NOTE — Progress Notes (Signed)
[place] 8+5 wks GS with yolk sac,GS 35.3 mm,no fetal pole visualized,normal ovaries,labs ordered and f/u ultrasound per Victorino Dike

## 2021-11-05 ENCOUNTER — Telehealth: Payer: Self-pay | Admitting: Women's Health

## 2021-11-05 LAB — BETA HCG QUANT (REF LAB): hCG Quant: 131754 m[IU]/mL

## 2021-11-05 LAB — PROGESTERONE: Progesterone: 20.9 ng/mL

## 2021-11-05 NOTE — Telephone Encounter (Signed)
Left message that [facility] is [phone] and progesterone is 20.9 which is good, keep Follow up US appt

## 2021-11-05 NOTE — Telephone Encounter (Signed)
Patient calling asking if someone will call her when the lab results come back from yesterday and why she is having to repeat the ultra sound.

## 2021-11-05 NOTE — Telephone Encounter (Signed)
Pt doesn't have mychart. Is wanting results and to discuss what plan is.

## 2021-11-12 ENCOUNTER — Other Ambulatory Visit: Payer: Self-pay | Admitting: Adult Health

## 2021-11-12 DIAGNOSIS — O3680X0 Pregnancy with inconclusive fetal viability, not applicable or unspecified: Secondary | ICD-10-CM

## 2021-11-13 ENCOUNTER — Other Ambulatory Visit: Payer: Self-pay | Admitting: Adult Health

## 2021-11-13 ENCOUNTER — Ambulatory Visit: Payer: Medicaid Other | Admitting: Obstetrics & Gynecology

## 2021-11-13 ENCOUNTER — Other Ambulatory Visit: Payer: Self-pay

## 2021-11-13 ENCOUNTER — Ambulatory Visit (INDEPENDENT_AMBULATORY_CARE_PROVIDER_SITE_OTHER): Payer: Medicaid Other

## 2021-11-13 ENCOUNTER — Encounter: Payer: Self-pay | Admitting: Obstetrics & Gynecology

## 2021-11-13 VITALS — BP 120/82 | HR 91 | Ht 67.0 in | Wt 167.0 lb

## 2021-11-13 DIAGNOSIS — O039 Complete or unspecified spontaneous abortion without complication: Secondary | ICD-10-CM

## 2021-11-13 DIAGNOSIS — O3680X0 Pregnancy with inconclusive fetal viability, not applicable or unspecified: Secondary | ICD-10-CM | POA: Diagnosis not present

## 2021-11-13 MED ORDER — MISOPROSTOL 200 MCG PO TABS: ORAL_TABLET | ORAL | 1 refills | Status: DC

## 2021-11-13 NOTE — Progress Notes (Signed)
ar ? ?GYN VISIT ?Patient name: Nicole [name] MRN [id number]  Date of birth: Oct 24, 1995 ?Chief Complaint:   ?Routine Prenatal Visit and Pregnancy Ultrasound ? ?History of Present Illness:   ?Nicole [name] is a 26 y.o. G2P1001 who presents for [place] follow up for early pregnancy.  By LMP- pt ~9wk ?She denies any pelvic or abdominal pain.  Denies vaginal bleeding.  No acute complaints or concerns ? ?Patient's last menstrual period was 09/06/2021 (approximate). ? ?Depression screen [facility] 2/9 10/24/2019 09/30/2019 07/23/2018 07/23/2018 10/21/2017  ?Decreased Interest 0 3 1 0 0  ?Down, Depressed, Hopeless 0 2 1 0 0  ?PHQ - 2 Score 0 5 2 0 0  ?Altered sleeping - 3 2 - -  ?Tired, decreased energy - 0 1 - -  ?Change in appetite - 3 1 - -  ?Feeling bad or failure about yourself  - 2 2 - -  ?Trouble concentrating - 3 2 - -  ?Moving slowly or fidgety/restless - 0 2 - -  ?Suicidal thoughts - 2 0 - -  ?PHQ-9 Score - 18 12 - -  ?Difficult doing work/chores - - Very difficult - -  ? ? ? ?Review of Systems:   ?Pertinent items are noted in HPI ?Denies fever/chills, dizziness, headaches, visual disturbances, fatigue, shortness of breath, chest pain, abdominal pain, vomiting, bowel movements, urination, or intercourse unless otherwise stated above.  ?Pertinent History Reviewed:  ?Reviewed past medical,surgical, social, obstetrical and family history.  ?Reviewed problem list, medications and allergies. ?Physical Assessment:  ? ?Vitals:  ? 11/13/21 1613  ?BP: 120/82  ?Pulse: 91  ?Weight: 167 lb (75.8 kg)  ?Height: 5\' 7"  (1.702 m)  ?Body mass index is 26.16 kg/m?. ? ?     Physical Examination:  ? General appearance: alert, well appearing, and in no distress ? Psych: flat, but appropriate for current situation ? Skin: warm & dry  ? Cardiovascular: normal heart rate noted ? Respiratory: normal respiratory effort, no distress ? Extremities: no edema  ? ?Chaperone: N/A   ?Prelim : IUP,no fetal heart tones visualized,GS 37.4 mm=9 wks,normal  ovaries ? ?Assessment & Plan:  ?1) Miscarriage ?-reviewed US findings that unfortunately confirm miscarriage ?-discussed conservative, medical and/or surgical intervention ?-discussed pros/cons of each option and reviewed that surgery may be indicated if medication fails ?-pt visible upset and wanting to leave office, she reports that she will call to schedule follow up ? ?Meds ordered this encounter  ?Medications  ? misoprostol (CYTOTEC) 200 MCG tablet  ?  Sig: Place 3 tablets in the vagina, repeat in 48 hrs if needed  ?  Dispense:  6 tablet  ?  Refill:  1  ? ? ? ? ?Return in about 1 week (around 11/20/2021) for miscarriage follow up. ? ? ?11/22/2021, DO ?Attending Obstetrician & Gynecologist, Faculty Practice ?[facility] ? ? ? ?

## 2021-11-13 NOTE — Progress Notes (Signed)
US TV:6+1 wks single IUP,no fetal heart tones visualized,GS 37.4 mm=9 wks,normal ovaries,Dr Ozan discussed results with Pt. ?

## 2021-11-14 ENCOUNTER — Ambulatory Visit: Payer: Medicaid Other | Admitting: Obstetrics & Gynecology

## 2021-11-14 ENCOUNTER — Other Ambulatory Visit: Payer: Medicaid Other

## 2021-11-19 ENCOUNTER — Ambulatory Visit: Payer: Medicaid Other | Admitting: Adult Health

## 2021-11-19 ENCOUNTER — Other Ambulatory Visit: Payer: Self-pay

## 2021-11-19 ENCOUNTER — Encounter: Payer: Self-pay | Admitting: Adult Health

## 2021-11-19 VITALS — BP 114/78 | HR 101 | Ht 67.0 in | Wt 166.0 lb

## 2021-11-19 DIAGNOSIS — O039 Complete or unspecified spontaneous abortion without complication: Secondary | ICD-10-CM

## 2021-11-19 NOTE — Progress Notes (Signed)
?  Subjective:  ?  ? Patient ID: Nicole [name], female   DOB: [birth year], 26 y.o.   MRN: [id number] ? ?HPI ?Nicole [name] is a 26 year old white female, G2P1011, back in follow up after recent miscarriage. She took cytotec 11/15/21 and had bleeding and passed tissue, and had some cramping, now spotting dark. ?Lab Results  ?Component Value Date  ? DIAGPAP  09/30/2019  ?  - Negative for Intraepithelial Lesions or Malignancy (NILM)  ? DIAGPAP - Benign reactive/reparative changes 09/30/2019  ? [place] Positive (A) 09/30/2019  ? PCP is [facility]. ? ?Review of Systems ?Spotting ?Denies any pain ?Reviewed past medical,surgical, social and family history. Reviewed medications and allergies.  ?   ?Objective:  ? Physical Exam ?BP 114/78 (BP Location: Left Arm, Patient Position: Sitting, Cuff Size: Normal)   Pulse (!) 101   Ht 5\' 7"  (1.702 m)   Wt 166 lb (75.3 kg)   LMP 09/06/2021 (Approximate)   Breastfeeding No   BMI 26.00 kg/m?   ?  Skin warm and dry. Lungs: clear to ausculation bilaterally. Cardiovascular: regular rate and rhythm.  ? [place]stream - 11/19/21 1405   ? ?  ? Pregnancy Intention Screening  ? Does the patient want to become pregnant in the next year? Unsure   ? Does the patient's partner want to become pregnant in the next year? Unsure   ? Would the patient like to discuss contraceptive options today? No   ?  ? Contraception Wrap Up  ? Current Method Female Condom   ? End Method Female Condom   ? Contraception Counseling Provided Yes   ? ?  ?  ? ?  ?  ?Assessment:  ?   ?1. Miscarriage ?-check QHCG in about 6 days ?   ?Plan:  ?   ?Follow up with me in 2 weeks  ?   ?

## 2021-11-20 ENCOUNTER — Ambulatory Visit: Payer: Medicaid Other | Admitting: Obstetrics & Gynecology

## 2021-11-20 ENCOUNTER — Telehealth: Payer: Self-pay | Admitting: Adult Health

## 2021-11-20 NOTE — Telephone Encounter (Signed)
Patient would like a call to ask a question. Please advise.  ?

## 2021-11-20 NOTE — Telephone Encounter (Signed)
Pt is passing tissue again and bleeding again. Has been doing strenuous work today. I spoke with JAG. Pt was advised to take it easy, don't do strenuous work. Repeat quant on Friday and let us know if she continues to pass tissue and bleed. Pt voiced understanding. JSY ?

## 2021-11-23 LAB — BETA HCG QUANT (REF LAB): hCG Quant: 19977 m[IU]/mL

## 2021-11-27 ENCOUNTER — Telehealth: Payer: Self-pay | Admitting: Adult Health

## 2021-11-27 DIAGNOSIS — O039 Complete or unspecified spontaneous abortion without complication: Secondary | ICD-10-CM

## 2021-11-27 NOTE — Telephone Encounter (Signed)
Pt aware that [facility] has dropped to [phone] from [phone], will recheck [facility] today,  ?

## 2021-11-28 ENCOUNTER — Telehealth: Payer: Self-pay | Admitting: Adult Health

## 2021-11-28 LAB — BETA HCG QUANT (REF LAB): hCG Quant: 6467 m[IU]/mL

## 2021-11-28 NOTE — Telephone Encounter (Signed)
Pt aware that [facility] has dropped keep appt 12/03/21 at 3:10 pm  ?

## 2021-12-03 ENCOUNTER — Other Ambulatory Visit: Payer: Self-pay

## 2021-12-03 ENCOUNTER — Encounter: Payer: Self-pay | Admitting: Adult Health

## 2021-12-03 ENCOUNTER — Ambulatory Visit: Payer: Medicaid Other | Admitting: Adult Health

## 2021-12-03 VITALS — BP 110/77 | HR 98 | Ht 67.0 in | Wt 166.0 lb

## 2021-12-03 DIAGNOSIS — O039 Complete or unspecified spontaneous abortion without complication: Secondary | ICD-10-CM | POA: Diagnosis not present

## 2021-12-03 DIAGNOSIS — Z3201 Encounter for pregnancy test, result positive: Secondary | ICD-10-CM | POA: Insufficient documentation

## 2021-12-03 DIAGNOSIS — O021 Missed abortion: Secondary | ICD-10-CM | POA: Insufficient documentation

## 2021-12-03 LAB — POCT URINE PREGNANCY: Preg Test, Ur: POSITIVE — AB

## 2021-12-03 NOTE — Progress Notes (Signed)
?  Subjective:  ?  ? Patient ID: Nicole [name], female   DOB: 05/29/1996, 26 y.o.   MRN: [id number] ? ?HPI ?Nicole [name] is a 26 year old white female, married, sp recent miscarriage, took Cytotec 11/15/21 and had bleeding and passed tissue. QHCG was 6467 6 days ago. She had sex and had some bleeding and had bleeding today, was heavy at lunch and light pink now.  ?PCP is [facility]. ? ?Lab Results  ?Component Value Date  ? DIAGPAP  09/30/2019  ?  - Negative for Intraepithelial Lesions or Malignancy (NILM)  ? DIAGPAP - Benign reactive/reparative changes 09/30/2019  ? HPVHIGH Positive (A) 09/30/2019  ?  ?Review of Systems ?Bleeding some now ?Reviewed past medical,surgical, social and family history. Reviewed medications and allergies.  ?   ?Objective:  ? Physical Exam ?BP 110/77 (BP Location: Left Arm, Patient Position: Sitting, Cuff Size: Normal)   Pulse 98   Ht 5\' 7"  (1.702 m)   Wt 166 lb (75.3 kg)   Breastfeeding No   BMI 26.00 kg/m?  blood type is A+. ?  UPT is + ?Skin warm and dry.Pelvic: external genitalia is normal in appearance no lesions, vagina: light blood without odor,urethra has no lesions or masses noted, cervix:smooth and bulbous, uterus: normal size, shape and contour, non tender, no masses felt, adnexa: no masses or tenderness noted. Bladder is mildly tender and no masses felt.urine dipstick +blood. ?Fall risk is low ? Upstream - 12/03/21 1532   ? ?  ? Pregnancy Intention Screening  ? Does the patient want to become pregnant in the next year? Unsure   ? Does the patient's partner want to become pregnant in the next year? Unsure   ? Would the patient like to discuss contraceptive options today? No   ?  ? Contraception Wrap Up  ? Current Method Withdrawal or Other Method   ? End Method Withdrawal or Other Method   ? Contraception Counseling Provided No   ? ?  ?  ? ?  ? Examination chaperoned by 12/05/21 LPN ? ?Assessment:  ?   ?1. Pregnancy examination or test, positive result ?- POCT urine pregnancy ? ?2.  Miscarriage ?Will check QHCG to watch for continued decrease  ?- Beta hCG quant (ref lab)  ?   ?Plan:  ?   ?Return in 4 weeks for pap and physical with me  ?   ?

## 2021-12-04 ENCOUNTER — Telehealth: Payer: Self-pay | Admitting: *Deleted

## 2021-12-04 LAB — BETA HCG QUANT (REF LAB): hCG Quant: 2609 m[IU]/mL

## 2021-12-04 NOTE — Telephone Encounter (Signed)
Pt was seen yesterday for follow up on miscarriage. Pt is at work today and she got the sensation that she was bleeding. She went to the bathroom and is bleeding heavy. She passed clots yesterday. I spoke with JAG. Pt's quant is dropping. This may be patient's period. Period could be heavy for 2-3 days. Pt was advised to give [place] an update tomorrow on how bleeding is. Pt voiced understanding. JSY ?

## 2021-12-09 ENCOUNTER — Telehealth: Payer: Self-pay | Admitting: Adult Health

## 2021-12-09 NOTE — Telephone Encounter (Signed)
Pt has had a recent miscarriage. She started bleeding after taking the Cytotec. Bleeding stopped 3 days after taking Cytotec and it started again and has been everyday. Last week when she called, you thought it may be her period. Some days are heavy and some days are spotting. Pt is concerned. Please advise. Thanks! JSY ?

## 2021-12-09 NOTE — Telephone Encounter (Signed)
Patient is wanting to ask some questions about bleeding and how long is normal to bleed after miscarriage. Please advise.  ?

## 2021-12-10 NOTE — Telephone Encounter (Signed)
Bleeding still, may be 1 big drop then just when wipes, this is not unusual, will watch for now. Has appt 12/31/21  ?

## 2021-12-11 ENCOUNTER — Telehealth: Payer: Self-pay

## 2021-12-11 ENCOUNTER — Encounter: Payer: Self-pay | Admitting: Advanced Practice Midwife

## 2021-12-11 ENCOUNTER — Ambulatory Visit: Payer: Medicaid Other | Admitting: Advanced Practice Midwife

## 2021-12-11 VITALS — BP 113/76 | HR 89 | Ht 67.0 in | Wt 164.0 lb

## 2021-12-11 DIAGNOSIS — O021 Missed abortion: Secondary | ICD-10-CM

## 2021-12-11 NOTE — Telephone Encounter (Signed)
PT CALLED AND STATED THAT SHE SPOKE WITH JENNIFER BUT, SHE FEELS THAT SHE IS GETTING WORSE.  SHE WOULD LIKE FOR A NURSE TO GIVE HER A CALL TO ADVISE HER WETHER OR NOT SHE NEEDS TO MAKE AN APPOINTMENT. ?

## 2021-12-11 NOTE — Progress Notes (Signed)
? ?  GYN VISIT ?Patient name: Nicole [name] MRN [id number]  Date of birth: Dec 18, 1995 ?Chief Complaint:   ?Dysmenorrhea ? ?History of Present Illness:   ?Nicole [name] is a 26 y.o. G65P1011 Caucasian female being seen today for increased bleeding during AB process after using cytotec for missed AB. Dx with MAB on 3/8 and took cytotec on 3/10 with intermittent bleeding. Last night she had heavier cramping with passage of a small black tissue/clot and some bleeding. Feels kind of weak but not dizzy. Bleeding less today. ? ?No LMP recorded (lmp unknown). ?The current method of family planning is none.  ?Last pap Jan 2021. Results were: NILM w/ HRHPV positive: other (not 16, 18/45) ? ? ?  10/24/2019  ?  3:50 PM 09/30/2019  ? 11:36 AM 07/23/2018  ?  2:15 PM 07/23/2018  ?  2:11 PM 10/21/2017  ?  3:47 PM  ?Depression screen PHQ 2/9  ?Decreased Interest 0 3 1 0 0  ?Down, Depressed, Hopeless 0 2 1 0 0  ?PHQ - 2 Score 0 5 2 0 0  ?Altered sleeping  3 2    ?Tired, decreased energy  0 1    ?Change in appetite  3 1    ?Feeling bad or failure about yourself   2 2    ?Trouble concentrating  3 2    ?Moving slowly or fidgety/restless  0 2    ?Suicidal thoughts  2 0    ?PHQ-9 Score  18 12    ?Difficult doing work/chores   Very difficult    ? ?  ?   ? View : No data to display.  ?  ?  ?  ? ? ? ?Review of Systems:   ?Pertinent items are noted in HPI ?Denies fever/chills, dizziness, headaches, visual disturbances, fatigue, shortness of breath, chest pain, abdominal pain, vomiting, abnormal vaginal discharge/itching/odor/irritation, problems with periods, bowel movements, urination, or intercourse unless otherwise stated above.  ?Pertinent History Reviewed:  ?Reviewed past medical,surgical, social, obstetrical and family history.  ?Reviewed problem list, medications and allergies. ?Physical Assessment:  ? ?Vitals:  ? 12/11/21 1355  ?BP: 113/76  ?Pulse: 89  ?Weight: 164 lb (74.4 kg)  ?Height: 5\' 7"  (1.702 m)  ?Body mass index is 25.69  kg/m?. ? ?     Physical Examination:  ? General appearance: alert, well appearing, and in no distress ? Mental status: alert, oriented to person, place, and time ? Skin: warm & dry  ? Cardiovascular: normal heart rate noted ? Respiratory: normal respiratory effort, no distress ? Abdomen: soft, non-tender  ? Pelvic: normal external genitalia, vulva, vagina, cervix, uterus and adnexa; small amt of bleeding at os; no clots in vagina ? Extremities: no edema  ? ? ?No results found for this or any previous visit (from the past 24 hour(s)).  ?Assessment & Plan:  ?1) Process of medically managed missed AB vs first cycle, will get quant and CBC today; is hemodynamically stable with minimal bleeding now (JG to f/u tomorrow) ? ? ?Meds: No orders of the defined types were placed in this encounter. ? ? ?Orders Placed This Encounter  ?Procedures  ? Beta hCG quant (ref lab)  ? CBC  ? ? ?No follow-ups on file. (Has visit with JG 4/25 for pap/physical) ? ?5/25 CNM ?12/11/2021 ?2:20 PM  ?

## 2021-12-12 ENCOUNTER — Telehealth: Payer: Self-pay | Admitting: Adult Health

## 2021-12-12 LAB — CBC
Hematocrit: 37.7 % (ref 34.0–46.6)
Hemoglobin: 12.9 g/dL (ref 11.1–15.9)
MCH: 32.2 pg (ref 26.6–33.0)
MCHC: 34.2 g/dL (ref 31.5–35.7)
MCV: 94 fL (ref 79–97)
Platelets: 165 10*3/uL (ref 150–450)
RBC: 4.01 x10E6/uL (ref 3.77–5.28)
RDW: 12 % (ref 11.7–15.4)
WBC: 6.6 10*3/uL (ref 3.4–10.8)

## 2021-12-12 LAB — BETA HCG QUANT (REF LAB): hCG Quant: 708 m[IU]/mL

## 2021-12-12 NOTE — Telephone Encounter (Signed)
Pt aware QHCG has dropped to 708 and hemoglobin is good. Still bleeding some. Has appt 12/31/21. Will discuss with Dr Despina Hidden.  ?

## 2021-12-12 NOTE — Telephone Encounter (Signed)
Pt aware I spoke with Dr Despina Hidden, this is probably period, keep 12/31/21 appt.  ?

## 2021-12-25 ENCOUNTER — Telehealth: Payer: Self-pay | Admitting: Advanced Practice Midwife

## 2021-12-25 ENCOUNTER — Other Ambulatory Visit: Payer: Self-pay | Admitting: Obstetrics & Gynecology

## 2021-12-25 ENCOUNTER — Telehealth: Payer: Self-pay

## 2021-12-25 DIAGNOSIS — N939 Abnormal uterine and vaginal bleeding, unspecified: Secondary | ICD-10-CM

## 2021-12-25 DIAGNOSIS — O039 Complete or unspecified spontaneous abortion without complication: Secondary | ICD-10-CM

## 2021-12-25 NOTE — Telephone Encounter (Signed)
Returned pt's call, two identifiers used. Pt stated that she is having bleeding the consistency of water most days. She has pain with and after sex. She has a full feeling in her lower abdomen and feels "as if I'm going to burst." For approx 2 hours each day, she bleeds more period-like darker blood after cramping. The cramps are intermittent and there is usually bleeding afterwards. Dr Charlotta Newton consulted and recommended an [place] to rule out retained tissue. Attempting to schedule at this time for first available. ?

## 2021-12-25 NOTE — Telephone Encounter (Signed)
Patient states she is still having severe cramping with bleeding. She states that it is like that for the first 2 hours of the day and then it tapers off. She has an appointment with Roseanne Reno next week for a pap/physical. Please advise.  ?

## 2021-12-25 NOTE — Progress Notes (Signed)
See phone encounter- s/p miscarriage and treatment with cytotec continues to note irregular bleeding.  Plan for pelvic US and visit to follow ? ?Myna Hidalgo, DO ?Attending Obstetrician & Gynecologist, Faculty Practice ?Center for [employer], [facility] ? ? ?

## 2021-12-25 NOTE — Telephone Encounter (Signed)
Pt called stating that her cramping had become much worse and she passed a large (size of her palm) gray piece of tissue and was now having heavy bleeding. She denies filling a pad in an hour's time or less. She took three Tylenol with no pain relief. Dr Charlotta Newton was made aware and pt was told to monitor her bleeding and go to [facility] hospital if it became severe or if she had dizziness, N/V, etc. Pt told to take 600 mg of Advil q6 hours and use a heating pad. Pt will keep [place] appt for 4/20 as scheduled. Pt confirmed understanding. ?

## 2021-12-26 ENCOUNTER — Encounter (HOSPITAL_COMMUNITY): Payer: Self-pay | Admitting: Obstetrics and Gynecology

## 2021-12-26 ENCOUNTER — Ambulatory Visit (INDEPENDENT_AMBULATORY_CARE_PROVIDER_SITE_OTHER): Payer: Medicaid Other

## 2021-12-26 ENCOUNTER — Other Ambulatory Visit: Payer: Medicaid Other

## 2021-12-26 ENCOUNTER — Other Ambulatory Visit: Payer: Self-pay | Admitting: Obstetrics & Gynecology

## 2021-12-26 DIAGNOSIS — N939 Abnormal uterine and vaginal bleeding, unspecified: Secondary | ICD-10-CM

## 2021-12-26 DIAGNOSIS — O039 Complete or unspecified spontaneous abortion without complication: Secondary | ICD-10-CM

## 2021-12-26 DIAGNOSIS — Z3A15 15 weeks gestation of pregnancy: Secondary | ICD-10-CM | POA: Diagnosis not present

## 2021-12-26 DIAGNOSIS — R102 Pelvic and perineal pain unspecified side: Secondary | ICD-10-CM

## 2021-12-26 NOTE — Progress Notes (Signed)
Pre-op phone instructions completed with pt. Verbalized understanding, NPO post midnight. Pt will arrive tom at 1130. ?

## 2021-12-26 NOTE — Progress Notes (Signed)
US TV: homogeneous anteverted uterus,gestational sac 24.8 mm,CRL 2.47 mm=5+5 wks,no FHT,normal ovaries,Dr.Ozan reviewed images during ultrasound and discussed with patient  ? ?Chaperone Estill Bamberg  ?

## 2021-12-26 NOTE — H&P (Signed)
Faculty Practice Obstetrics and Gynecology Attending History and Physical ? ?Nicole [name] is a 26 y.o. G2P1011 who presents for surgical management of miscarriage.  Initially [place] was completed on 2/27, which was suspicious for an early loss and follow up US on 3/8 confirmed pregnancy loss.  She was managed with cytotec and noted heavy bleeding, passage of tissue and cramping.  While it was initially thought that treatment was successful, she continue to note moderate to heavy bleeding, bloating, pelvic pain and passage of tissue.  ? ?A follow up US was completed on yesterday 4/19, which showed a gestational sac ~ [redacted]w[redacted]d.   ? ?At this time, she is here for surgical intervention due to failed medical management. ? ?Denies fevers, chills, sweats, dysuria, nausea or vomiting. ? ?Past Medical History:  ?Diagnosis Date  ? ADD (attention deficit disorder)   ? BV (bacterial vaginosis) 03/18/2013  ? Treat ed with flagyl  ? Contraceptive management 05/29/2015  ? Depression   ? Encounter for insertion of mirena IUD 07/11/2016  ? 07/11/16   ? Encounter for management of intrauterine contraceptive device (IUD) 08/20/2016  ? Encounter for surveillance of contraceptive pills 08/30/2018  ? History of prior pregnancy with IUGR newborn 06/09/2016  ? Irregular bleeding 08/08/2016  ? Miscarriage   ? Supervision of normal pregnancy in first trimester 10/05/2015  ?  [facility]  Initiated Care at   9+6 weeks  FOB  Dorian Heckle 26 yo WM  Dating By  LMP and [place]  Pap  10/05/15 negative +yeast  GC/CT Initial:     -/-           36+wks:  Genetic Screen AFP  CF screen   1 variant detected ; FOB declines testing  Anatomic [place]   Flu vaccine  10/05/15  Tdap Recommended ~ 28wks  Glucose Screen  2 hr  GBS   Feed Preference   Contraception   Circumcision   Childbirth Classes   Pediatrician      ? Vertigo   ? ?Past Surgical History:  ?Procedure Laterality Date  ? COLONOSCOPY    ? ?OB History  ?Gravida Para Term Preterm AB Living  ?2 1 1  0 1 1   ?SAB IAB Ectopic Multiple Live Births  ?1 0 0 0 1  ?  ?# Outcome Date GA Lbr Len/2nd Weight Sex Delivery Anes PTL Lv  ?2 SAB 2023          ?1 Term 04/29/16 [redacted]w[redacted]d 14:05 / 01:25 3100 g F Vag-Spont EPI  LIV  ?   Birth Comments: None noted  ?Patient denies any other pertinent gynecologic issues.  ?Current Facility-Administered Medications on File Prior to Encounter  ?Medication Dose Route Frequency Provider Last Rate Last Admin  ? 0.9 %  sodium chloride infusion  500 mL Intravenous Once Armbruster, [redacted]w[redacted]d, MD      ? ?Current Outpatient Medications on File Prior to Encounter  ?Medication Sig Dispense Refill  ? amphetamine-dextroamphetamine (ADDERALL XR) 30 MG 24 hr capsule TAKE ONE CAPSULE (30MG  TOTAL) BY MOUTH DAILY 30 capsule 0  ? ?No Known Allergies ? ?Social History:   reports that she has been smoking cigarettes. She has a 0.10 pack-year smoking history. She has never used smokeless tobacco. She reports that she does not currently use alcohol. She reports that she does not use drugs. ?Family History  ?Problem Relation Age of Onset  ? Glaucoma Maternal Grandmother   ? Hypertension Maternal Grandmother   ? Anxiety disorder Maternal Grandmother   ?  Deafness Mother   ? Arthritis Mother   ? Hyperlipidemia Mother   ? Dementia Paternal Grandmother   ? COPD Maternal Grandfather   ? GER disease Maternal Grandfather   ? Heart attack Father   ? Deafness Father   ? Colon cancer Neg Hx   ? Pancreatic cancer Neg Hx   ? Stomach cancer Neg Hx   ? Esophageal cancer Neg Hx   ? Liver disease Neg Hx   ? ? ?Review of Systems: Pertinent items noted in HPI and remainder of comprehensive ROS otherwise negative. ? ?PHYSICAL EXAM: ?Last menstrual period 09/06/2021. ?CONSTITUTIONAL: Well-developed, well-nourished female in no acute distress.  ?SKIN: Skin is warm and dry. No rash noted. Not diaphoretic. No erythema. No pallor. ?NEUROLOGIC: Alert and oriented to person, place, and time. Normal reflexes, muscle tone coordination. No cranial  nerve deficit noted. ?PSYCHIATRIC: Normal mood and affect. Normal behavior. Normal judgment and thought content. ?CARDIOVASCULAR: Normal heart rate noted, regular rhythm ?RESPIRATORY: Effort and breath sounds normal, no problems with respiration noted ?ABDOMEN: Soft, mild lower abdominal tenderness, no rebound, no guarding ?PELVIC: deferred ?MUSCULOSKELETAL: no calf tenderness bilaterally ?EXT: no edema bilaterally, normal pulses ? ?Labs: ? ?  Latest Ref Rng & Units 12/11/2021  ?  2:33 PM 08/31/2019  ?  9:04 AM 07/26/2019  ?  8:38 AM  ?CBC  ?WBC 3.4 - 10.8 x10E3/uL 6.6   6.8   14.5    ?Hemoglobin 11.1 - 15.9 g/dL 48.8   89.1   69.4    ?Hematocrit 34.0 - 46.6 % 37.7   42.0   39.3    ?Platelets 150 - 450 x10E3/uL 165   164.0   182    ? ? ? ?Imaging Studies: ?Last [place] 4/20: homogeneous anteverted uterus,gestational sac 24.8 mm,CRL 2.47 mm=5+5 wks,no FHT,normal ovaries ?Assessment: ?Miscarriage ? ? ?Plan: ?Suction Dilation and evacuation ?-NPO ?-LR @ 125cc/hr ?-SCDs to OR ?-IV Doxycyline ?-Risk/benefits and alternatives reviewed with the patient including but not limited to risk of bleeding, infection and injury to surrounding organs.  Questions and concerns were addressed and pt desires to proceed ? ?Myna Hidalgo, DO ?Attending Obstetrician & Gynecologist, Faculty Practice ?Center for [employer], [facility] ?  ?

## 2021-12-27 ENCOUNTER — Ambulatory Visit (HOSPITAL_BASED_OUTPATIENT_CLINIC_OR_DEPARTMENT_OTHER): Payer: Medicaid Other | Admitting: Certified Registered"

## 2021-12-27 ENCOUNTER — Encounter (HOSPITAL_COMMUNITY)
Admission: RE | Disposition: A | Discharge status: Home / Self Care | Payer: Self-pay | Source: Home / Self Care | Attending: Obstetrics and Gynecology

## 2021-12-27 ENCOUNTER — Other Ambulatory Visit: Payer: Self-pay

## 2021-12-27 ENCOUNTER — Ambulatory Visit (HOSPITAL_COMMUNITY)
Admission: RE | Admit: 2021-12-27 | Discharge: 2021-12-27 | Disposition: A | Discharge status: Home / Self Care | Payer: Medicaid Other | Attending: Obstetrics and Gynecology | Admitting: Obstetrics and Gynecology

## 2021-12-27 ENCOUNTER — Ambulatory Visit (HOSPITAL_COMMUNITY): Payer: Medicaid Other | Admitting: Certified Registered"

## 2021-12-27 ENCOUNTER — Encounter (HOSPITAL_COMMUNITY): Payer: Self-pay | Admitting: Obstetrics and Gynecology

## 2021-12-27 DIAGNOSIS — O021 Missed abortion: Secondary | ICD-10-CM | POA: Diagnosis not present

## 2021-12-27 DIAGNOSIS — O99331 Smoking (tobacco) complicating pregnancy, first trimester: Secondary | ICD-10-CM | POA: Diagnosis not present

## 2021-12-27 DIAGNOSIS — Z3A01 Less than 8 weeks gestation of pregnancy: Secondary | ICD-10-CM | POA: Diagnosis not present

## 2021-12-27 DIAGNOSIS — F17210 Nicotine dependence, cigarettes, uncomplicated: Secondary | ICD-10-CM | POA: Insufficient documentation

## 2021-12-27 DIAGNOSIS — O034 Incomplete spontaneous abortion without complication: Secondary | ICD-10-CM | POA: Diagnosis present

## 2021-12-27 HISTORY — PX: DILATION AND EVACUATION: SHX1459

## 2021-12-27 LAB — BETA HCG QUANT (REF LAB): hCG Quant: 104 m[IU]/mL

## 2021-12-27 SURGERY — DILATION AND EVACUATION, UTERUS
Anesthesia: General

## 2021-12-27 MED ORDER — ACETAMINOPHEN 500 MG PO TABS
ORAL_TABLET | ORAL | Status: AC
  Filled 2021-12-27: qty 2

## 2021-12-27 MED ORDER — LIDOCAINE 2% (20 MG/ML) 5 ML SYRINGE
INTRAMUSCULAR | Status: DC | PRN
Start: 2021-12-27 — End: 2021-12-27
  Administered 2021-12-27: 100 mg via INTRAVENOUS

## 2021-12-27 MED ORDER — ORAL CARE MOUTH RINSE: 15.0000 mL | Freq: Once | OROMUCOSAL | Status: AC

## 2021-12-27 MED ORDER — PROPOFOL 10 MG/ML IV BOLUS
INTRAVENOUS | Status: AC
  Filled 2021-12-27: qty 20

## 2021-12-27 MED ORDER — LACTATED RINGERS IV SOLN: INTRAVENOUS | Status: DC

## 2021-12-27 MED ORDER — IBUPROFEN 600 MG PO TABS: 600.0000 mg | ORAL_TABLET | Freq: Four times a day (QID) | ORAL | 1 refills | Status: DC | PRN

## 2021-12-27 MED ORDER — AMISULPRIDE (ANTIEMETIC) 5 MG/2ML IV SOLN
10.0000 mg | Freq: Once | INTRAVENOUS | Status: AC
  Administered 2021-12-27: 10 mg via INTRAVENOUS

## 2021-12-27 MED ORDER — DOXYCYCLINE HYCLATE 100 MG IV SOLR
200.0000 mg | Freq: Once | INTRAVENOUS | Status: AC
  Administered 2021-12-27: 200 mg via INTRAVENOUS
  Filled 2021-12-27: qty 200

## 2021-12-27 MED ORDER — METHYLERGONOVINE MALEATE 0.2 MG/ML IJ SOLN
INTRAMUSCULAR | Status: DC | PRN
  Administered 2021-12-27: .2 mg via INTRAMUSCULAR

## 2021-12-27 MED ORDER — MIDAZOLAM HCL 2 MG/2ML IJ SOLN
INTRAMUSCULAR | Status: AC
  Filled 2021-12-27: qty 2

## 2021-12-27 MED ORDER — CHLORHEXIDINE GLUCONATE 0.12 % MT SOLN: 15.0000 mL | Freq: Once | OROMUCOSAL | Status: AC

## 2021-12-27 MED ORDER — MIDAZOLAM HCL 2 MG/2ML IJ SOLN
INTRAMUSCULAR | Status: DC | PRN
  Administered 2021-12-27: 2 mg via INTRAVENOUS

## 2021-12-27 MED ORDER — CHLORHEXIDINE GLUCONATE 0.12 % MT SOLN
OROMUCOSAL | Status: AC
  Administered 2021-12-27: 15 mL via OROMUCOSAL
  Filled 2021-12-27: qty 15

## 2021-12-27 MED ORDER — AMISULPRIDE (ANTIEMETIC) 5 MG/2ML IV SOLN
INTRAVENOUS | Status: AC
  Filled 2021-12-27: qty 4

## 2021-12-27 MED ORDER — ACETAMINOPHEN 500 MG PO TABS
1000.0000 mg | ORAL_TABLET | Freq: Once | ORAL | Status: AC
  Administered 2021-12-27: 1000 mg via ORAL

## 2021-12-27 MED ORDER — PROPOFOL 500 MG/50ML IV EMUL
INTRAVENOUS | Status: DC | PRN
  Administered 2021-12-27: 100 ug/kg/min via INTRAVENOUS

## 2021-12-27 MED ORDER — POVIDONE-IODINE 10 % EX SWAB
2.0000 "application " | Freq: Once | CUTANEOUS | Status: AC
  Administered 2021-12-27: 2 via TOPICAL

## 2021-12-27 MED ORDER — FENTANYL CITRATE (PF) 250 MCG/5ML IJ SOLN
INTRAMUSCULAR | Status: DC | PRN
  Administered 2021-12-27 (×3): 50 ug via INTRAVENOUS
  Administered 2021-12-27: 100 ug via INTRAVENOUS

## 2021-12-27 MED ORDER — METHYLERGONOVINE MALEATE 0.2 MG/ML IJ SOLN
INTRAMUSCULAR | Status: AC
  Filled 2021-12-27: qty 1

## 2021-12-27 MED ORDER — FENTANYL CITRATE (PF) 100 MCG/2ML IJ SOLN: 25.0000 ug | INTRAMUSCULAR | Status: DC | PRN

## 2021-12-27 MED ORDER — FENTANYL CITRATE (PF) 250 MCG/5ML IJ SOLN
INTRAMUSCULAR | Status: AC
  Filled 2021-12-27: qty 5

## 2021-12-27 MED ORDER — PROPOFOL 10 MG/ML IV BOLUS
INTRAVENOUS | Status: DC | PRN
  Administered 2021-12-27: 30 mg via INTRAVENOUS
  Administered 2021-12-27: 100 mg via INTRAVENOUS

## 2021-12-27 SURGICAL SUPPLY — 19 items
CATH ROBINSON RED A/P 16FR (CATHETERS) ×2 IMPLANT
DECANTER SPIKE VIAL GLASS SM (MISCELLANEOUS) ×2 IMPLANT
FILTER UTR ASPR ASSEMBLY (MISCELLANEOUS) ×2 IMPLANT
GLOVE SURG ORTHO 8.0 STRL STRW (GLOVE) ×2 IMPLANT
GOWN STRL REUS W/ TWL LRG LVL3 (GOWN DISPOSABLE) ×1 IMPLANT
GOWN STRL REUS W/ TWL XL LVL3 (GOWN DISPOSABLE) ×1 IMPLANT
GOWN STRL REUS W/TWL LRG LVL3 (GOWN DISPOSABLE) ×2
GOWN STRL REUS W/TWL XL LVL3 (GOWN DISPOSABLE) ×2
HOSE CONNECTING 18IN BERKELEY (TUBING) ×2 IMPLANT
KIT BERKELEY 1ST TRI 3/8 NO TR (MISCELLANEOUS) ×2 IMPLANT
KIT BERKELEY 1ST TRIMESTER 3/8 (MISCELLANEOUS) ×2 IMPLANT
NS IRRIG 1000ML POUR BTL (IV SOLUTION) ×2 IMPLANT
PACK VAGINAL MINOR WOMEN LF (CUSTOM PROCEDURE TRAY) ×2 IMPLANT
PAD ABD 8X10 STRL (GAUZE/BANDAGES/DRESSINGS) ×1 IMPLANT
PAD OB MATERNITY 4.3X12.25 (PERSONAL CARE ITEMS) ×2 IMPLANT
SET BERKELEY SUCTION TUBING (SUCTIONS) ×2 IMPLANT
TOWEL GREEN STERILE FF (TOWEL DISPOSABLE) ×4 IMPLANT
UNDERPAD 30X36 HEAVY ABSORB (UNDERPADS AND DIAPERS) ×2 IMPLANT
VACURETTE 8 RIGID CVD (CANNULA) ×1 IMPLANT

## 2021-12-27 NOTE — Interval H&P Note (Signed)
History and Physical Interval Note: ? ?12/27/2021 ?1:18 PM ? ?Nicole [name]  has presented today for surgery, with the diagnosis of Miscarriage.  The various methods of treatment have been discussed with the patient and family. After consideration of risks, benefits and other options for treatment, the patient has consented to  Procedure(s): ?SUCTION DILATATION AND EVACUATION (N/A) as a surgical intervention.  The patient's history has been reviewed, patient examined, no change in status, stable for surgery.  I have reviewed the patient's chart and labs.  Questions were answered to the patient's satisfaction.   ? ? ?Warden Fillers ? ? ?

## 2021-12-27 NOTE — Transfer of Care (Signed)
Immediate Anesthesia Transfer of Care Note ? ?Patient: Nicole [name] ? ?Procedure(s) Performed: SUCTION DILATATION AND EVACUATION ? ?Patient Location: PACU ? ?Anesthesia Type:MAC ? ?Level of Consciousness: drowsy and patient cooperative ? ?Airway & Oxygen Therapy: Patient Spontanous Breathing ? ?Post-op Assessment: Report given to RN and Post -op Vital signs reviewed and stable ? ?Post vital signs: Reviewed and stable ? ?Last Vitals:  ?Vitals Value Taken Time  ?BP    ?Temp    ?Pulse    ?Resp    ?SpO2    ? ? ?Last Pain:  ?Vitals:  ? 12/27/21 1207  ?TempSrc:   ?PainSc: 4   ?   ? ?  ? ?Complications: No notable events documented. ?

## 2021-12-27 NOTE — Anesthesia Preprocedure Evaluation (Addendum)
Anesthesia Evaluation  ?Patient identified by MRN, date of birth, ID band ?Patient awake ? ? ? ?Reviewed: ?Allergy & Precautions, NPO status , Patient's Chart, lab work & pertinent test results ? ?Airway ?Mallampati: II ? ?TM Distance: >3 FB ?Neck ROM: Full ? ? ? Dental ? ?(+) Teeth Intact ?  ?Pulmonary ?neg pulmonary ROS, Current Smoker and Patient abstained from smoking.,  ?  ?Pulmonary exam normal ? ? ? ? ? ? ? Cardiovascular ?negative cardio ROS ?Normal cardiovascular exam ? ? ?  ?Neuro/Psych ?negative neurological ROS ? negative psych ROS  ? GI/Hepatic ?negative GI ROS, Neg liver ROS,   ?Endo/Other  ?negative endocrine ROS ? Renal/GU ?negative Renal ROS  ?negative genitourinary ?  ?Musculoskeletal ?negative musculoskeletal ROS ?(+)  ? Abdominal ?  ?Peds ? ?(+) ATTENTION DEFICIT DISORDER WITHOUT HYPERACTIVITY Hematology ?negative hematology ROS ?(+)   ?Anesthesia Other Findings ? ? Reproductive/Obstetrics ? ?  ? ? ? ? ? ? ? ? ? ? ? ? ? ?  ?  ? ? ? ? ? ? ? ?Anesthesia Physical ?Anesthesia Plan ? ?ASA: 2 ? ?Anesthesia Plan: General  ? ?Post-op Pain Management: Tylenol PO (pre-op)*  ? ?Induction: Intravenous ? ?PONV Risk Score and Plan: 2 and Ondansetron, Dexamethasone and Midazolam ? ?Airway Management Planned: LMA ? ?Additional Equipment:  ? ?Intra-op Plan:  ? ?Post-operative Plan: Extubation in OR ? ?Informed Consent: I have reviewed the patients History and Physical, chart, labs and discussed the procedure including the risks, benefits and alternatives for the proposed anesthesia with the patient or authorized representative who has indicated his/her understanding and acceptance.  ? ? ? ?Dental advisory given ? ?Plan Discussed with: CRNA ? ?Anesthesia Plan Comments:   ? ? ? ? ? ?Anesthesia Quick Evaluation ? ?

## 2021-12-27 NOTE — Op Note (Signed)
Nicole [name] ?PROCEDURE DATE: 12/27/2021 ? ?PREOPERATIVE DIAGNOSIS: 5 week missed abortion ?POSTOPERATIVE DIAGNOSIS: The same ?PROCEDURE:     Dilation and Evacuation ?SURGEON:  Mariel Aloe ? ?INDICATIONS: 26 y.o. G2P1011 with MAB at 5.[redacted] weeks gestation by measurement of the gestational sac and embryo, needing surgical completion.  Risks of surgery were discussed with the patient including but not limited to: bleeding which may require transfusion; infection which may require antibiotics; injury to uterus or surrounding organs; need for additional procedures including laparotomy or laparoscopy; possibility of intrauterine scarring which may impair future fertility; and other postoperative/anesthesia complications. Written informed consent was obtained.   ? ?FINDINGS:  A 8 week size uterus, small amounts of products of conception, specimen sent to pathology. ? ?ANESTHESIA: conscious sedation ?INTRAVENOUS FLUIDS:  400 ml of LR ?ESTIMATED BLOOD LOSS:  Less than 15 ml. ?UOP: 50 ml ?SPECIMENS:  Products of conception sent to pathology ?COMPLICATIONS:  None immediate. ? ?PROCEDURE DETAILS:  The patient received intravenous Doxycycline while in the preoperative area.  She was then taken to the operating room where general anesthesia was administered and was found to be adequate.  After an adequate timeout was performed, she was placed in the dorsal lithotomy position and examined; then prepped and draped in the sterile manner.   Her bladder was catheterized for 50 ml of clear, yellow urine. A vaginal speculum was then placed in the patient's vagina and a single tooth tenaculum was applied to the anterior lip of the cervix. The uterus was sounded to 8 cm The cervix was gently dilated to accommodate a 8 mm suction curette that was gently advanced to the uterine fundus. The suction device was then activated and curette slowly rotated to clear the uterus of products of conception.  Suction curettage was done until complete  emptying of the uterus was confirmed.  A gentle sharp curettage was performed until a gritty texture was detected.  A final pass with the suction curette was performed.  There was minimal bleeding noted and the tenaculum removed with good hemostasis noted.   All instruments were removed from the patient's vagina.  Sponge and instrument counts were correct times two  The patient tolerated the procedure well and was taken to the recovery area extubated, awake, and in stable condition. ? ?The patient will be discharged to home as per PACU criteria.  Routine postoperative instructions given.  She was prescribed  Ibuprofen .  She will follow up in the office in 2-3 weeks for postoperative evaluation ? ?Mariel Aloe, MD, FACOG ?Obstetrician Heritage manager, Faculty Practice ?Center for [employer], [facility] ?  ?

## 2021-12-30 ENCOUNTER — Encounter (HOSPITAL_COMMUNITY): Payer: Self-pay | Admitting: Obstetrics and Gynecology

## 2021-12-30 LAB — SURGICAL PATHOLOGY

## 2021-12-30 NOTE — Anesthesia Postprocedure Evaluation (Signed)
Anesthesia Post Note ? ?Patient: Nicole [name] ? ?Procedure(s) Performed: SUCTION DILATATION AND EVACUATION ? ?  ? ?Patient location during evaluation: PACU ?Anesthesia Type: General ?Level of consciousness: awake and alert ?Pain management: pain level controlled ?Vital Signs Assessment: post-procedure vital signs reviewed and stable ?Respiratory status: spontaneous breathing, nonlabored ventilation and respiratory function stable ?Cardiovascular status: blood pressure returned to baseline and stable ?Postop Assessment: no apparent nausea or vomiting ?Anesthetic complications: no ? ? ?No notable events documented. ? ?Last Vitals:  ?Vitals:  ? 12/27/21 1600 12/27/21 1615  ?BP: 97/63 104/76  ?Pulse: 69 66  ?Resp: 14 (!) 31  ?Temp:  37.2 ?C  ?SpO2: 99% 100%  ?  ?Last Pain:  ?Vitals:  ? 12/27/21 1545  ?TempSrc:   ?PainSc: 0-No pain  ? ?Pain Goal: Patients Stated Pain Goal: 3 (12/27/21 1445) ? ?  ?  ?  ?  ?  ?  ?  ? ?Lucretia Kern ? ? ? ? ?

## 2021-12-31 ENCOUNTER — Other Ambulatory Visit: Payer: Medicaid Other | Admitting: Adult Health

## 2022-01-10 ENCOUNTER — Ambulatory Visit: Payer: Medicaid Other | Admitting: Obstetrics & Gynecology

## 2022-01-10 ENCOUNTER — Encounter: Payer: Self-pay | Admitting: Obstetrics & Gynecology

## 2022-01-10 VITALS — BP 113/76 | HR 90 | Ht 67.0 in | Wt 165.0 lb

## 2022-01-10 DIAGNOSIS — Z9889 Other specified postprocedural states: Secondary | ICD-10-CM

## 2022-01-10 NOTE — Progress Notes (Signed)
?  HPI: ?Patient returns for routine postoperative follow-up having undergone suctin sharp D&C on 12/27/21.  ?The patient's immediate postoperative recovery has been unremarkable. ?Since hospital discharge the patient reports no problems. ? ? ?Current Outpatient Medications: ?amphetamine-dextroamphetamine (ADDERALL XR) 30 MG 24 hr capsule, TAKE ONE CAPSULE (30MG  TOTAL) BY MOUTH DAILY (Patient taking differently: Take 30 mg by mouth every morning.), Disp: 30 capsule, Rfl: 0 ?Multiple Vitamin (MULTIVITAMIN WITH MINERALS) TABS tablet, Take 1 tablet by mouth every morning., Disp: , Rfl:  ?acetaminophen (TYLENOL) 500 MG tablet, Take 1,000 mg by mouth 2 (two) times daily as needed (pain). (Patient not taking: Reported on 01/10/2022), Disp: , Rfl:  ?ibuprofen (ADVIL) 600 MG tablet, Take 1 tablet (600 mg total) by mouth every 6 (six) hours as needed for headache, mild pain, moderate pain or cramping. (Patient not taking: Reported on 01/10/2022), Disp: 30 tablet, Rfl: 1 ? ?No current facility-administered medications for this visit. ? ? ? ?Blood pressure 113/76, pulse 90, height 5\' 7"  (1.702 m), weight 165 lb (74.8 kg), not currently breastfeeding. ? ?Physical Exam: ?General WDWN female NAD ?Vulva:  normal appearing vulva with no masses, tenderness or lesions ?Vagina:  normal mucosa, no discharge ?Cervix:  Normal no lesions ?Uterus:  normal size, contour, position, consistency, mobility, non-tender ?Adnexa: ovaries:present,  normal adnexa in size, nontender and no masses ? ? ?Diagnostic Tests: ? ? ?Pathology: ?benign ? ?Impression + Management plan: ?  ICD-10-CM   ?1. Post-operative state  Z98.890   ? D&C for missed ab 12/27/21, no post op complications, undecided on BCM at present  ?  ? ? ? ? ? ?Medications Prescribed this encounter: ?No orders of the defined types were placed in this encounter. ? ? ? ? ?Follow up: ?No follow-ups on file.  ? ? ? , MD ?Attending Physician for the Center for [facility] and ?[facility] ?01/10/2022 ?10:31 AM ? ? ? ? ?

## 2022-01-14 ENCOUNTER — Other Ambulatory Visit: Payer: Medicaid Other | Admitting: Adult Health

## 2022-02-18 ENCOUNTER — Encounter: Payer: Self-pay | Admitting: Adult Health

## 2022-02-18 ENCOUNTER — Other Ambulatory Visit (HOSPITAL_COMMUNITY)
Admission: RE | Admit: 2022-02-18 | Discharge: 2022-02-18 | Disposition: A | Discharge status: Home / Self Care | Payer: Medicaid Other | Source: Ambulatory Visit | Attending: Adult Health | Admitting: Adult Health

## 2022-02-18 ENCOUNTER — Ambulatory Visit (INDEPENDENT_AMBULATORY_CARE_PROVIDER_SITE_OTHER): Payer: Medicaid Other | Admitting: Adult Health

## 2022-02-18 VITALS — BP 114/75 | HR 93 | Ht 66.0 in | Wt 164.5 lb

## 2022-02-18 DIAGNOSIS — N941 Unspecified dyspareunia: Secondary | ICD-10-CM | POA: Diagnosis not present

## 2022-02-18 DIAGNOSIS — Z8759 Personal history of other complications of pregnancy, childbirth and the puerperium: Secondary | ICD-10-CM | POA: Diagnosis not present

## 2022-02-18 DIAGNOSIS — Z0000 Encounter for general adult medical examination without abnormal findings: Secondary | ICD-10-CM

## 2022-02-18 DIAGNOSIS — Z01419 Encounter for gynecological examination (general) (routine) without abnormal findings: Secondary | ICD-10-CM | POA: Insufficient documentation

## 2022-02-18 DIAGNOSIS — F17200 Nicotine dependence, unspecified, uncomplicated: Secondary | ICD-10-CM

## 2022-02-18 DIAGNOSIS — F419 Anxiety disorder, unspecified: Secondary | ICD-10-CM | POA: Diagnosis not present

## 2022-02-18 DIAGNOSIS — R87810 Cervical high risk human papillomavirus (HPV) DNA test positive: Secondary | ICD-10-CM | POA: Insufficient documentation

## 2022-02-18 DIAGNOSIS — F32A Depression, unspecified: Secondary | ICD-10-CM

## 2022-02-18 NOTE — Progress Notes (Signed)
Patient ID: Nicole [name], female   DOB: [birth year], 26 y.o.   MRN: [id number] History of Present Illness: Nicole [name] is a 26 year old white female,married, G2P1011 in for well woman gyn exam and pap. She had recent miscarriage with D&E 12/27/21. She has had bleeding on and off and pain with sex, and did spot after sex. She has had clear nipple discharge noted in shower. Has good and bad days, seeing therapist, and is on adderall.  PCP is [facility].  Current Medications, Allergies, Past Medical History, Past Surgical History, Family History and Social History were reviewed in [employer] record.     Review of Systems: Patient denies any headaches, hearing loss, fatigue, blurred vision, shortness of breath, chest pain, abdominal pain, problems with bowel movements, or urination.  No joint pain. See HPI For positives.    Physical Exam:BP 114/75 (BP Location: Left Arm, Patient Position: Sitting, Cuff Size: Normal)   Pulse 93   Ht 5\' 6"  (1.676 m)   Wt 164 lb 8 oz (74.6 kg)   LMP 02/14/2022 (Approximate)   BMI 26.55 kg/m   General:  Well developed, well nourished, no acute distress Skin:  Warm and dry Neck:  Midline trachea, normal thyroid, good ROM, no lymphadenopathy Lungs; Clear to auscultation bilaterally Breast:  No dominant palpable mass, retraction, or nipple discharge Cardiovascular: Regular rate and rhythm Abdomen:  Soft, non tender, no hepatosplenomegaly Pelvic:  External genitalia is normal in appearance, no lesions.  The vagina is normal in appearance. Urethra has no lesions or masses. The cervix is bulbous. Pap with  GC/CHL and HR HPV genotyping performed. Uterus is felt to be normal size, shape, and contour.  No adnexal masses or tenderness noted.Bladder is non tender, no masses felt. Extremities/musculoskeletal:  No swelling or varicosities noted, no clubbing or cyanosis Psych:  No mood changes, alert and cooperative,seems happy AA is 1    02/18/2022    2:48 PM  10/24/2019    3:50 PM 09/30/2019   11:36 AM  Depression screen PHQ 2/9  Decreased Interest 3 0 3  Down, Depressed, Hopeless 1 0 2  PHQ - 2 Score 4 0 5  Altered sleeping 3  3  Tired, decreased energy 2  0  Change in appetite 1  3  Feeling bad or failure about yourself  3  2  Trouble concentrating 2  3  Moving slowly or fidgety/restless 3  0  Suicidal thoughts 1  2  PHQ-9 Score 19  18       02/18/2022    2:48 PM  GAD 7 : Generalized Anxiety Score  Nervous, Anxious, on Edge 3  Control/stop worrying 1  Worry too much - different things 3  Trouble relaxing 3  Restless 2  Easily annoyed or irritable 3  Afraid - awful might happen 2  Total GAD 7 Score 17      Upstream - 02/18/22 1442       Pregnancy Intention Screening   Does the patient want to become pregnant in the next year? Unsure    Does the patient's partner want to become pregnant in the next year? Unsure    Would the patient like to discuss contraceptive options today? No      Contraception Wrap Up   Current Method No Method - Other Reason    End Method No Method - Other Reason            Examination chaperoned by 02/20/22 LPN  Impression and  plan: 1. Encounter for gynecological examination with Papanicolaou smear of cervix Pap sent Physical in 1 year Pap in 3 if normal   2. History of miscarriage If sees any spontaneous nipple discharge, let me know will check TSH and PRL.  3. Anxiety and depression Declines meds Seeing therapist   4. Dyspareunia, female Try different positions   5. Cervical high risk human papillomavirus (HPV) DNA test positive Pap sent   6. Smoker She is not ready to stop smoking

## 2022-02-21 LAB — CYTOLOGY - PAP
Chlamydia: NEGATIVE
Comment: NEGATIVE
Comment: NEGATIVE
Comment: NEGATIVE
Comment: NEGATIVE
Comment: NORMAL
HPV 16: NEGATIVE
HPV 18 / 45: NEGATIVE
High risk HPV: POSITIVE — AB
Neisseria Gonorrhea: NEGATIVE

## 2022-02-24 ENCOUNTER — Encounter: Payer: Self-pay | Admitting: Adult Health

## 2022-02-24 ENCOUNTER — Telehealth: Payer: Self-pay

## 2022-02-24 DIAGNOSIS — R87612 Low grade squamous intraepithelial lesion on cytologic smear of cervix (LGSIL): Secondary | ICD-10-CM

## 2022-02-24 HISTORY — DX: Low grade squamous intraepithelial lesion on cytologic smear of cervix (LGSIL): R87.612

## 2022-02-24 NOTE — Telephone Encounter (Signed)
Pt called and stated that she was returning your call

## 2022-03-07 ENCOUNTER — Other Ambulatory Visit (HOSPITAL_COMMUNITY)
Admission: RE | Admit: 2022-03-07 | Discharge: 2022-03-07 | Disposition: A | Discharge status: Home / Self Care | Payer: Medicaid Other | Source: Ambulatory Visit | Attending: Obstetrics & Gynecology | Admitting: Obstetrics & Gynecology

## 2022-03-07 ENCOUNTER — Encounter: Payer: Self-pay | Admitting: Obstetrics & Gynecology

## 2022-03-07 ENCOUNTER — Ambulatory Visit (INDEPENDENT_AMBULATORY_CARE_PROVIDER_SITE_OTHER): Payer: Medicaid Other | Admitting: Obstetrics & Gynecology

## 2022-03-07 VITALS — BP 127/86 | HR 84 | Wt 167.0 lb

## 2022-03-07 DIAGNOSIS — N939 Abnormal uterine and vaginal bleeding, unspecified: Secondary | ICD-10-CM | POA: Diagnosis not present

## 2022-03-07 DIAGNOSIS — Z8759 Personal history of other complications of pregnancy, childbirth and the puerperium: Secondary | ICD-10-CM | POA: Diagnosis not present

## 2022-03-07 DIAGNOSIS — R300 Dysuria: Secondary | ICD-10-CM | POA: Diagnosis not present

## 2022-03-07 DIAGNOSIS — R87612 Low grade squamous intraepithelial lesion on cytologic smear of cervix (LGSIL): Secondary | ICD-10-CM

## 2022-03-07 LAB — POCT URINALYSIS DIPSTICK
Blood, UA: NEGATIVE
Glucose, UA: NEGATIVE
Ketones, UA: NEGATIVE
Leukocytes, UA: NEGATIVE
Nitrite, UA: NEGATIVE
Protein, UA: NEGATIVE

## 2022-03-07 NOTE — Progress Notes (Signed)
Patient name: Nicole [name] MRN [id number]  Date of birth: Aug 04, 1996 Chief Complaint:   Colposcopy  History of Present Illness:   Nicole [name] is a 26 y.o. G47P1011 female being seen today for cervical dysplasia management.  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   New sexual partner:  Yes.     -AUB: Recent SAB in April, s/p D&E 4/21.  Initially no bleeding x 3 weeks then had a heavy period and has continued to have light to moderate bleeding since that time.  Additionally noted milk-let down for a few days that has also now resolved. She does want another pregnancy, but emotionally concerned about having to go through something like that again.  Not interested in restarted OCPs.  For the past few days, just been feeling off.  Reports symptoms of UTI- notes urinary frequency and dysuria.  Additionally notes diarrhea x 3 days.  No nausea/vomiting.  No fever or chills.  Notes fatigue that has been a long-standing issue.  +weight gain and bloating.  Patient's last menstrual period was 02/14/2022 (approximate).     02/18/2022    2:48 PM 10/24/2019    3:50 PM 09/30/2019   11:36 AM 07/23/2018    2:15 PM 07/23/2018    2:11 PM  Depression screen PHQ 2/9  Decreased Interest 3 0 3 1 0  Down, Depressed, Hopeless 1 0 2 1 0  PHQ - 2 Score 4 0 5 2 0  Altered sleeping 3  3 2    Tired, decreased energy 2  0 1   Change in appetite 1  3 1    Feeling bad or failure about yourself  3  2 2    Trouble concentrating 2  3 2    Moving slowly or fidgety/restless 3  0 2   Suicidal thoughts 1  2 0   PHQ-9 Score 19  18 12    Difficult doing work/chores    Very difficult      Review of Systems:   Pertinent items are noted in HPI Pertinent History Reviewed:  Reviewed past medical,surgical, social, obstetrical and family history.  Reviewed problem list, medications and allergies. Physical Assessment:   Vitals:   03/07/22 1028  BP: 127/86  Pulse: 84   Weight: 167 lb (75.8 kg)  Body mass index is 26.95 kg/m.       Physical Examination:   General appearance: alert, well appearing, and in no distress  Psych: mood appropriate, normal affect  Skin: warm & dry   Cardiovascular: normal heart rate noted  Respiratory: normal respiratory effort, no distress  Abdomen: soft, non-tender, no rebound, no guarding  Pelvic: normal external genitalia, vulva, vagina, cervix, uterus and adnexa  Extremities: no edema   Chaperone:     Colposcopy Procedure Note  Indications: LSIL, HPV+  Procedure Details  The risks and benefits of the procedure and Written informed consent obtained.  Speculum placed in vagina and excellent visualization of cervix achieved, cervix swabbed x 3 with acetic acid solution.  Findings: Adequate colposcopy is noted today.  TMZ zone present  Cervix: acetowhite lesion(s) noted from 3 to 10 o'clock; ECC and cervical biopsies obtained.    Monsel's applied.  Adequate hemostasis noted  Specimens: ECC and cervical biopsies  Complications: none.  Colposcopic Impression: CIN 1   Plan(Based on 2019 ASCCP recommendations) Cervical dysplasia: -Discussed HPV- reviewed incidence and its potential to  cause condylomas to dysplasia to cervical cancer -Reviewed degree of abnormal pap smears  -Discussed ASCCP guidelines and current recommendations for colposcopy -As above, inform consent obtained and procedure completed -biopsies obtained, further management pending results -Questions and concerns were addressed  AUB: -plan for lab work in am -for now monitoring bleeding patter, may be due to recent SAB -if AUB not resolved by Sept/Oct RTC  Dysuria -UA/culture to be completed  Myna Hidalgo, DO Attending Obstetrician & Gynecologist, [facility] [employer], [facility]

## 2022-03-07 NOTE — Addendum Note (Signed)
Addended by: Moss Mc on: 03/07/2022 11:55 AM   Modules accepted: Orders

## 2022-03-08 LAB — URINALYSIS, ROUTINE W REFLEX MICROSCOPIC
Bilirubin, UA: NEGATIVE
Glucose, UA: NEGATIVE
Ketones, UA: NEGATIVE
Leukocytes,UA: NEGATIVE
Nitrite, UA: NEGATIVE
Protein,UA: NEGATIVE
RBC, UA: NEGATIVE
Specific Gravity, UA: 1.008 (ref 1.005–1.030)
Urobilinogen, Ur: 0.2 mg/dL (ref 0.2–1.0)
pH, UA: 6 (ref 5.0–7.5)

## 2022-03-09 LAB — URINE CULTURE

## 2022-03-10 LAB — SURGICAL PATHOLOGY

## 2022-03-12 ENCOUNTER — Encounter: Payer: Medicaid Other | Admitting: Obstetrics & Gynecology

## 2022-03-13 LAB — HEMOGLOBIN A1C
Est. average glucose Bld gHb Est-mCnc: 105 mg/dL
Hgb A1c MFr Bld: 5.3 % (ref 4.8–5.6)

## 2022-03-13 LAB — PROLACTIN: Prolactin: 18.1 ng/mL (ref 4.8–23.3)

## 2022-03-13 LAB — TSH: TSH: 1.67 u[IU]/mL (ref 0.450–4.500)

## 2022-03-13 LAB — BETA HCG QUANT (REF LAB): hCG Quant: <1 m[IU]/mL

## 2022-03-18 ENCOUNTER — Telehealth: Payer: Self-pay | Admitting: *Deleted

## 2022-03-18 NOTE — Telephone Encounter (Signed)
Patient informed lab normal. No further questions.

## 2022-08-04 ENCOUNTER — Ambulatory Visit (INDEPENDENT_AMBULATORY_CARE_PROVIDER_SITE_OTHER): Payer: Medicaid Other

## 2022-08-04 VITALS — BP 130/85 | HR 107 | Ht 67.0 in | Wt 161.5 lb

## 2022-08-04 DIAGNOSIS — Z3201 Encounter for pregnancy test, result positive: Secondary | ICD-10-CM | POA: Diagnosis not present

## 2022-08-04 LAB — POCT URINE PREGNANCY: Preg Test, Ur: POSITIVE — AB

## 2022-08-04 NOTE — Progress Notes (Signed)
   NURSE VISIT- PREGNANCY CONFIRMATION   SUBJECTIVE:  Nicole [name] is a 26 y.o. G46P1011 female at [redacted]w[redacted]d by certain LMP of Patient's last menstrual period was 07/02/2022 (exact date). Here for pregnancy confirmation.  Home pregnancy test: positive x 1   She reports no complaints.  She is taking prenatal vitamins.    OBJECTIVE:  BP 130/85 (BP Location: Left Arm, Patient Position: Sitting, Cuff Size: Normal)   Pulse (!) 107   Ht 5\' 7"  (1.702 m)   Wt 161 lb 8 oz (73.3 kg)   LMP 07/02/2022 (Exact Date)   BMI 25.29 kg/m   Appears well, in no apparent distress  Results for orders placed or performed in visit on 08/04/22 (from the past 24 hour(s))  POCT urine pregnancy   Collection Time: 08/04/22  2:56 PM  Result Value Ref Range   Preg Test, Ur Positive (A) Negative    ASSESSMENT: Positive pregnancy test, [redacted]w[redacted]d by LMP    PLAN: Schedule for dating ultrasound in 4 weeks Prenatal vitamins: continue   Nausea medicines: not currently needed   OB packet given: Yes  [redacted]w[redacted]d  08/04/2022 2:56 PM

## 2022-08-29 ENCOUNTER — Other Ambulatory Visit: Payer: Medicaid Other

## 2022-08-29 ENCOUNTER — Encounter: Payer: Medicaid Other | Admitting: *Deleted

## 2022-09-03 ENCOUNTER — Other Ambulatory Visit: Payer: Self-pay | Admitting: Obstetrics & Gynecology

## 2022-09-03 DIAGNOSIS — O3680X0 Pregnancy with inconclusive fetal viability, not applicable or unspecified: Secondary | ICD-10-CM

## 2022-09-04 ENCOUNTER — Ambulatory Visit (INDEPENDENT_AMBULATORY_CARE_PROVIDER_SITE_OTHER): Payer: Medicaid Other

## 2022-09-04 ENCOUNTER — Encounter: Payer: Self-pay | Admitting: *Deleted

## 2022-09-04 ENCOUNTER — Other Ambulatory Visit: Payer: Medicaid Other

## 2022-09-04 ENCOUNTER — Encounter: Payer: Medicaid Other | Admitting: *Deleted

## 2022-09-04 ENCOUNTER — Ambulatory Visit (INDEPENDENT_AMBULATORY_CARE_PROVIDER_SITE_OTHER): Payer: Medicaid Other | Admitting: *Deleted

## 2022-09-04 VITALS — BP 130/82 | HR 107 | Wt 164.0 lb

## 2022-09-04 DIAGNOSIS — Z3480 Encounter for supervision of other normal pregnancy, unspecified trimester: Secondary | ICD-10-CM

## 2022-09-04 DIAGNOSIS — Z3A09 9 weeks gestation of pregnancy: Secondary | ICD-10-CM | POA: Diagnosis not present

## 2022-09-04 DIAGNOSIS — O3680X0 Pregnancy with inconclusive fetal viability, not applicable or unspecified: Secondary | ICD-10-CM | POA: Diagnosis not present

## 2022-09-04 DIAGNOSIS — Z3490 Encounter for supervision of normal pregnancy, unspecified, unspecified trimester: Secondary | ICD-10-CM | POA: Insufficient documentation

## 2022-09-04 MED ORDER — BLOOD PRESSURE MONITOR MISC: 0 refills | Status: AC

## 2022-09-04 NOTE — Progress Notes (Cosign Needed)
INITIAL OB NURSE INTAKE  SUBJECTIVE:  Nicole [name] is a 26 y.o. G88P1011 female [redacted]w[redacted]d by LMP c/w today's U/S with an Estimated Date of Delivery: 04/08/23 being seen today for her initial OB intake/educational visit with RN. She is taking prenatal vitamins.  She is not having nausea and/or vomiting and does not request nausea meds at this time.  Patient's last menstrual period was 07/02/2022 (exact date).  Patient's medical, surgical, and obstetrical history obtained and reviewed.  Current medications reviewed.   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   ADHD (attention deficit hyperactivity disorder), combined type 12/22/2012   Past Medical History:  Diagnosis Date   ADD (attention deficit disorder)    BV (bacterial vaginosis) 03/18/2013   Treat ed with flagyl   Contraceptive management 05/29/2015   Depression    Encounter for insertion of mirena IUD 07/11/2016   07/11/16    Encounter for management of intrauterine contraceptive device (IUD) 08/20/2016   Encounter for surveillance of contraceptive pills 08/30/2018   History of prior pregnancy with IUGR newborn 06/09/2016   Irregular bleeding 08/08/2016   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   02/24/22 Pap is +HPV and shows LSIL,low grade lesion, needs colpo per ASCCP guidelines, immediate risk CIN 3+ is 5 %     Miscarriage    Supervision of normal pregnancy in first trimester 10/05/2015    [facility]  Initiated Care at   9+6 weeks  FOB  Dorian Heckle 26 yo WM  Dating By  LMP and [place]  Pap  10/05/15 negative +yeast  GC/CT Initial:     -/-           36+wks:  Genetic Screen AFP  CF screen   1 variant detected ; FOB declines  testing  Anatomic [place]   Flu vaccine  10/05/15  Tdap Recommended ~ 28wks  Glucose Screen  2 hr  GBS   Feed Preference   Contraception   Circumcision   Childbirth Classes   Pediatrician       Vertigo    Past Surgical History:  Procedure Laterality Date   COLONOSCOPY     DILATION AND EVACUATION N/A 12/27/2021   Procedure: SUCTION DILATATION AND EVACUATION;  Surgeon: Warden Fillers, MD;  Location: [facility] OR;  Service: Gynecology;  Laterality: N/A;   OB History     Gravida  3   Para  1   Term  1   Preterm  0   AB  1   Living  1      SAB  1   IAB  0   Ectopic  0   Multiple  0   Live Births  1           OBJECTIVE:  BP 130/82   Pulse (!) 107   Wt 164 lb (74.4 kg)   LMP 07/02/2022 (Exact Date)   BMI 25.69 kg/m   ASSESSMENT/PLAN: [id number] at [redacted]w[redacted]d with an Estimated Date of Delivery: 04/08/23  Prenatal vitamins: continue   Nausea medicines: not currently needed   OB packet given: Yes BabyScripts and MyChart activated  Blood Pressure Cuff: Rx sent today. Discussed to be used for virtual visits  and home BP checks.  Genetic & carrier screening discussed: declines Panorama, NT/IT, AFP, and Horizon  Placed OB Box on problem list and updated Reviewed recommended weight gain based on pre-gravid BMI BMI 25-29.9, gain max 15-25lb  Follow-up in 4 weeks for New OB visit with provider  Face-to-face time at least 30 minutes. 50% or more of this visit was spent in counseling and coordination of care.  Debbe Odea Lorren Rossetti RN-C 09/04/2022 3:56 PM

## 2022-09-04 NOTE — Progress Notes (Signed)
[place] 9+1 wks,single IUP with YS,CRL 23.93 mm,FHR 177 bpm,normal ovaries

## 2022-09-25 ENCOUNTER — Ambulatory Visit (INDEPENDENT_AMBULATORY_CARE_PROVIDER_SITE_OTHER): Payer: Medicaid Other | Admitting: Advanced Practice Midwife

## 2022-09-25 ENCOUNTER — Encounter: Payer: Self-pay | Admitting: Advanced Practice Midwife

## 2022-09-25 VITALS — BP 124/81 | HR 94 | Wt 167.0 lb

## 2022-09-25 DIAGNOSIS — Z3480 Encounter for supervision of other normal pregnancy, unspecified trimester: Secondary | ICD-10-CM

## 2022-09-25 DIAGNOSIS — Z3481 Encounter for supervision of other normal pregnancy, first trimester: Secondary | ICD-10-CM | POA: Diagnosis not present

## 2022-09-25 DIAGNOSIS — Z1332 Encounter for screening for maternal depression: Secondary | ICD-10-CM | POA: Diagnosis not present

## 2022-09-25 DIAGNOSIS — Z3A12 12 weeks gestation of pregnancy: Secondary | ICD-10-CM | POA: Diagnosis not present

## 2022-09-25 DIAGNOSIS — Z363 Encounter for antenatal screening for malformations: Secondary | ICD-10-CM | POA: Diagnosis not present

## 2022-09-25 DIAGNOSIS — Z113 Encounter for screening for infections with a predominantly sexual mode of transmission: Secondary | ICD-10-CM | POA: Diagnosis not present

## 2022-09-25 DIAGNOSIS — R87612 Low grade squamous intraepithelial lesion on cytologic smear of cervix (LGSIL): Secondary | ICD-10-CM

## 2022-09-25 MED ORDER — ONDANSETRON 4 MG PO TBDP: 4.0000 mg | ORAL_TABLET | Freq: Four times a day (QID) | ORAL | 2 refills | Status: DC | PRN

## 2022-09-25 NOTE — Progress Notes (Signed)
INITIAL OBSTETRICAL VISIT Patient name: Nicole [name] MRN [id number]  Date of birth: 1996/05/10 Chief Complaint:   Initial Prenatal Visit  History of Present Illness:   Nicole [name] is a 27 y.o. G55P1011 Caucasian female at [redacted]w[redacted]d by LMP c/w u/s at 9 weeks with an Estimated Date of Delivery: 04/08/23 being seen today for her initial obstetrical visit.   Her obstetrical history is significant for term SVD w/o problems.  Missed AB 6 months ago w/D&C. Marland Kitchen   Today she reports weaning off adderal, nervous about it.  Trying to quit smoking.  OK to vape     09/25/2022    3:33 PM 02/18/2022    2:48 PM 10/24/2019    3:50 PM 09/30/2019   11:36 AM 07/23/2018    2:15 PM  Depression screen PHQ 2/9  Decreased Interest 3 3 0 3 1  Down, Depressed, Hopeless 2 1 0 2 1  PHQ - 2 Score 5 4 0 5 2  Altered sleeping 3 3  3 2   Tired, decreased energy 3 2  0 1  Change in appetite 2 1  3 1   Feeling bad or failure about yourself  2 3  2 2   Trouble concentrating 3 2  3 2   Moving slowly or fidgety/restless 3 3  0 2  Suicidal thoughts 1 1  2  0  PHQ-9 Score 22 19  18 12   Difficult doing work/chores     Very difficult    Patient's last menstrual period was 07/02/2022 (exact date). Last pap 02/2022. Results were: HPV/LSIL. COlpo neg, repeat pap 02/2023 Review of Systems:   Pertinent items are noted in HPI Denies cramping/contractions, leakage of fluid, vaginal bleeding, abnormal vaginal discharge w/ itching/odor/irritation, headaches, visual changes, shortness of breath, chest pain, abdominal pain, severe nausea/vomiting, or problems with urination or bowel movements unless otherwise stated above.  Pertinent History Reviewed:  Reviewed past medical,surgical, social, obstetrical and family history.  Reviewed problem list, medications and allergies. OB History  Gravida Para Term Preterm AB Living  3 1 1  0 1 1  SAB IAB Ectopic Multiple Live Births  1 0 0 0 1    # Outcome Date GA Lbr Len/2nd Weight Sex Delivery  Anes PTL Lv  3 Current           2 SAB 2023          1 Term 04/29/16 [redacted]w[redacted]d 14:05 / 01:25 6 lb 13.4 oz (3.1 kg) F Vag-Spont EPI N LIV     Birth Comments: None noted   Physical Assessment:   Vitals:   09/25/22 1523  BP: 124/81  Pulse: 94  Weight: 167 lb (75.8 kg)  Body mass index is 26.16 kg/m.       Physical Examination:  General appearance - well appearing, and in no distress  Mental status - alert, oriented to person, place, and time  Psych:  She has a normal mood and affect  Skin - warm and dry, normal color, no suspicious lesions noted  Chest - effort normal  Heart - normal rate and regular rhythm  Abdomen - soft, nontender  Extremities:  No swelling or varicosities noted   No results found for this or any previous visit (from the past 24 hour(s)).   Indications for ASA therapy (per uptodate) One of the following: Previous pregnancy with preeclampsia, especially early onset and with an adverse outcome No Multifetal gestation No Chronic hypertension No Type 1 or 2 diabetes mellitus No Chronic kidney disease  No Autoimmune disease (antiphospholipid syndrome, systemic lupus erythematosus) No  Two or more of the following: Nulliparity No Obesity (body mass index >30 kg/m2) No Family history of preeclampsia in mother or sister No Age ?35 years No Sociodemographic characteristics (African American race, low socioeconomic level) No Personal risk factors (eg, previous pregnancy with low birth weight or small for gestational age infant, previous adverse pregnancy outcome [eg, stillbirth], interval >10 years between pregnancies) No  Indications for early GDM screening  First-degree relative with diabetes No BMI >30kg/m2 No Age > 35 No Previous birth of an infant weighing ?4000 g No Gestational diabetes mellitus in a previous pregnancy No Glycated hemoglobin ?5.7 percent (39 mmol/mol), impaired glucose tolerance, or impaired fasting glucose on previous testing No High-risk  race/ethnicity (eg, African American, Latino, Native American, Asian American, Pacific Islander) No Previous stillbirth of unknown cause No Maternal birthweight > 9 lbs No History of cardiovascular disease No Hypertension or on therapy for hypertension No High-density lipoprotein cholesterol level <35 mg/dL (4.09 mmol/L) and/or a triglyceride level >250 mg/dL (8.11 mmol/L) No Polycystic ovary syndrome No Physical inactivity No Other clinical condition associated with insulin resistance (eg, severe obesity, acanthosis nigricans) No Current use of glucocorticoids No     09/25/2022    3:33 PM 02/18/2022    2:48 PM 10/24/2019    3:50 PM 09/30/2019   11:36 AM 07/23/2018    2:15 PM  Depression screen PHQ 2/9  Decreased Interest 3 3 0 3 1  Down, Depressed, Hopeless 2 1 0 2 1  PHQ - 2 Score 5 4 0 5 2  Altered sleeping 3 3  3 2   Tired, decreased energy 3 2  0 1  Change in appetite 2 1  3 1   Feeling bad or failure about yourself  2 3  2 2   Trouble concentrating 3 2  3 2   Moving slowly or fidgety/restless 3 3  0 2  Suicidal thoughts 1 1  2  0  PHQ-9 Score 22 19  18 12   Difficult doing work/chores     Very difficult        09/25/2022    3:34 PM 02/18/2022    2:48 PM  GAD 7 : Generalized Anxiety Score  Nervous, Anxious, on Edge 2 3  Control/stop worrying 2 1  Worry too much - different things 3 3  Trouble relaxing 3 3  Restless 2 2  Easily annoyed or irritable 3 3  Afraid - awful might happen 2 2  Total GAD 7 Score 17 17      Assessment & Plan:  1) Low-Risk Pregnancy G3P1011 at [redacted]w[redacted]d with an Estimated Date of Delivery: 04/08/23   2) Initial OB visit  3) High PHQ 2/9 score  offered [facility] referral, already has a therapist, but would like the option of psychiatrist, so will accept the referral.   Meds: No orders of the defined types were placed in this encounter.   Initial labs obtained Continue prenatal vitamins Reviewed n/v relief measures and warning s/s to report Reviewed  recommended weight gain based on pre-gravid BMI Encouraged well-balanced diet Genetic & carrier screening discussed: declines Panorama, NT/IT, AFP, and Horizon ,   Ultrasound discussed; fetal survey: requested CCNC completed> form faxed if has or is planning to apply for medicaid The nature of for 09/27/2022 with multiple MDs and other Advanced Practice Providers was explained to patient; also emphasized that fellows, residents, and students are part of our team. Doesn't have a  home bp cuff. Rx faxed. Check bp weekly, let us know if >140/90.        Joaquim Lai Cresenzo-Dishmon 3:44 PM

## 2022-09-25 NOTE — Patient Instructions (Addendum)
Nicole [name], I greatly value your feedback.  If you receive a survey following your visit with [place] today, we appreciate you taking the time to fill it out.  Thanks, Nigel Berthold, DNP, [place]!!! It is now [place] [facility] ([place], [place] [address]) Entrance located off of [address] parking   Nausea & Vomiting Have saltine crackers or pretzels by your bed and eat a few bites before you raise your head out of bed in the morning Eat small frequent meals throughout the day instead of large meals Drink plenty of fluids throughout the day to stay hydrated, just don't drink a lot of fluids with your meals.  This can make your stomach fill up faster making you feel sick Do not brush your teeth right after you eat Products with real ginger are good for nausea, like ginger ale and ginger hard candy Make sure it says made with real ginger! Sucking on sour candy like lemon heads is also good for nausea If your prenatal vitamins make you nauseated, take them at night so you will sleep through the nausea Sea Bands If you feel like you need medicine for the nausea & vomiting please let us know If you are unable to keep any fluids or food down please let us know   Constipation Drink plenty of fluid, preferably water, throughout the day Eat foods high in fiber such as fruits, vegetables, and grains Exercise, such as walking, is a good way to keep your bowels regular Drink warm fluids, especially warm prune juice, or decaf coffee Eat a 1/2 cup of real oatmeal (not instant), 1/2 cup applesauce, and 1/2-1 cup warm prune juice every day If needed, you may take Colace (docusate sodium) stool softener once or twice a day to help keep the stool soft.  If you still are having problems with constipation, you may take Miralax once daily as needed to help keep your bowels regular.   Home Blood Pressure Monitoring for  Patients   Your provider has recommended that you check your blood pressure (BP) at least once a week at home. If you do not have a blood pressure cuff at home, one will be provided for you. Contact your provider if you have not received your monitor within 1 week.   Helpful Tips for Accurate Home Blood Pressure Checks  Don't smoke, exercise, or drink caffeine 30 minutes before checking your BP Use the restroom before checking your BP (a full bladder can raise your pressure) Relax in a comfortable upright chair Feet on the ground Left arm resting comfortably on a flat surface at the level of your heart Legs uncrossed Back supported Sit quietly and don't talk Place the cuff on your bare arm Adjust snuggly, so that only two fingertips can fit between your skin and the top of the cuff Check 2 readings separated by at least one minute Keep a log of your BP readings For a visual, please reference this diagram: http://ccnc.care/bpdiagram  Provider Name: [facility]     Phone: [phone]  Zone 1: ALL CLEAR  Continue to monitor your symptoms:  BP reading is less than 140 (top number) or less than 90 (bottom number)  No right upper stomach pain No headaches or seeing spots No feeling nauseated or throwing up No swelling in face and hands  Zone 2: CAUTION Call your doctor's office for any of the following:  BP reading is greater than 140 (top number) or greater than 90 (bottom number)  Stomach pain under your ribs in the middle or right side Headaches or seeing spots Feeling nauseated or throwing up Swelling in face and hands  Zone 3: EMERGENCY  Seek immediate medical care if you have any of the following:  BP reading is greater than160 (top number) or greater than 110 (bottom number) Severe headaches not improving with Tylenol Serious difficulty catching your breath Any worsening symptoms from Zone 2    First Trimester of Pregnancy The first trimester of pregnancy is from  week 1 until the end of week 12 (months 1 through 3). A week after a sperm fertilizes an egg, the egg will implant on the wall of the uterus. This embryo will begin to develop into a baby. Genes from you and your partner are forming the baby. The female genes determine whether the baby is a boy or a girl. At 6-8 weeks, the eyes and face are formed, and the heartbeat can be seen on ultrasound. At the end of 12 weeks, all the baby's organs are formed.  Now that you are pregnant, you will want to do everything you can to have a healthy baby. Two of the most important things are to get good prenatal care and to follow your health care provider's instructions. Prenatal care is all the medical care you receive before the baby's birth. This care will help prevent, find, and treat any problems during the pregnancy and childbirth. BODY CHANGES Your body goes through many changes during pregnancy. The changes vary from woman to woman.  You may gain or lose a couple of pounds at first. You may feel sick to your stomach (nauseous) and throw up (vomit). If the vomiting is uncontrollable, call your health care provider. You may tire easily. You may develop headaches that can be relieved by medicines approved by your health care provider. You may urinate more often. Painful urination may mean you have a bladder infection. You may develop heartburn as a result of your pregnancy. You may develop constipation because certain hormones are causing the muscles that push waste through your intestines to slow down. You may develop hemorrhoids or swollen, bulging veins (varicose veins). Your breasts may begin to grow larger and become tender. Your nipples may stick out more, and the tissue that surrounds them (areola) may become darker. Your gums may bleed and may be sensitive to brushing and flossing. Dark spots or blotches (chloasma, mask of pregnancy) may develop on your face. This will likely fade after the baby is  born. Your menstrual periods will stop. You may have a loss of appetite. You may develop cravings for certain kinds of food. You may have changes in your emotions from day to day, such as being excited to be pregnant or being concerned that something may go wrong with the pregnancy and baby. You may have more vivid and strange dreams. You may have changes in your hair. These can include thickening of your hair, rapid growth, and changes in texture. Some women also have hair loss during or after pregnancy, or hair that feels dry or thin. Your hair will most likely return to normal after your baby is born. WHAT TO EXPECT AT YOUR PRENATAL VISITS During a routine prenatal visit: You will be weighed to make sure you and the baby are growing normally. Your blood pressure will be taken. Your abdomen will be measured to track your baby's growth. The fetal  heartbeat will be listened to starting around week 10 or 12 of your pregnancy. Test results from any previous visits will be discussed. Your health care provider may ask you: How you are feeling. If you are feeling the baby move. If you have had any abnormal symptoms, such as leaking fluid, bleeding, severe headaches, or abdominal cramping. If you have any questions. Other tests that may be performed during your first trimester include: Blood tests to find your blood type and to check for the presence of any previous infections. They will also be used to check for low iron levels (anemia) and Rh antibodies. Later in the pregnancy, blood tests for diabetes will be done along with other tests if problems develop. Urine tests to check for infections, diabetes, or protein in the urine. An ultrasound to confirm the proper growth and development of the baby. An amniocentesis to check for possible genetic problems. Fetal screens for spina bifida and Down syndrome. You may need other tests to make sure you and the baby are doing well. HOME CARE  INSTRUCTIONS  Medicines Follow your health care provider's instructions regarding medicine use. Specific medicines may be either safe or unsafe to take during pregnancy. Take your prenatal vitamins as directed. If you develop constipation, try taking a stool softener if your health care provider approves. Diet Eat regular, well-balanced meals. Choose a variety of foods, such as meat or vegetable-based protein, fish, milk and low-fat dairy products, vegetables, fruits, and whole grain breads and cereals. Your health care provider will help you determine the amount of weight gain that is right for you. Avoid raw meat and uncooked cheese. These carry germs that can cause birth defects in the baby. Eating four or five small meals rather than three large meals a day may help relieve nausea and vomiting. If you start to feel nauseous, eating a few soda crackers can be helpful. Drinking liquids between meals instead of during meals also seems to help nausea and vomiting. If you develop constipation, eat more high-fiber foods, such as fresh vegetables or fruit and whole grains. Drink enough fluids to keep your urine clear or pale yellow. Activity and Exercise Exercise only as directed by your health care provider. Exercising will help you: Control your weight. Stay in shape. Be prepared for labor and delivery. Experiencing pain or cramping in the lower abdomen or low back is a good sign that you should stop exercising. Check with your health care provider before continuing normal exercises. Try to avoid standing for long periods of time. Move your legs often if you must stand in one place for a long time. Avoid heavy lifting. Wear low-heeled shoes, and practice good posture. You may continue to have sex unless your health care provider directs you otherwise. Relief of Pain or Discomfort Wear a good support bra for breast tenderness.   Take warm sitz baths to soothe any pain or discomfort caused by  hemorrhoids. Use hemorrhoid cream if your health care provider approves.   Rest with your legs elevated if you have leg cramps or low back pain. If you develop varicose veins in your legs, wear support hose. Elevate your feet for 15 minutes, 3-4 times a day. Limit salt in your diet. Prenatal Care Schedule your prenatal visits by the twelfth week of pregnancy. They are usually scheduled monthly at first, then more often in the last 2 months before delivery. Write down your questions. Take them to your prenatal visits. Keep all your prenatal visits as directed  by your health care provider. Safety Wear your seat belt at all times when driving. Make a list of emergency phone numbers, including numbers for family, friends, the hospital, and police and fire departments. General Tips Ask your health care provider for a referral to a local prenatal education class. Begin classes no later than at the beginning of month 6 of your pregnancy. Ask for help if you have counseling or nutritional needs during pregnancy. Your health care provider can offer advice or refer you to specialists for help with various needs. Do not use hot tubs, steam rooms, or saunas. Do not douche or use tampons or scented sanitary pads. Do not cross your legs for long periods of time. Avoid cat litter boxes and soil used by cats. These carry germs that can cause birth defects in the baby and possibly loss of the fetus by miscarriage or stillbirth. Avoid all smoking, herbs, alcohol, and medicines not prescribed by your health care provider. Chemicals in these affect the formation and growth of the baby. Schedule a dentist appointment. At home, brush your teeth with a soft toothbrush and be gentle when you floss. SEEK MEDICAL CARE IF:  You have dizziness. You have mild pelvic cramps, pelvic pressure, or nagging pain in the abdominal area. You have persistent nausea, vomiting, or diarrhea. You have a bad smelling vaginal  discharge. You have pain with urination. You notice increased swelling in your face, hands, legs, or ankles. SEEK IMMEDIATE MEDICAL CARE IF:  You have a fever. You are leaking fluid from your vagina. You have spotting or bleeding from your vagina. You have severe abdominal cramping or pain. You have rapid weight gain or loss. You vomit blood or material that looks like coffee grounds. You are exposed to [place] measles and have never had them. You are exposed to fifth disease or chickenpox. You develop a severe headache. You have shortness of breath. You have any kind of trauma, such as from a fall or a car accident. Document Released: 08/19/2001 Document Revised: 01/09/2014 Document Reviewed: 07/05/2013 [facility] Patient Information 2015 [place], [place]. This information is not intended to replace advice given to you by your health care provider. Make sure you discuss any questions you have with your health care provider.  Coronavirus (COVID-19) Are you at risk?  Are you at risk for the Coronavirus (COVID-19)?  To be considered HIGH RISK for Coronavirus (COVID-19), you have to meet the following criteria:  Traveled to [place], [place], [place], [place] or [place];  and have fever, cough, and shortness of breath within the last 2 weeks of travel OR Been in close contact with a person diagnosed with COVID-19 within the last 2 weeks and have fever, cough, and shortness of breath IF YOU DO NOT MEET THESE CRITERIA, YOU ARE CONSIDERED LOW RISK FOR COVID-19.  What to do if you are HIGH RISK for COVID-19?  If you are having a medical emergency, call 911. Seek medical care right away. Before you go to a doctor's office, urgent care or emergency department, call ahead and tell them about your recent travel, contact with someone diagnosed with COVID-19, and your symptoms. You should receive instructions from your physician's office regarding next steps of care.  When you arrive at healthcare provider,  tell the healthcare staff immediately you have returned from visiting [place], [place], [place], [place] or [place]; in the last two weeks or you have been in close contact with a person diagnosed with COVID-19 in the last 2 weeks.  Tell the health care staff about your symptoms: fever, cough and shortness of breath. After you have been seen by a medical provider, you will be either: Tested for (COVID-19) and discharged home on quarantine except to seek medical care if symptoms worsen, and asked to  Stay home and avoid contact with others until you get your results (4-5 days)  Avoid travel on public transportation if possible (such as bus, train, or airplane) or Sent to the Emergency Department by EMS for evaluation, COVID-19 testing, and possible admission depending on your condition and test results.  What to do if you are LOW RISK for COVID-19?  Reduce your risk of any infection by using the same precautions used for avoiding the common cold or flu:  Wash your hands often with soap and warm water for at least 20 seconds.  If soap and water are not readily available, use an alcohol-based hand sanitizer with at least 60% alcohol.  If coughing or sneezing, cover your mouth and nose by coughing or sneezing into the elbow areas of your shirt or coat, into a tissue or into your sleeve (not your hands). Avoid shaking hands with others and consider head nods or verbal greetings only. Avoid touching your eyes, nose, or mouth with unwashed hands.  Avoid close contact with people who are sick. Avoid places or events with large numbers of people in one location, like concerts or sporting events. Carefully consider travel plans you have or are making. If you are planning any travel outside or inside the [place], visit the CDC's Travelers' Health webpage for the latest health notices. If you have some symptoms but not all symptoms, continue to monitor at home and seek medical attention if your symptoms worsen. If  you are having a medical emergency, call 911.   ADDITIONAL HEALTHCARE OPTIONS FOR [place] / e-Visit: [email or web address]         [facility]: [facility]: [id number]                   [facility]: [phone]     Safe Medications in Pregnancy   Acne: Benzoyl Peroxide Salicylic Acid  Backache/Headache: Tylenol: 2 regular strength every 4 hours OR              2 Extra strength every 6 hours  Colds/Coughs/Allergies: Benadryl (alcohol free) 25 mg every 6 hours as needed Breath right strips Claritin Cepacol throat lozenges Chloraseptic throat spray Cold-Eeze- up to three times per day Cough drops, alcohol free Flonase (by prescription only) Guaifenesin Mucinex Robitussin DM (plain only, alcohol free) Saline nasal spray/drops Sudafed (pseudoephedrine) & Actifed ** use only after [redacted] weeks gestation and if you do not have high blood pressure Tylenol Vicks Vaporub Zinc lozenges Zyrtec   Constipation: Colace Ducolax suppositories Fleet enema Glycerin suppositories Metamucil Milk of magnesia Miralax Senokot Smooth move tea  Diarrhea: Kaopectate Imodium A-D  *NO pepto Bismol  Hemorrhoids: Anusol Anusol HC Preparation H Tucks  Indigestion: Tums Maalox Mylanta Zantac  Pepcid  Insomnia: Benadryl (alcohol free) 36m every 6 hours as needed Tylenol PM Unisom, no Gelcaps  Leg Cramps: Tums MagGel  Nausea/Vomiting:  Bonine Dramamine Emetrol Ginger extract Sea bands Meclizine  Nausea medication to take during pregnancy:  Unisom (doxylamine succinate 25 mg tablets) Take one tablet daily at bedtime. If symptoms are not adequately controlled, the dose can be increased to a maximum recommended dose of two tablets daily (1/2 tablet in  the morning, 1/2 tablet mid-afternoon and one at bedtime). Vitamin B6 100mg  tablets. Take one  tablet twice a day (up to 200 mg per day).  Skin Rashes: Aveeno products Benadryl cream or 25mg  every 6 hours as needed Calamine Lotion 1% cortisone cream  Yeast infection: Gyne-lotrimin 7 Monistat 7   **If taking multiple medications, please check labels to avoid duplicating the same active ingredients **take medication as directed on the label ** Do not exceed 4000 mg of tylenol in 24 hours **Do not take medications that contain aspirin or ibuprofen  [facility] offers online women's holistic mental health counseling and therapy provided by licensed mental health counselors and therapists.   You can refer yourself using the link below: (if it isn't clickable from your mychart account, copy and paste it in a new browser).  If you have ANY problems, please let me know and I will help troubleshoot.   [email or web address]

## 2022-09-26 LAB — CBC/D/PLT+RPR+RH+ABO+RUBIGG...
Antibody Screen: NEGATIVE
Basophils Absolute: 0 10*3/uL (ref 0.0–0.2)
Basos: 0 %
EOS (ABSOLUTE): 0 10*3/uL (ref 0.0–0.4)
Eos: 0 %
HCV Ab: NONREACTIVE
HIV Screen 4th Generation wRfx: NONREACTIVE
Hematocrit: 38.1 % (ref 34.0–46.6)
Hemoglobin: 13.1 g/dL (ref 11.1–15.9)
Hepatitis B Surface Ag: NEGATIVE
Immature Grans (Abs): 0 10*3/uL (ref 0.0–0.1)
Immature Granulocytes: 0 %
Lymphocytes Absolute: 3.3 10*3/uL — ABNORMAL HIGH (ref 0.7–3.1)
Lymphs: 29 %
MCH: 32.1 pg (ref 26.6–33.0)
MCHC: 34.4 g/dL (ref 31.5–35.7)
MCV: 93 fL (ref 79–97)
Monocytes Absolute: 0.7 10*3/uL (ref 0.1–0.9)
Monocytes: 6 %
Neutrophils Absolute: 7.3 10*3/uL — ABNORMAL HIGH (ref 1.4–7.0)
Neutrophils: 65 %
Platelets: 215 10*3/uL (ref 150–450)
RBC: 4.08 x10E6/uL (ref 3.77–5.28)
RDW: 13 % (ref 11.7–15.4)
RPR Ser Ql: NONREACTIVE
Rh Factor: POSITIVE
Rubella Antibodies, IGG: 1.12 index (ref >0.99)
WBC: 11.3 10*3/uL — ABNORMAL HIGH (ref 3.4–10.8)

## 2022-09-26 LAB — HCV INTERPRETATION

## 2022-09-27 LAB — URINE CULTURE

## 2022-09-28 LAB — GC/CHLAMYDIA PROBE AMP
Chlamydia trachomatis, NAA: NEGATIVE
Neisseria Gonorrhoeae by PCR: NEGATIVE

## 2022-09-29 ENCOUNTER — Encounter: Payer: Medicaid Other | Admitting: Women's Health

## 2022-09-29 ENCOUNTER — Other Ambulatory Visit: Payer: Medicaid Other

## 2022-10-22 ENCOUNTER — Ambulatory Visit (INDEPENDENT_AMBULATORY_CARE_PROVIDER_SITE_OTHER): Payer: Medicaid Other | Admitting: Advanced Practice Midwife

## 2022-10-22 VITALS — BP 117/73 | HR 86 | Wt 168.0 lb

## 2022-10-22 DIAGNOSIS — Z3480 Encounter for supervision of other normal pregnancy, unspecified trimester: Secondary | ICD-10-CM

## 2022-10-22 DIAGNOSIS — Z3A16 16 weeks gestation of pregnancy: Secondary | ICD-10-CM

## 2022-10-22 DIAGNOSIS — Z3482 Encounter for supervision of other normal pregnancy, second trimester: Secondary | ICD-10-CM

## 2022-10-22 DIAGNOSIS — F331 Major depressive disorder, recurrent, moderate: Secondary | ICD-10-CM

## 2022-10-22 MED ORDER — SERTRALINE HCL 50 MG PO TABS: 50.0000 mg | ORAL_TABLET | Freq: Every day | ORAL | 3 refills | Status: DC

## 2022-10-22 NOTE — Progress Notes (Signed)
   LOW-RISK PREGNANCY VISIT Patient name: CANNA NICKELSON MRN [id number]  Date of birth: Aug 11, 1996 Chief Complaint:   Routine Prenatal Visit  History of Present Illness:   YOSELIN AMERMAN is a 27 y.o. G91P1011 female at [redacted]w[redacted]d with an Estimated Date of Delivery: 04/08/23 being seen today for ongoing management of a low-risk pregnancy.  Today she reports  having intermittent 'acidic' vag d/c- not having currently; also has been having very labile emotions esp past 2 days . Contractions: Not present. Vag. Bleeding: None.  Movement: Absent. denies leaking of fluid. Review of Systems:   Pertinent items are noted in HPI Denies abnormal vaginal discharge w/ itching/odor/irritation, headaches, visual changes, shortness of breath, chest pain, abdominal pain, severe nausea/vomiting, or problems with urination or bowel movements unless otherwise stated above. Pertinent History Reviewed:  Reviewed past medical,surgical, social, obstetrical and family history.  Reviewed problem list, medications and allergies. Physical Assessment:   Vitals:   10/22/22 1336  BP: 117/73  Pulse: 86  Weight: 168 lb (76.2 kg)  Body mass index is 26.31 kg/m.        Physical Examination:   General appearance: Well appearing, and in no distress  Mental status: Alert, oriented to person, place, and time  Skin: Warm & dry  Cardiovascular: Normal heart rate noted  Respiratory: Normal respiratory effort, no distress  Abdomen: Soft, gravid, nontender  Pelvic: Cervical exam deferred         Extremities:    Fetal Status: Fetal Heart Rate (bpm): 155   Movement: Absent    No results found for this or any previous visit (from the past 24 hour(s)).  Assessment & Plan:  1) Low-risk pregnancy G3P1011 at [redacted]w[redacted]d with an Estimated Date of Delivery: 04/08/23   2) Depression, increasing labile emotions; just filled out [facility] QR code; will also rx Zoloft 50mg  per pt request (if insurance doesn't cover [facility] she won't be able to  participate and will want referral to [facility])  3) Intermittent vag d/c and rash, could be detergent/soap; if comes back, call to do RN visit with swab   Meds:  Meds ordered this encounter  Medications   sertraline (ZOLOFT) 50 MG tablet    Sig: Take 1 tablet (50 mg total) by mouth daily.    Dispense:  30 tablet    Refill:  3    Order Specific Question:   Supervising Provider    Answer:   Janyth Pupa [[id number]]   Labs/procedures today: none  Plan:  Continue routine obstetrical care   Reviewed: Preterm labor symptoms and general obstetric precautions including but not limited to vaginal bleeding, contractions, leaking of fluid and fetal movement were reviewed in detail with the patient.  All questions were answered. Didn't ask about home bp cuff. Check bp weekly, let us know if >140/90.   Follow-up: Return for As scheduled.(Anatomy u/s)  No orders of the defined types were placed in this encounter.  Myrtis Ser CNM 10/22/2022 2:02 PM

## 2022-10-22 NOTE — Patient Instructions (Signed)
Dickie, thank you for choosing our office today! We appreciate the opportunity to meet your healthcare needs. You may receive a short survey by mail, e-mail, or through [employer]. If you are happy with your care we would appreciate if you could take just a few minutes to complete the survey questions. We read all of your comments and take your feedback very seriously. Thank you again for choosing our office.  Center for [employer] Team at [place] at [facility] ([place], [place] [address]) Entrance C, located off of [address] parking  Go to [email or web address] to register for FREE online childbirth classes  Call the office [phone]) or go to [facility] if: You begin to severe cramping Your water breaks.  Sometimes it is a big gush of fluid, sometimes it is just a trickle that keeps getting your panties wet or running down your legs You have vaginal bleeding.  It is normal to have a small amount of spotting if your cervix was checked.   [facility] Pediatricians/Family Doctors [facility]): [address] [place], [facility]: [address] [place], [phone]                [place] [facility]): [place], [phone] (call to ask if accepting patients) [facility] Department: [address], [place], [place] Pediatricians/Family Doctors [facility]): [place]. [place], [address], [place] [facility]: [address] [place], [place] [facility]: [place], [place] [facility]): [phone] [facility]: [address] [place]: [address] [place] [facility]: [phone], [phone]  Home Blood Pressure Monitoring for Patients   Your provider has recommended that you check your blood pressure (BP) at least once a week at home. If you do not have a blood pressure cuff at home, one will be provided for you. Contact your provider if you have not received your monitor within 1 week.   Helpful Tips for Accurate Home Blood Pressure Checks  Don't smoke, exercise, or drink caffeine 30 minutes before checking your BP Use the restroom before checking your BP (a full bladder can raise your pressure) Relax in a comfortable upright chair Feet on the ground Left arm resting comfortably on a flat surface at the level of your heart Legs uncrossed Back supported Sit quietly and don't talk Place the cuff on your bare arm Adjust snuggly, so that only two fingertips can fit between your skin and the top of the cuff Check 2 readings separated by at least one minute Keep a log of your BP readings For a visual, please reference this diagram: http://ccnc.care/bpdiagram  Provider Name: [facility]     Phone: [phone]  Zone 1: ALL CLEAR  Continue to monitor your symptoms:  BP reading is less than 140 (top number) or less than 90 (bottom number)  No right upper stomach pain No headaches or seeing spots No feeling nauseated or throwing up No swelling in face and hands  Zone 2: CAUTION Call your doctor's office for any of the following:  BP reading is greater than 140 (top number) or greater than  90 (bottom number)  Stomach pain under your ribs in the middle or right side Headaches or seeing spots Feeling nauseated or throwing up Swelling in face and hands  Zone 3: EMERGENCY  Seek immediate medical care if you have any of the following:  BP reading is greater than160 (top number) or greater than 110 (bottom number) Severe headaches not improving with Tylenol Serious difficulty catching your breath Any worsening symptoms from  Zone 2     Second Trimester of Pregnancy The second trimester is from week 14 through week 27 (months 4 through 6). The second trimester is often a time when you feel your best. Your body has adjusted to being pregnant, and you begin to feel better physically. Usually, morning sickness has lessened or quit completely, you may have more energy, and you may have an increase in appetite. The second trimester is also a time when the fetus is growing rapidly. At the end of the sixth month, the fetus is about 9 inches long and weighs about 1 pounds. You will likely begin to feel the baby move (quickening) between 16 and 20 weeks of pregnancy. Body changes during your second trimester Your body continues to go through many changes during your second trimester. The changes vary from woman to woman. Your weight will continue to increase. You will notice your lower abdomen bulging out. You may begin to get stretch marks on your hips, abdomen, and breasts. You may develop headaches that can be relieved by medicines. The medicines should be approved by your health care provider. You may urinate more often because the fetus is pressing on your bladder. You may develop or continue to have heartburn as a result of your pregnancy. You may develop constipation because certain hormones are causing the muscles that push waste through your intestines to slow down. You may develop hemorrhoids or swollen, bulging veins (varicose veins). You may have back pain. This is caused by: Weight gain. Pregnancy hormones that are relaxing the joints in your pelvis. A shift in weight and the muscles that support your balance. Your breasts will continue to grow and they will continue to become tender. Your gums may bleed and may be sensitive to brushing and flossing. Dark spots or blotches (chloasma, mask of pregnancy) may develop on your face. This will likely fade after the baby is born. A dark line from your belly button to  the pubic area (linea nigra) may appear. This will likely fade after the baby is born. You may have changes in your hair. These can include thickening of your hair, rapid growth, and changes in texture. Some women also have hair loss during or after pregnancy, or hair that feels dry or thin. Your hair will most likely return to normal after your baby is born.  What to expect at prenatal visits During a routine prenatal visit: You will be weighed to make sure you and the fetus are growing normally. Your blood pressure will be taken. Your abdomen will be measured to track your baby's growth. The fetal heartbeat will be listened to. Any test results from the previous visit will be discussed.  Your health care provider may ask you: How you are feeling. If you are feeling the baby move. If you have had any abnormal symptoms, such as leaking fluid, bleeding, severe headaches, or abdominal cramping. If you are using any tobacco products, including cigarettes, chewing tobacco, and electronic cigarettes. If you have any questions.  Other tests that may be performed during  your second trimester include: Blood tests that check for: Low iron levels (anemia). High blood sugar that affects pregnant women (gestational diabetes) between 41 and 28 weeks. Rh antibodies. This is to check for a protein on red blood cells (Rh factor). Urine tests to check for infections, diabetes, or protein in the urine. An ultrasound to confirm the proper growth and development of the baby. An amniocentesis to check for possible genetic problems. Fetal screens for spina bifida and Down syndrome. HIV (human immunodeficiency virus) testing. Routine prenatal testing includes screening for HIV, unless you choose not to have this test.  Follow these instructions at home: Medicines Follow your health care provider's instructions regarding medicine use. Specific medicines may be either safe or unsafe to take during  pregnancy. Take a prenatal vitamin that contains at least 600 micrograms (mcg) of folic acid. If you develop constipation, try taking a stool softener if your health care provider approves. Eating and drinking Eat a balanced diet that includes fresh fruits and vegetables, whole grains, good sources of protein such as meat, eggs, or tofu, and low-fat dairy. Your health care provider will help you determine the amount of weight gain that is right for you. Avoid raw meat and uncooked cheese. These carry germs that can cause birth defects in the baby. If you have low calcium intake from food, talk to your health care provider about whether you should take a daily calcium supplement. Limit foods that are high in fat and processed sugars, such as fried and sweet foods. To prevent constipation: Drink enough fluid to keep your urine clear or pale yellow. Eat foods that are high in fiber, such as fresh fruits and vegetables, whole grains, and beans. Activity Exercise only as directed by your health care provider. Most women can continue their usual exercise routine during pregnancy. Try to exercise for 30 minutes at least 5 days a week. Stop exercising if you experience uterine contractions. Avoid heavy lifting, wear low heel shoes, and practice good posture. A sexual relationship may be continued unless your health care provider directs you otherwise. Relieving pain and discomfort Wear a good support bra to prevent discomfort from breast tenderness. Take warm sitz baths to soothe any pain or discomfort caused by hemorrhoids. Use hemorrhoid cream if your health care provider approves. Rest with your legs elevated if you have leg cramps or low back pain. If you develop varicose veins, wear support hose. Elevate your feet for 15 minutes, 3-4 times a day. Limit salt in your diet. Prenatal Care Write down your questions. Take them to your prenatal visits. Keep all your prenatal visits as told by your health  care provider. This is important. Safety Wear your seat belt at all times when driving. Make a list of emergency phone numbers, including numbers for family, friends, the hospital, and police and fire departments. General instructions Ask your health care provider for a referral to a local prenatal education class. Begin classes no later than the beginning of month 6 of your pregnancy. Ask for help if you have counseling or nutritional needs during pregnancy. Your health care provider can offer advice or refer you to specialists for help with various needs. Do not use hot tubs, steam rooms, or saunas. Do not douche or use tampons or scented sanitary pads. Do not cross your legs for long periods of time. Avoid cat litter boxes and soil used by cats. These carry germs that can cause birth defects in the baby and possibly loss of the  fetus by miscarriage or stillbirth. Avoid all smoking, herbs, alcohol, and unprescribed drugs. Chemicals in these products can affect the formation and growth of the baby. Do not use any products that contain nicotine or tobacco, such as cigarettes and e-cigarettes. If you need help quitting, ask your health care provider. Visit your dentist if you have not gone yet during your pregnancy. Use a soft toothbrush to brush your teeth and be gentle when you floss. Contact a health care provider if: You have dizziness. You have mild pelvic cramps, pelvic pressure, or nagging pain in the abdominal area. You have persistent nausea, vomiting, or diarrhea. You have a bad smelling vaginal discharge. You have pain when you urinate. Get help right away if: You have a fever. You are leaking fluid from your vagina. You have spotting or bleeding from your vagina. You have severe abdominal cramping or pain. You have rapid weight gain or weight loss. You have shortness of breath with chest pain. You notice sudden or extreme swelling of your face, hands, ankles, feet, or legs. You  have not felt your baby move in over an hour. You have severe headaches that do not go away when you take medicine. You have vision changes. Summary The second trimester is from week 14 through week 27 (months 4 through 6). It is also a time when the fetus is growing rapidly. Your body goes through many changes during pregnancy. The changes vary from woman to woman. Avoid all smoking, herbs, alcohol, and unprescribed drugs. These chemicals affect the formation and growth your baby. Do not use any tobacco products, such as cigarettes, chewing tobacco, and e-cigarettes. If you need help quitting, ask your health care provider. Contact your health care provider if you have any questions. Keep all prenatal visits as told by your health care provider. This is important. This information is not intended to replace advice given to you by your health care provider. Make sure you discuss any questions you have with your health care provider. Document Released: 08/19/2001 Document Revised: 01/31/2016 Document Reviewed: 10/26/2012 Elsevier Interactive Patient Education  2017 [employer].

## 2022-11-13 ENCOUNTER — Ambulatory Visit (INDEPENDENT_AMBULATORY_CARE_PROVIDER_SITE_OTHER): Payer: Medicaid Other | Admitting: Obstetrics and Gynecology

## 2022-11-13 ENCOUNTER — Ambulatory Visit (INDEPENDENT_AMBULATORY_CARE_PROVIDER_SITE_OTHER): Payer: Medicaid Other

## 2022-11-13 ENCOUNTER — Encounter: Payer: Self-pay | Admitting: Obstetrics and Gynecology

## 2022-11-13 VITALS — BP 127/84 | HR 94 | Wt 174.8 lb

## 2022-11-13 DIAGNOSIS — Z3480 Encounter for supervision of other normal pregnancy, unspecified trimester: Secondary | ICD-10-CM

## 2022-11-13 DIAGNOSIS — F32A Depression, unspecified: Secondary | ICD-10-CM

## 2022-11-13 DIAGNOSIS — F419 Anxiety disorder, unspecified: Secondary | ICD-10-CM

## 2022-11-13 DIAGNOSIS — O09899 Supervision of other high risk pregnancies, unspecified trimester: Secondary | ICD-10-CM

## 2022-11-13 DIAGNOSIS — Z3A19 19 weeks gestation of pregnancy: Secondary | ICD-10-CM | POA: Diagnosis not present

## 2022-11-13 DIAGNOSIS — Z363 Encounter for antenatal screening for malformations: Secondary | ICD-10-CM

## 2022-11-13 DIAGNOSIS — O09892 Supervision of other high risk pregnancies, second trimester: Secondary | ICD-10-CM

## 2022-11-13 DIAGNOSIS — Z141 Cystic fibrosis carrier: Secondary | ICD-10-CM

## 2022-11-13 DIAGNOSIS — G930 Cerebral cysts: Secondary | ICD-10-CM | POA: Insufficient documentation

## 2022-11-13 NOTE — Progress Notes (Signed)
Subjective:  Nicole [name] is a 27 y.o. G3P1011 at 31w1dbeing seen today for ongoing prenatal care.  She is currently monitored for the following issues for this low-risk pregnancy and has ADHD (attention deficit hyperactivity disorder), combined type; Cystic fibrosis carrier, antepartum; MDD (major depressive disorder); Breast mass, right; Anxiety and depression; Smoker; LGSIL on Pap smear of cervix; Encounter for supervision of normal pregnancy, antepartum; and Choroid plexus cyst on their problem list.  Patient reports no complaints.  Contractions: Not present. Vag. Bleeding: None.  Movement: Present. Denies leaking of fluid.   The following portions of the patient's history were reviewed and updated as appropriate: allergies, current medications, past family history, past medical history, past social history, past surgical history and problem list. Problem list updated.  Objective:   Vitals:   11/13/22 1504  BP: 127/84  Pulse: 94  Weight: 174 lb 12.8 oz (79.3 kg)    Fetal Status:     Movement: Present     General:  Alert, oriented and cooperative. Patient is in no acute distress.  Skin: Skin is warm and dry. No rash noted.   Cardiovascular: Normal heart rate noted  Respiratory: Normal respiratory effort, no problems with respiration noted  Abdomen: Soft, gravid, appropriate for gestational age. Pain/Pressure: Absent     Pelvic:  Cervical exam deferred        Extremities: Normal range of motion.     Mental Status: Normal mood and affect. Normal behavior. Normal judgment and thought content.   Urinalysis:      Assessment and Plan:  Pregnancy: G3P1011 at 147w1d1. Supervision of other normal pregnancy, antepartum Stable Anatomy scan today  2. Cystic fibrosis carrier, antepartum Partner declined testing  3. Anxiety and depression Improved with Zoloft  4. Choroid plexus cyst See note in problem list F/U U/S in 6 weeks  Preterm labor symptoms and general obstetric  precautions including but not limited to vaginal bleeding, contractions, leaking of fluid and fetal movement were reviewed in detail with the patient. Please refer to After Visit Summary for other counseling recommendations.  Return in about 4 weeks (around 12/11/2022) for OB visit, face to face, any provider.   ErChancy MilroyMD

## 2022-11-13 NOTE — Progress Notes (Signed)
[place] [id number] wks,cephalic,posterior placenta gr 0,normal ovaries,CX 3.6 cm,SVP of fluid 4.6 cm,complex right choroid plexus cyst 11 x 4 x 5 mm,EFW 286 g 55%,anatomy complete

## 2022-12-11 ENCOUNTER — Encounter: Payer: Self-pay | Admitting: Advanced Practice Midwife

## 2022-12-11 ENCOUNTER — Ambulatory Visit (INDEPENDENT_AMBULATORY_CARE_PROVIDER_SITE_OTHER): Payer: Medicaid Other | Admitting: Advanced Practice Midwife

## 2022-12-11 VITALS — BP 122/80 | HR 90 | Wt 183.0 lb

## 2022-12-11 DIAGNOSIS — Z3A23 23 weeks gestation of pregnancy: Secondary | ICD-10-CM

## 2022-12-11 DIAGNOSIS — O3503X0 Maternal care for (suspected) central nervous system malformation or damage in fetus, choroid plexus cysts, not applicable or unspecified: Secondary | ICD-10-CM

## 2022-12-11 DIAGNOSIS — Z3480 Encounter for supervision of other normal pregnancy, unspecified trimester: Secondary | ICD-10-CM

## 2022-12-11 NOTE — Progress Notes (Signed)
   LOW-RISK PREGNANCY VISIT Patient name: Nicole [name] MRN [id number]  Date of birth: [birth year] Chief Complaint:   Routine Prenatal Visit  History of Present Illness:   Nicole [name] is a 27 y.o. G62P1011 female at [redacted]w[redacted]d with an Estimated Date of Delivery: 04/08/23 being seen today for ongoing management of a low-risk pregnancy.  Today she reports .  Zoloft is working "great".  Cramps after sex, sciatica. Contractions: Not present.  .  Movement: Present. denies leaking of fluid. Review of Systems:   Pertinent items are noted in HPI Denies abnormal vaginal discharge w/ itching/odor/irritation, headaches, visual changes, shortness of breath, chest pain, abdominal pain, severe nausea/vomiting, or problems with urination or bowel movements unless otherwise stated above. Pertinent History Reviewed:  Reviewed past medical,surgical, social, obstetrical and family history.  Reviewed problem list, medications and allergies. Physical Assessment:   Vitals:   12/11/22 1502  BP: 122/80  Pulse: 90  Weight: 183 lb (83 kg)  Body mass index is 28.66 kg/m.        Physical Examination:   General appearance: Well appearing, and in no distress  Mental status: Alert, oriented to person, place, and time  Skin: Warm & dry  Cardiovascular: Normal heart rate noted  Respiratory: Normal respiratory effort, no distress  Abdomen: Soft, gravid, nontender  Pelvic: Cervical exam deferred         Extremities: Edema: None  Fetal Status: Fetal Heart Rate (bpm): 147   Movement: Present    Chaperone:  N/A    No results found for this or any previous visit (from the past 24 hour(s)).  Assessment & Plan:    Pregnancy: G3P1011 at [redacted]w[redacted]d 1. Supervision of other normal pregnancy, antepartum   2. [redacted] weeks gestation of pregnancy      Meds: No orders of the defined types were placed in this encounter.  Labs/procedures today: none  Plan:  Continue routine obstetrical care  Next visit: prefers will be in  person for PN2     Reviewed: Preterm labor symptoms and general obstetric precautions including but not limited to vaginal bleeding, contractions, leaking of fluid and fetal movement were reviewed in detail with the patient.  All questions were answered. Has home bp cuff.. Check bp weekly, let us know if >140/90.   Follow-up: Return in about 4 weeks (around 01/08/2023) for PN2/LROB.  Future Appointments  Date Time Provider [place]  12/24/2022  3:00 PM [facility] [place] [facility] None  12/24/2022  3:50 PM Myrtis Ser, CNM [facility]    Orders Placed This Encounter  Procedures   US OB Follow Up   Christin Fudge DNP, CNM 12/11/2022 3:31 PM

## 2022-12-11 NOTE — Patient Instructions (Addendum)
1. Before your test, do not eat or drink anything for 8-10 hours prior to your  appointment (a small amount of water is allowed and you may take any medicines you normally take). Be sure to drink lots of water the day before. 2. When you arrive, your blood will be drawn for a 'fasting' blood sugar level.  Then you will be given a sweetened carbonated beverage to drink. You should  complete drinking this beverage within five minutes. After finishing the  beverage, you will have your blood drawn exactly 1 and 2 hours later. Having  your blood drawn on time is an important part of this test. A total of three blood  samples will be done. 3. The test takes approximately 2  hours. During the test, do not have anything to  eat or drink. Do not smoke, chew gum (not even sugarless gum) or use breath mints.  4. During the test you should remain close by and seated as much as possible and  avoid walking around. You may want to bring a book or something else to  occupy your time.  5. After your test, you may eat and drink as normal. You may want to bring a snack  to eat after the test is finished. Your provider will advise you as to the results of  this test and any follow-up if necessary  If your sugar test is positive for gestational diabetes, you will be given an phone call and further instructions discussed. If you wish to know all of your test results before your next appointment, feel free to call the office, or look up your test results on Mychart.  (The range that the lab uses for normal values of the sugar test are not necessarily the range that is used for pregnant women; if your results are within the normal range, they are definitely normal.  However, if a value is deemed "high" by the lab, it may not be too high for a pregnant woman.  We will need to discuss the results if your value(s) fall in the "high" category).     Tdap Vaccine It is recommended that you get the Tdap vaccine during the  third trimester of EACH pregnancy to help protect your baby from getting pertussis (whooping cough) 27-36 weeks is the BEST time to do this so that you can pass the protection on to your baby. During pregnancy is better than after pregnancy, but if you are unable to get it during pregnancy it will be offered at the hospital. You will be offered this vaccine in the office after 27 weeks.  If you do not have health insurance, you can get the vaccine from the [facility] Department (no appointment needed).  Everyone who will be around your baby should also be up-to-date on their vaccines. Adults (who are not pregnant) only need 1 dose of Tdap during adulthood.  Sciatica Rehab Ask your health care provider which exercises are safe for you. Do exercises exactly as told by your health care provider and adjust them as directed. It is normal to feel mild stretching, pulling, tightness, or discomfort as you do these exercises, but you should stop right away if you feel sudden pain or your pain gets worse. Do not begin these exercises until told by your health care provider. Stretching and range of motion exercises These exercises warm up your muscles and joints and improve the movement and flexibility of your hips and your back. These exercises also  help to relieve pain, numbness, and tingling. Exercise A: Sciatic nerve glide  Sit in a chair with your head facing down toward your chest. Place your hands behind your back. Let your shoulders slump forward. Slowly straighten one of your knees while you tilt your head back as if you are looking toward the ceiling. Only straighten your leg as far as you can without making your symptoms worse. Hold for __________ seconds. Slowly return to the starting position. Repeat with your other leg. Repeat __________ times. Complete this exercise __________ times a day. Exercise B: Knee to chest with hip adduction and internal rotation     Lie on your back on  a firm surface with both legs straight. Bend one of your knees and move it up toward your chest until you feel a gentle stretch in your lower back and buttock. Then, move your knee toward the shoulder that is on the opposite side from your leg. Hold your leg in this position by holding onto the front of your knee. Hold for __________ seconds. Slowly return to the starting position. Repeat with your other leg. Repeat __________ times. Complete this exercise __________ times a day. Exercise C: Prone extension on elbows     Lie on your abdomen on a firm surface. A bed may be too soft for this exercise. Prop yourself up on your elbows. Use your arms to help lift your chest up until you feel a gentle stretch in your abdomen and your lower back. This will place some of your body weight on your elbows. If this is uncomfortable, try stacking pillows under your chest. Your hips should stay down, against the surface that you are lying on. Keep your hip and back muscles relaxed. Hold for __________ seconds. Slowly relax your upper body and return to the starting position. Repeat __________ times. Complete this exercise __________ times a day. Strengthening exercises These exercises build strength and endurance in your back. Endurance is the ability to use your muscles for a long time, even after they get tired. Exercise D: Pelvic tilt  Lie on your back on a firm surface. Bend your knees and keep your feet flat. Tense your abdominal muscles. Tip your pelvis up toward the ceiling and flatten your lower back into the floor. To help with this exercise, you may place a small towel under your lower back and try to push your back into the towel. Hold for __________ seconds. Let your muscles relax completely before you repeat this exercise. Repeat __________ times. Complete this exercise __________ times a day. Exercise E: Alternating arm and leg raises     Get on your hands and knees on a firm surface.  If you are on a hard floor, you may want to use padding to cushion your knees, such as an exercise mat. Line up your arms and legs. Your hands should be below your shoulders, and your knees should be below your hips. Lift your left leg behind you. At the same time, raise your right arm and straighten it in front of you. Do not lift your leg higher than your hip. Do not lift your arm higher than your shoulder. Keep your abdominal and back muscles tight. Keep your hips facing the ground. Do not arch your back. Keep your balance carefully, and do not hold your breath. Hold for __________ seconds. Slowly return to the starting position and repeat with your right leg and your left arm. Repeat __________ times. Complete this exercise __________ times a  day. Posture and body mechanics    Body mechanics refers to the movements and positions of your body while you do your daily activities. Posture is part of body mechanics. Good posture and healthy body mechanics can help to relieve stress in your body's tissues and joints. Good posture means that your spine is in its natural S-curve position (your spine is neutral), your shoulders are pulled back slightly, and your head is not tipped forward. The following are general guidelines for applying improved posture and body mechanics to your everyday activities. Standing     When standing, keep your spine neutral and your feet about hip-width apart. Keep a slight bend in your knees. Your ears, shoulders, and hips should line up. When you do a task in which you stand in one place for a long time, place one foot up on a stable object that is 2-4 inches (5-10 cm) high, such as a footstool. This helps keep your spine neutral. Sitting     When sitting, keep your spine neutral and keep your feet flat on the floor. Use a footrest, if necessary, and keep your thighs parallel to the floor. Avoid rounding your shoulders, and avoid tilting your head forward. When  working at a desk or a computer, keep your desk at a height where your hands are slightly lower than your elbows. Slide your chair under your desk so you are close enough to maintain good posture. When working at a computer, place your monitor at a height where you are looking straight ahead and you do not have to tilt your head forward or downward to look at the screen. Resting     When lying down and resting, avoid positions that are most painful for you. If you have pain with activities such as sitting, bending, stooping, or squatting (flexion-based activities), lie in a position in which your body does not bend very much. For example, avoid curling up on your side with your arms and knees near your chest (fetal position). If you have pain with activities such as standing for a long time or reaching with your arms (extension-based activities), lie with your spine in a neutral position and bend your knees slightly. Try the following positions: Lying on your side with a pillow between your knees. Lying on your back with a pillow under your knees. Lifting     When lifting objects, keep your feet at least shoulder-width apart and tighten your abdominal muscles. Bend your knees and hips and keep your spine neutral. It is important to lift using the strength of your legs, not your back. Do not lock your knees straight out. Always ask for help to lift heavy or awkward objects. This information is not intended to replace advice given to you by your health care provider. Make sure you discuss any questions you have with your health care provider. Document Released: 08/25/2005 Document Revised: 05/01/2016 Document Reviewed: 05/11/2015 Elsevier Interactive Patient Education  2017 [employer].

## 2022-12-24 ENCOUNTER — Encounter: Payer: Self-pay | Admitting: Advanced Practice Midwife

## 2022-12-24 ENCOUNTER — Ambulatory Visit (INDEPENDENT_AMBULATORY_CARE_PROVIDER_SITE_OTHER): Payer: Medicaid Other | Admitting: Advanced Practice Midwife

## 2022-12-24 ENCOUNTER — Ambulatory Visit (INDEPENDENT_AMBULATORY_CARE_PROVIDER_SITE_OTHER): Payer: Medicaid Other

## 2022-12-24 VITALS — BP 108/76 | HR 95 | Wt 182.0 lb

## 2022-12-24 DIAGNOSIS — Z3482 Encounter for supervision of other normal pregnancy, second trimester: Secondary | ICD-10-CM

## 2022-12-24 DIAGNOSIS — F331 Major depressive disorder, recurrent, moderate: Secondary | ICD-10-CM

## 2022-12-24 DIAGNOSIS — O3503X0 Maternal care for (suspected) central nervous system malformation or damage in fetus, choroid plexus cysts, not applicable or unspecified: Secondary | ICD-10-CM

## 2022-12-24 DIAGNOSIS — Z3A25 25 weeks gestation of pregnancy: Secondary | ICD-10-CM

## 2022-12-24 DIAGNOSIS — Z3480 Encounter for supervision of other normal pregnancy, unspecified trimester: Secondary | ICD-10-CM

## 2022-12-24 DIAGNOSIS — G930 Cerebral cysts: Secondary | ICD-10-CM

## 2022-12-24 NOTE — Patient Instructions (Signed)
Nicole [name], I greatly value your feedback.  If you receive a survey following your visit with [place] today, we appreciate you taking the time to fill it out.  Thanks, Philipp Deputy, CNM   You will have your sugar test next visit.  Please do not eat or drink anything after midnight the night before you come, not even water.  You will be here for at least two hours.  Please make an appointment online for the bloodwork at [email or web address] for 8:30am (or as close to this as possible). Make sure you select the [facility] service center. The day of the appointment, check in with our office first, then you will go to [facility] to start the sugar test.    [facility] HAS MOVED!!! It is now [facility] ([address] [place], [place] [address]) Entrance C, located off of [address] valet parking  Go to [email or web address] to register for FREE online childbirth classes   Call the office [phone]) or go to [facility] if: You begin to have strong, frequent contractions Your water breaks.  Sometimes it is a big gush of fluid, sometimes it is just a trickle that keeps getting your panties wet or running down your legs You have vaginal bleeding.  It is normal to have a small amount of spotting if your cervix was checked.  You don't feel your baby moving like normal.  If you don't, get you something to eat and drink and lay down and focus on feeling your baby move.   If your baby is still not moving like normal, you should call the office or go to [facility].  [place] Pediatricians/Family Doctors: [facility] [phone]           [facility] [phone]                [facility] [phone] (usually not accepting new patients unless you have family there already, you are always welcome to call and ask)      [facility] Department [phone]       [facility] Pediatricians/Family Doctors:  [facility]: [phone] [facility]: [phone] [facility]: [phone]  [facility] Doctors:  [facility]: [phone]  [facility]: [phone]  [facility] Doctors: [facility]: [phone]   Home Blood Pressure Monitoring for Patients   Your provider has recommended that you check your blood pressure (BP) at least once a week at home. If you do not have a blood pressure cuff at home, one will be provided for you. Contact your provider if you have not received your monitor within 1 week.   Helpful Tips for Accurate Home Blood Pressure Checks  Don't smoke, exercise, or drink caffeine 30 minutes before checking your BP Use the restroom before checking your BP (a full bladder can raise your pressure) Relax in a comfortable upright chair Feet on the ground Left arm resting comfortably on a flat surface at the level of your heart Legs uncrossed Back supported Sit quietly and don't talk Place the cuff on your bare arm Adjust snuggly, so that only two fingertips can fit between your skin and the top of the cuff Check 2 readings separated by at least one minute Keep a log of your BP readings For a visual, please reference this diagram: http://ccnc.care/bpdiagram  Provider Name: [facility]     Phone: [phone]  Zone 1: ALL CLEAR  Continue to monitor your symptoms:  BP reading is less than 140 (top number) or less than 90 (bottom number)  No right upper stomach pain No headaches or seeing spots No feeling nauseated or throwing up No swelling in face and hands  Zone 2: CAUTION Call your doctor's office for any of the following:  BP reading is greater than 140 (top number) or greater than 90 (bottom number)  Stomach pain under your ribs in the middle or right side Headaches or seeing spots Feeling nauseated or throwing up Swelling in face and hands  Zone 3: EMERGENCY  Seek  immediate medical care if you have any of the following:  BP reading is greater than160 (top number) or greater than 110 (bottom number) Severe headaches not improving with Tylenol Serious difficulty catching your breath Any worsening symptoms from Zone 2   Second Trimester of Pregnancy The second trimester is from week 13 through week 28, months 4 through 6. The second trimester is often a time when you feel your best. Your body has also adjusted to being pregnant, and you begin to feel better physically. Usually, morning sickness has lessened or quit completely, you may have more energy, and you may have an increase in appetite. The second trimester is also a time when the fetus is growing rapidly. At the end of the sixth month, the fetus is about 9 inches long and weighs about 1 pounds. You will likely begin to feel the baby move (quickening) between 18 and 20 weeks of the pregnancy. BODY CHANGES Your body goes through many changes during pregnancy. The changes vary from woman to woman.  Your weight will continue to increase. You will notice your lower abdomen bulging out. You may begin to get stretch marks on your hips, abdomen, and breasts. You may develop headaches that can be relieved by medicines approved by your health care provider. You may urinate more often because the fetus is pressing on your bladder. You may develop or continue to have heartburn as a result of your pregnancy. You may develop constipation because certain hormones are causing the muscles that push waste through your intestines to slow down. You may develop hemorrhoids or swollen, bulging veins (varicose veins). You may have back pain because of the weight gain and pregnancy hormones relaxing your joints between the bones in your pelvis and as a result of a shift in weight and the muscles that support your balance. Your breasts will continue to grow and be tender. Your gums may bleed and may be sensitive to brushing  and flossing. Dark spots or blotches (chloasma, mask of pregnancy) may develop on your face. This will likely fade after the baby is born. A dark line from your belly button to the pubic area (linea nigra) may appear. This will likely fade after the baby is born. You may have changes in your hair. These can include thickening of your hair, rapid growth, and changes in texture. Some women also have hair loss during or after pregnancy, or hair that feels dry or thin. Your hair will most likely return to normal after your baby is born. WHAT TO EXPECT AT YOUR PRENATAL VISITS During a routine prenatal visit: You will be weighed to make sure you and the fetus are growing normally. Your blood pressure will be taken. Your abdomen will be measured to track your baby's growth. The fetal heartbeat will be listened to. Any test results from the previous visit will be discussed. Your health care provider  may ask you: How you are feeling. If you are feeling the baby move. If you have had any abnormal symptoms, such as leaking fluid, bleeding, severe headaches, or abdominal cramping. If you have any questions. Other tests that may be performed during your second trimester include: Blood tests that check for: Low iron levels (anemia). Gestational diabetes (between 24 and 28 weeks). Rh antibodies. Urine tests to check for infections, diabetes, or protein in the urine. An ultrasound to confirm the proper growth and development of the baby. An amniocentesis to check for possible genetic problems. Fetal screens for spina bifida and Down syndrome. HOME CARE INSTRUCTIONS  Avoid all smoking, herbs, alcohol, and unprescribed drugs. These chemicals affect the formation and growth of the baby. Follow your health care provider's instructions regarding medicine use. There are medicines that are either safe or unsafe to take during pregnancy. Exercise only as directed by your health care provider. Experiencing  uterine cramps is a good sign to stop exercising. Continue to eat regular, healthy meals. Wear a good support bra for breast tenderness. Do not use hot tubs, steam rooms, or saunas. Wear your seat belt at all times when driving. Avoid raw meat, uncooked cheese, cat litter boxes, and soil used by cats. These carry germs that can cause birth defects in the baby. Take your prenatal vitamins. Try taking a stool softener (if your health care provider approves) if you develop constipation. Eat more high-fiber foods, such as fresh vegetables or fruit and whole grains. Drink plenty of fluids to keep your urine clear or pale yellow. Take warm sitz baths to soothe any pain or discomfort caused by hemorrhoids. Use hemorrhoid cream if your health care provider approves. If you develop varicose veins, wear support hose. Elevate your feet for 15 minutes, 3-4 times a day. Limit salt in your diet. Avoid heavy lifting, wear low heel shoes, and practice good posture. Rest with your legs elevated if you have leg cramps or low back pain. Visit your dentist if you have not gone yet during your pregnancy. Use a soft toothbrush to brush your teeth and be gentle when you floss. A sexual relationship may be continued unless your health care provider directs you otherwise. Continue to go to all your prenatal visits as directed by your health care provider. SEEK MEDICAL CARE IF:  You have dizziness. You have mild pelvic cramps, pelvic pressure, or nagging pain in the abdominal area. You have persistent nausea, vomiting, or diarrhea. You have a bad smelling vaginal discharge. You have pain with urination. SEEK IMMEDIATE MEDICAL CARE IF:  You have a fever. You are leaking fluid from your vagina. You have spotting or bleeding from your vagina. You have severe abdominal cramping or pain. You have rapid weight gain or loss. You have shortness of breath with chest pain. You notice sudden or extreme swelling of your face,  hands, ankles, feet, or legs. You have not felt your baby move in over an hour. You have severe headaches that do not go away with medicine. You have vision changes. Document Released: 08/19/2001 Document Revised: 08/30/2013 Document Reviewed: 10/26/2012 [facility] Patient Information 2015 [place], [place]. This information is not intended to replace advice given to you by your health care provider. Make sure you discuss any questions you have with your health care provider.

## 2022-12-24 NOTE — Progress Notes (Signed)
[place] 25 wks,cephalic,resolved CPC,cx 3.3 cm,normal ovaries,posterior placenta gr 0,FHR 148 bpm,SVP of fluid 4.2 cm,EFW 808 g 59%

## 2022-12-24 NOTE — Progress Notes (Signed)
   LOW-RISK PREGNANCY VISIT Patient name: Nicole [name] MRN [id number]  Date of birth: 01/08/1996 Chief Complaint:   Routine Prenatal Visit (Refill Zoloft)  History of Present Illness:   Nicole [name] is a 27 y.o. G53P1011 female at [redacted]w[redacted]d with an Estimated Date of Delivery: 04/08/23 being seen today for ongoing management of a low-risk pregnancy.  Today she reports  doing well . Contractions: Not present.  .  Movement: Present. denies leaking of fluid. Review of Systems:   Pertinent items are noted in HPI Denies abnormal vaginal discharge w/ itching/odor/irritation, headaches, visual changes, shortness of breath, chest pain, abdominal pain, severe nausea/vomiting, or problems with urination or bowel movements unless otherwise stated above. Pertinent History Reviewed:  Reviewed past medical,surgical, social, obstetrical and family history.  Reviewed problem list, medications and allergies. Physical Assessment:   Vitals:   12/24/22 1549  BP: 108/76  Pulse: 95  Weight: 182 lb (82.6 kg)  Body mass index is 28.51 kg/m.        Physical Examination:   General appearance: Well appearing, and in no distress  Mental status: Alert, oriented to person, place, and time  Skin: Warm & dry  Cardiovascular: Normal heart rate noted  Respiratory: Normal respiratory effort, no distress  Abdomen: Soft, gravid, nontender  Pelvic: Cervical exam deferred         Extremities: Edema: None  Fetal Status: Fetal Heart Rate (bpm): 148 u/s   Movement: Present    F/U CPC: [place] 25 wks,cephalic,resolved CPC,cx 3.3 cm,normal ovaries,posterior placenta gr 0,FHR 148 bpm,SVP of fluid 4.2 cm,EFW 808 g 59%   No results found for this or any previous visit (from the past 24 hour(s)).  Assessment & Plan:  1) Low-risk pregnancy G3P1011 at [redacted]w[redacted]d with an Estimated Date of Delivery: 04/08/23   2) CPC resolved  3) Depression, stable with Zoloft 50/75mg ; spoke w [facility] but was told they aren't covered by her insurance  (will check into that as providers were told [facility] was covered); she has another therapist she can use  Meds: No orders of the defined types were placed in this encounter.  Labs/procedures today: u/s  Plan:  Continue routine obstetrical care   Reviewed: Preterm labor symptoms and general obstetric precautions including but not limited to vaginal bleeding, contractions, leaking of fluid and fetal movement were reviewed in detail with the patient.  All questions were answered. Didn't receive home bp cuff. Rx refaxed Marchelle Folks. Check bp weekly, let us know if >140/90.   Follow-up: Return for As scheduled with PN2.  No orders of the defined types were placed in this encounter.  Arabella Merles CNM 12/24/2022 4:22 PM

## 2023-01-08 ENCOUNTER — Other Ambulatory Visit: Payer: Medicaid Other

## 2023-01-08 ENCOUNTER — Ambulatory Visit (INDEPENDENT_AMBULATORY_CARE_PROVIDER_SITE_OTHER): Payer: Medicaid Other | Admitting: Advanced Practice Midwife

## 2023-01-08 VITALS — BP 114/77 | HR 92 | Wt 189.4 lb

## 2023-01-08 DIAGNOSIS — Z3480 Encounter for supervision of other normal pregnancy, unspecified trimester: Secondary | ICD-10-CM

## 2023-01-08 DIAGNOSIS — Z3A27 27 weeks gestation of pregnancy: Secondary | ICD-10-CM | POA: Diagnosis not present

## 2023-01-08 DIAGNOSIS — R350 Frequency of micturition: Secondary | ICD-10-CM

## 2023-01-08 DIAGNOSIS — Z23 Encounter for immunization: Secondary | ICD-10-CM | POA: Diagnosis not present

## 2023-01-08 DIAGNOSIS — Z131 Encounter for screening for diabetes mellitus: Secondary | ICD-10-CM

## 2023-01-08 DIAGNOSIS — Z3482 Encounter for supervision of other normal pregnancy, second trimester: Secondary | ICD-10-CM | POA: Diagnosis not present

## 2023-01-08 NOTE — Progress Notes (Addendum)
   LOW-RISK PREGNANCY VISIT Patient name: Nicole [name] MRN [id number]  Date of birth: 03/04/1996 Chief Complaint:   Routine Prenatal Visit (PN2)  History of Present Illness:   Nicole [name] is a 27 y.o. G22P1011 female at 102w1d with an Estimated Date of Delivery: 04/08/23 being seen today for ongoing management of a low-risk pregnancy.  Today she reports no complaints  . Got a steroid shot 4 days ago and is on oral prednison for poison ivy.  Contractions: Not present.  .  Movement: Present. denies leaking of fluid. Review of Systems:   Pertinent items are noted in HPI Denies abnormal vaginal discharge w/ itching/odor/irritation, headaches, visual changes, shortness of breath, chest pain, abdominal pain, severe nausea/vomiting, or problems with urination or bowel movements unless otherwise stated above. Pertinent History Reviewed:  Reviewed past medical,surgical, social, obstetrical and family history.  Reviewed problem list, medications and allergies. Physical Assessment:   Vitals:   01/08/23 0839  BP: 114/77  Pulse: 92  Weight: 189 lb 6.4 oz (85.9 kg)  Body mass index is 29.66 kg/m.        Physical Examination:   General appearance: Well appearing, and in no distress  Mental status: Alert, oriented to person, place, and time  Skin: Warm & dry  Cardiovascular: Normal heart rate noted  Respiratory: Normal respiratory effort, no distress  Abdomen: Soft, gravid, nontender  Pelvic: Cervical exam deferred         Extremities: Edema: None  Fetal Status: Fetal Heart Rate (bpm): 148 Fundal Height: 25 cm Movement: Present    Chaperone:  N/A    No results found for this or any previous visit (from the past 24 hour(s)).  Assessment & Plan:    Pregnancy: G3P1011 at [redacted]w[redacted]d 1. Supervision of other normal pregnancy, antepartum      Meds: No orders of the defined types were placed in this encounter.  Labs/procedures today: PN2  Plan:  Continue routine obstetrical care  Next  visit: prefers in person    Reviewed: Preterm labor symptoms and general obstetric precautions including but not limited to vaginal bleeding, contractions, leaking of fluid and fetal movement were reviewed in detail with the patient.  All questions were answered. Has home bp cuff. Check bp weekly, let us know if >140/90.   Follow-up: Return in about 3 weeks (around 01/29/2023) for LROB.  Future Appointments  Date Time Provider Department Center  01/30/2023  8:30 AM [facility]  01/30/2023  9:10 AM Arabella Merles, CNM [facility]    Orders Placed This Encounter  Procedures   Urine Culture   Tdap vaccine greater than or equal to 7yo IM   Jacklyn Shell DNP, CNM 01/08/2023 11:05 AM

## 2023-01-08 NOTE — Patient Instructions (Signed)
Nicole [name], I greatly value your feedback.  If you receive a survey following your visit with [place] today, we appreciate you taking the time to fill it out.  Thanks, Cathie Beams, CNM   [facility] HAS [facility]OV[facility]!!! It is now [facility] at [facility] ([address] [place], [place] [address]) Entrance located off of [address] Free 24/7 valet parking   Go to [email or web address] to register for FREE online childbirth classes    Call the office [phone]) or go to [facility] if: You begin to have strong, frequent contractions Your water breaks.  Sometimes it is a big gush of fluid, sometimes it is just a trickle that keeps getting your panties wet or running down your legs You have vaginal bleeding.  It is normal to have a small amount of spotting if your cervix was checked.  You don't feel your baby moving like normal.  If you don't, get you something to eat and drink and lay down and focus on feeling your baby move.  You should feel at least 10 movements in 2 hours.  If you don't, you should call the office or go to [facility].    Tdap Vaccine It is recommended that you get the Tdap vaccine during the third trimester of EACH pregnancy to help protect your baby from getting pertussis (whooping cough) 27-36 weeks is the BEST time to do this so that you can pass the protection on to your baby. During pregnancy is better than after pregnancy, but if you are unable to get it during pregnancy it will be offered at the hospital.  You will be offered this vaccine in the office after 27 weeks. If you do not have health insurance, you can get this vaccine at the health department or your family doctor Everyone who will be around your baby should also be up-to-date on their vaccines. Adults (who are not pregnant) only need 1 dose of Tdap during adulthood.   Third Trimester of Pregnancy The third trimester is from week 29 through week 42, months 7 through 9. The third  trimester is a time when the fetus is growing rapidly. At the end of the ninth month, the fetus is about 20 inches in length and weighs 6-10 pounds.  BODY CHANGES Your body goes through many changes during pregnancy. The changes vary from woman to woman.  Your weight will continue to increase. You can expect to gain 25-35 pounds (11-16 kg) by the end of the pregnancy. You may begin to get stretch marks on your hips, abdomen, and breasts. You may urinate more often because the fetus is moving lower into your pelvis and pressing on your bladder. You may develop or continue to have heartburn as a result of your pregnancy. You may develop constipation because certain hormones are causing the muscles that push waste through your intestines to slow down. You may develop hemorrhoids or swollen, bulging veins (varicose veins). You may have pelvic pain because of the weight gain and pregnancy hormones relaxing your joints between the bones in your pelvis. Backaches may result from overexertion of the muscles supporting your posture. You may have changes in your hair. These can include thickening of your hair, rapid growth, and changes in texture. Some women also have hair loss during or after pregnancy, or hair that feels dry or thin. Your hair will most likely return to normal after your baby is born. Your breasts will continue to grow and be tender. A  yellow discharge may leak from your breasts called colostrum. Your belly button may stick out. You may feel short of breath because of your expanding uterus. You may notice the fetus "dropping," or moving lower in your abdomen. You may have a bloody mucus discharge. This usually occurs a few days to a week before labor begins. Your cervix becomes thin and soft (effaced) near your due date. WHAT TO EXPECT AT YOUR PRENATAL EXAMS  You will have prenatal exams every 2 weeks until week 36. Then, you will have weekly prenatal exams. During a routine prenatal  visit: You will be weighed to make sure you and the fetus are growing normally. Your blood pressure is taken. Your abdomen will be measured to track your baby's growth. The fetal heartbeat will be listened to. Any test results from the previous visit will be discussed. You may have a cervical check near your due date to see if you have effaced. At around 36 weeks, your caregiver will check your cervix. At the same time, your caregiver will also perform a test on the secretions of the vaginal tissue. This test is to determine if a type of bacteria, Group B streptococcus, is present. Your caregiver will explain this further. Your caregiver may ask you: What your birth plan is. How you are feeling. If you are feeling the baby move. If you have had any abnormal symptoms, such as leaking fluid, bleeding, severe headaches, or abdominal cramping. If you have any questions. Other tests or screenings that may be performed during your third trimester include: Blood tests that check for low iron levels (anemia). Fetal testing to check the health, activity level, and growth of the fetus. Testing is done if you have certain medical conditions or if there are problems during the pregnancy. FALSE LABOR You may feel small, irregular contractions that eventually go away. These are called Braxton Hicks contractions, or false labor. Contractions may last for hours, days, or even weeks before true labor sets in. If contractions come at regular intervals, intensify, or become painful, it is best to be seen by your caregiver.  SIGNS OF LABOR  Menstrual-like cramps. Contractions that are 5 minutes apart or less. Contractions that start on the top of the uterus and spread down to the lower abdomen and back. A sense of increased pelvic pressure or back pain. A watery or bloody mucus discharge that comes from the vagina. If you have any of these signs before the 37th week of pregnancy, call your caregiver right away.  You need to go to the hospital to get checked immediately. HOME CARE INSTRUCTIONS  Avoid all smoking, herbs, alcohol, and unprescribed drugs. These chemicals affect the formation and growth of the baby. Follow your caregiver's instructions regarding medicine use. There are medicines that are either safe or unsafe to take during pregnancy. Exercise only as directed by your caregiver. Experiencing uterine cramps is a good sign to stop exercising. Continue to eat regular, healthy meals. Wear a good support bra for breast tenderness. Do not use hot tubs, steam rooms, or saunas. Wear your seat belt at all times when driving. Avoid raw meat, uncooked cheese, cat litter boxes, and soil used by cats. These carry germs that can cause birth defects in the baby. Take your prenatal vitamins. Try taking a stool softener (if your caregiver approves) if you develop constipation. Eat more high-fiber foods, such as fresh vegetables or fruit and whole grains. Drink plenty of fluids to keep your urine clear or pale yellow.  Take warm sitz baths to soothe any pain or discomfort caused by hemorrhoids. Use hemorrhoid cream if your caregiver approves. If you develop varicose veins, wear support hose. Elevate your feet for 15 minutes, 3-4 times a day. Limit salt in your diet. Avoid heavy lifting, wear low heal shoes, and practice good posture. Rest a lot with your legs elevated if you have leg cramps or low back pain. Visit your dentist if you have not gone during your pregnancy. Use a soft toothbrush to brush your teeth and be gentle when you floss. A sexual relationship may be continued unless your caregiver directs you otherwise. Do not travel far distances unless it is absolutely necessary and only with the approval of your caregiver. Take prenatal classes to understand, practice, and ask questions about the labor and delivery. Make a trial run to the hospital. Pack your hospital bag. Prepare the baby's  nursery. Continue to go to all your prenatal visits as directed by your caregiver. SEEK MEDICAL CARE IF: You are unsure if you are in labor or if your water has broken. You have dizziness. You have mild pelvic cramps, pelvic pressure, or nagging pain in your abdominal area. You have persistent nausea, vomiting, or diarrhea. You have a bad smelling vaginal discharge. You have pain with urination. SEEK IMMEDIATE MEDICAL CARE IF:  You have a fever. You are leaking fluid from your vagina. You have spotting or bleeding from your vagina. You have severe abdominal cramping or pain. You have rapid weight loss or gain. You have shortness of breath with chest pain. You notice sudden or extreme swelling of your face, hands, ankles, feet, or legs. You have not felt your baby move in over an hour. You have severe headaches that do not go away with medicine. You have vision changes. Document Released: 08/19/2001 Document Revised: 08/30/2013 Document Reviewed: 10/26/2012 [facility] Patient Information 2015 [place], [place]. This information is not intended to replace advice given to you by your health care provider. Make sure you discuss any questions you have with your health care provider.

## 2023-01-09 LAB — CBC
Hematocrit: 30.9 % — ABNORMAL LOW (ref 34.0–46.6)
Hemoglobin: 10.4 g/dL — ABNORMAL LOW (ref 11.1–15.9)
MCH: 31.2 pg (ref 26.6–33.0)
MCHC: 33.7 g/dL (ref 31.5–35.7)
MCV: 93 fL (ref 79–97)
Platelets: 185 10*3/uL (ref 150–450)
RBC: 3.33 x10E6/uL — ABNORMAL LOW (ref 3.77–5.28)
RDW: 11.7 % (ref 11.7–15.4)
WBC: 12.8 10*3/uL — ABNORMAL HIGH (ref 3.4–10.8)

## 2023-01-09 LAB — RPR: RPR Ser Ql: NONREACTIVE

## 2023-01-09 LAB — HIV ANTIBODY (ROUTINE TESTING W REFLEX): HIV Screen 4th Generation wRfx: NONREACTIVE

## 2023-01-09 LAB — GLUCOSE TOLERANCE, 2 HOURS W/ 1HR
Glucose, 1 hour: 68 mg/dL — ABNORMAL LOW (ref 70–179)
Glucose, 2 hour: 78 mg/dL (ref 70–152)
Glucose, Fasting: 83 mg/dL (ref 70–91)

## 2023-01-09 LAB — ANTIBODY SCREEN: Antibody Screen: NEGATIVE

## 2023-01-10 LAB — URINE CULTURE

## 2023-01-12 ENCOUNTER — Other Ambulatory Visit: Payer: Medicaid Other

## 2023-01-30 ENCOUNTER — Encounter: Payer: Self-pay | Admitting: Advanced Practice Midwife

## 2023-01-30 ENCOUNTER — Other Ambulatory Visit: Payer: Medicaid Other

## 2023-01-30 ENCOUNTER — Ambulatory Visit (INDEPENDENT_AMBULATORY_CARE_PROVIDER_SITE_OTHER): Payer: Medicaid Other | Admitting: Advanced Practice Midwife

## 2023-01-30 VITALS — BP 121/86 | HR 105 | Wt 184.8 lb

## 2023-01-30 DIAGNOSIS — Z131 Encounter for screening for diabetes mellitus: Secondary | ICD-10-CM

## 2023-01-30 DIAGNOSIS — Z3A30 30 weeks gestation of pregnancy: Secondary | ICD-10-CM

## 2023-01-30 DIAGNOSIS — Z3480 Encounter for supervision of other normal pregnancy, unspecified trimester: Secondary | ICD-10-CM

## 2023-01-30 DIAGNOSIS — F17200 Nicotine dependence, unspecified, uncomplicated: Secondary | ICD-10-CM

## 2023-01-30 DIAGNOSIS — F32A Depression, unspecified: Secondary | ICD-10-CM

## 2023-01-30 DIAGNOSIS — F419 Anxiety disorder, unspecified: Secondary | ICD-10-CM

## 2023-01-30 DIAGNOSIS — Z302 Encounter for sterilization: Secondary | ICD-10-CM

## 2023-01-30 DIAGNOSIS — F902 Attention-deficit hyperactivity disorder, combined type: Secondary | ICD-10-CM

## 2023-01-30 NOTE — Progress Notes (Signed)
   LOW-RISK PREGNANCY VISIT Patient name: Nicole [name] MRN [id number]  Date of birth: [birth year] Chief Complaint:   Routine Prenatal Visit (Diarrhea, cramping)  History of Present Illness:   Nicole [name] is a 27 y.o. G85P1011 female at [redacted]w[redacted]d with an Estimated Date of Delivery: 04/08/23 being seen today for ongoing management of a low-risk pregnancy.  Today she reports  some joint discomfort and low abd cramping . Contractions: Not present. Vag. Bleeding: None.  Movement: Present. denies leaking of fluid. Review of Systems:   Pertinent items are noted in HPI Denies abnormal vaginal discharge w/ itching/odor/irritation, headaches, visual changes, shortness of breath, chest pain, abdominal pain, severe nausea/vomiting, or problems with urination or bowel movements unless otherwise stated above. Pertinent History Reviewed:  Reviewed past medical,surgical, social, obstetrical and family history.  Reviewed problem list, medications and allergies. Physical Assessment:   Vitals:   01/30/23 0918  BP: 121/86  Pulse: (!) 105  Weight: 184 lb 12.8 oz (83.8 kg)  Body mass index is 28.94 kg/m.        Physical Examination:   General appearance: Well appearing, and in no distress  Mental status: Alert, oriented to person, place, and time  Skin: Warm & dry  Cardiovascular: Normal heart rate noted  Respiratory: Normal respiratory effort, no distress  Abdomen: Soft, gravid, nontender  Pelvic: Cervical exam deferred         Extremities: Edema: None  Fetal Status: Fetal Heart Rate (bpm): 143 Fundal Height: 30 cm Movement: Present    No results found for this or any previous visit (from the past 24 hour(s)).  Assessment & Plan:  1) Low-risk pregnancy G3P1011 at [redacted]w[redacted]d with an Estimated Date of Delivery: 04/08/23   2) Cramping, rev'd s/s PTL and when to call  3) ADHD, Adderall 30mg   4) Anx/dep, stable w Zoloft 50-75mg   5) Request for ppBTL, 30d papers signed today   Meds: No orders of the  defined types were placed in this encounter.  Labs/procedures today: none  Plan:  Continue routine obstetrical care   Reviewed: Preterm labor symptoms and general obstetric precautions including but not limited to vaginal bleeding, contractions, leaking of fluid and fetal movement were reviewed in detail with the patient.  All questions were answered. Has home bp cuff. Check bp weekly, let us know if >140/90.   Follow-up: Return in about 2 weeks (around 02/13/2023) for LROB, MyChart video, Sign BTL consent today.  No orders of the defined types were placed in this encounter.  Arabella Merles CNM 01/30/2023 9:31 AM

## 2023-01-30 NOTE — Patient Instructions (Signed)
Nicole [name], thank you for choosing our office today! We appreciate the opportunity to meet your healthcare needs. You may receive a short survey by mail, e-mail, or through MyChart. If you are happy with your care we would appreciate if you could take just a few minutes to complete the survey questions. We read all of your comments and take your feedback very seriously. Thank you again for choosing our office.  [facility]  [facility] ([address] [place], [place] [address]) Entrance C, located off of [address] Free 24/7 valet parking   CLASSES: Go to [email or web address] to register for classes (childbirth, breastfeeding, waterbirth, infant CPR, daddy bootcamp, etc.)  Call the office ([phone]) or go to [facility] if:  You begin to have strong, frequent contractions  Your water breaks.  Sometimes it is a big gush of fluid, sometimes it is just a trickle that keeps getting your panties wet or running down your legs  You have vaginal bleeding.  It is normal to have a small amount of spotting if your cervix was checked.   You don't feel your baby moving like normal.  If you don't, get you something to eat and drink and lay down and focus on feeling your baby move.   If your baby is still not moving like normal, you should call the office or go to [facility].  Call the office ([phone]) or [facility] to [facility] for these signs of pre-eclampsia:  Severe headache that does not go away with Tylenol  Visual changes- seeing spots, double, blurred vision  Pain under your right breast or upper abdomen that does not go away with Tums or heartburn medicine  Nausea and/or vomiting  Severe swelling in your hands, feet, and face   Tdap Vaccine  It is recommended that you get the Tdap vaccine during the third trimester of EACH pregnancy to help protect your baby from getting pertussis (whooping cough)  27-36 weeks is  the BEST time to do this so that you can pass the protection on to your baby. During pregnancy is better than after pregnancy, but if you are unable to get it during pregnancy it will be offered at the hospital.   You can get this vaccine with us, at the health department, your family doctor, or some local pharmacies  Everyone who will be around your baby should also be up-to-date on their vaccines before the baby comes. Adults (who are not pregnant) only need 1 dose of Tdap during adulthood.   [place] Pediatricians/Family Doctors  [facility] ([facility]): [address], [phone]            [facility]: [address], [phone]                 [facility] ([facility]): [address], [phone] (call to ask if accepting patients)  [facility]: [address], [place], [phone]    [place] Pediatricians/Family Doctors  [facility] ([facility]): [address], [phone]  [facility]: [address], [phone]  [facility]: [address], [phone]  [place] [facility]   [facility] ([facility]): [phone]  [facility]: [address], [phone]   [place] [facility]  [facility]: [address], [phone]  [place] [facility]  . [facility]: [address], [phone]  Home Blood Pressure Monitoring for Patients   Your provider has recommended that you check your blood pressure (BP) at least once a week at home. If you do not have a blood pressure cuff at home, one will be provided for you. Contact your provider if you have not received your monitor within 1 week.   Helpful Tips for Accurate Home Blood Pressure Checks  . Don't smoke, exercise, or drink caffeine 30 minutes before checking your BP . Use the restroom before  checking your BP (a full bladder can raise your pressure) . Relax in a comfortable upright chair . Feet on the ground . Left arm resting comfortably on a flat surface at the level of your heart . Legs uncrossed . Back supported . Sit quietly and don't talk . Place the cuff on your bare arm . Adjust snuggly, so that only two fingertips can fit between your skin and the top of the cuff . Check 2 readings separated by at least one minute . Keep a log of your BP readings . For a visual, please reference this diagram: http://ccnc.care/bpdiagram  Provider Name: [facility]     Phone: [phone]  Zone 1: ALL CLEAR  Continue to monitor your symptoms:  . BP reading is less than 140 (top number) or less than 90 (bottom number)  . No right upper stomach pain . No headaches or seeing spots . No feeling nauseated or throwing up . No swelling in face and hands  Zone 2: CAUTION Call your doctor's office for any of the following:  . BP reading is greater than 140 (top number) or greater than 90 (bottom number)  . Stomach pain under your ribs in the middle or right side . Headaches or seeing spots . Feeling nauseated or throwing up . Swelling in face and hands  Zone 3: EMERGENCY  Seek immediate medical care if you have any of the following:  . BP reading is greater than160 (top number) or greater than 110 (bottom number) . Severe headaches not improving with Tylenol . Serious difficulty catching your breath . Any worsening symptoms from Zone 2   Third Trimester of Pregnancy The third trimester is from week 29 through week 42, months 7 through 9. The third trimester is a time when the fetus is growing rapidly. At the end of the ninth month, the fetus is about 20 inches in length and weighs 6-10 pounds.  BODY CHANGES Your body goes through many changes during pregnancy. The changes vary from woman to woman.   Your weight will continue to increase. You can expect to gain 25-35 pounds  (11-16 kg) by the end of the pregnancy.  You may begin to get stretch marks on your hips, abdomen, and breasts.  You may urinate more often because the fetus is moving lower into your pelvis and pressing on your bladder.  You may develop or continue to have heartburn as a result of your pregnancy.  You may develop constipation because certain hormones are causing the muscles that push waste through your intestines to slow down.  You may develop hemorrhoids or swollen, bulging veins (varicose veins).  You may have pelvic pain because of the weight gain and pregnancy hormones relaxing your joints between the bones in your pelvis. Backaches may result from overexertion of the muscles supporting your posture.  You may have changes in your hair. These can include thickening of your hair, rapid growth, and changes   in texture. Some women also have hair loss during or after pregnancy, or hair that feels dry or thin. Your hair will most likely return to normal after your baby is born.  Your breasts will continue to grow and be tender. A yellow discharge may leak from your breasts called colostrum.  Your belly button may stick out.  You may feel short of breath because of your expanding uterus.  You may notice the fetus "dropping," or moving lower in your abdomen.  You may have a bloody mucus discharge. This usually occurs a few days to a week before labor begins.  Your cervix becomes thin and soft (effaced) near your due date. WHAT TO EXPECT AT YOUR PRENATAL EXAMS  You will have prenatal exams every 2 weeks until week 36. Then, you will have weekly prenatal exams. During a routine prenatal visit:  You will be weighed to make sure you and the fetus are growing normally.  Your blood pressure is taken.  Your abdomen will be measured to track your baby's growth.  The fetal heartbeat will be listened to.  Any test results from the previous visit will be discussed.  You may have a cervical  check near your due date to see if you have effaced. At around 36 weeks, your caregiver will check your cervix. At the same time, your caregiver will also perform a test on the secretions of the vaginal tissue. This test is to determine if a type of bacteria, Group B streptococcus, is present. Your caregiver will explain this further. Your caregiver may ask you:  What your birth plan is.  How you are feeling.  If you are feeling the baby move.  If you have had any abnormal symptoms, such as leaking fluid, bleeding, severe headaches, or abdominal cramping.  If you have any questions. Other tests or screenings that may be performed during your third trimester include:  Blood tests that check for low iron levels (anemia).  Fetal testing to check the health, activity level, and growth of the fetus. Testing is done if you have certain medical conditions or if there are problems during the pregnancy. FALSE LABOR You may feel small, irregular contractions that eventually go away. These are called Braxton Hicks contractions, or false labor. Contractions may last for hours, days, or even weeks before true labor sets in. If contractions come at regular intervals, intensify, or become painful, it is best to be seen by your caregiver.  SIGNS OF LABOR   Menstrual-like cramps.  Contractions that are 5 minutes apart or less.  Contractions that start on the top of the uterus and spread down to the lower abdomen and back.  A sense of increased pelvic pressure or back pain.  A watery or bloody mucus discharge that comes from the vagina. If you have any of these signs before the 37th week of pregnancy, call your caregiver right away. You need to go to the hospital to get checked immediately. HOME CARE INSTRUCTIONS   Avoid all smoking, herbs, alcohol, and unprescribed drugs. These chemicals affect the formation and growth of the baby.  Follow your caregiver's instructions regarding medicine use.  There are medicines that are either safe or unsafe to take during pregnancy.  Exercise only as directed by your caregiver. Experiencing uterine cramps is a good sign to stop exercising.  Continue to eat regular, healthy meals.  Wear a good support bra for breast tenderness.  Do not use hot tubs, steam rooms, or saunas.  Wear your   seat belt at all times when driving.  Avoid raw meat, uncooked cheese, cat litter boxes, and soil used by cats. These carry germs that can cause birth defects in the baby.  Take your prenatal vitamins.  Try taking a stool softener (if your caregiver approves) if you develop constipation. Eat more high-fiber foods, such as fresh vegetables or fruit and whole grains. Drink plenty of fluids to keep your urine clear or pale yellow.  Take warm sitz baths to soothe any pain or discomfort caused by hemorrhoids. Use hemorrhoid cream if your caregiver approves.  If you develop varicose veins, wear support hose. Elevate your feet for 15 minutes, 3-4 times a day. Limit salt in your diet.  Avoid heavy lifting, wear low heal shoes, and practice good posture.  Rest a lot with your legs elevated if you have leg cramps or low back pain.  Visit your dentist if you have not gone during your pregnancy. Use a soft toothbrush to brush your teeth and be gentle when you floss.  A sexual relationship may be continued unless your caregiver directs you otherwise.  Do not travel far distances unless it is absolutely necessary and only with the approval of your caregiver.  Take prenatal classes to understand, practice, and ask questions about the labor and delivery.  Make a trial run to the hospital.  Pack your hospital bag.  Prepare the baby's nursery.  Continue to go to all your prenatal visits as directed by your caregiver. SEEK MEDICAL CARE IF:  You are unsure if you are in labor or if your water has broken.  You have dizziness.  You have mild pelvic cramps, pelvic  pressure, or nagging pain in your abdominal area.  You have persistent nausea, vomiting, or diarrhea.  You have a bad smelling vaginal discharge.  You have pain with urination. SEEK IMMEDIATE MEDICAL CARE IF:   You have a fever.  You are leaking fluid from your vagina.  You have spotting or bleeding from your vagina.  You have severe abdominal cramping or pain.  You have rapid weight loss or gain.  You have shortness of breath with chest pain.  You notice sudden or extreme swelling of your face, hands, ankles, feet, or legs.  You have not felt your baby move in over an hour.  You have severe headaches that do not go away with medicine.  You have vision changes. Document Released: 08/19/2001 Document Revised: 08/30/2013 Document Reviewed: 10/26/2012 ExitCare Patient Information 2015 [employer]. This information is not intended to replace advice given to you by your health care provider. Make sure you discuss any questions you have with your health care provider.        

## 2023-02-13 ENCOUNTER — Telehealth: Payer: Medicaid Other | Admitting: Obstetrics and Gynecology

## 2023-02-17 ENCOUNTER — Ambulatory Visit (INDEPENDENT_AMBULATORY_CARE_PROVIDER_SITE_OTHER): Payer: Medicaid Other | Admitting: Women's Health

## 2023-02-17 ENCOUNTER — Encounter: Payer: Self-pay | Admitting: Women's Health

## 2023-02-17 VITALS — BP 130/87 | HR 95 | Wt 189.0 lb

## 2023-02-17 DIAGNOSIS — F419 Anxiety disorder, unspecified: Secondary | ICD-10-CM

## 2023-02-17 DIAGNOSIS — Z3483 Encounter for supervision of other normal pregnancy, third trimester: Secondary | ICD-10-CM

## 2023-02-17 DIAGNOSIS — F32A Depression, unspecified: Secondary | ICD-10-CM

## 2023-02-17 DIAGNOSIS — F418 Other specified anxiety disorders: Secondary | ICD-10-CM

## 2023-02-17 DIAGNOSIS — Z3A32 32 weeks gestation of pregnancy: Secondary | ICD-10-CM

## 2023-02-17 DIAGNOSIS — Z3480 Encounter for supervision of other normal pregnancy, unspecified trimester: Secondary | ICD-10-CM

## 2023-02-17 DIAGNOSIS — F909 Attention-deficit hyperactivity disorder, unspecified type: Secondary | ICD-10-CM

## 2023-02-17 DIAGNOSIS — O26843 Uterine size-date discrepancy, third trimester: Secondary | ICD-10-CM

## 2023-02-17 MED ORDER — HYDROCORTISONE ACETATE 25 MG RE SUPP
25.0000 mg | Freq: Two times a day (BID) | RECTAL | 0 refills | Status: DC
Start: 2023-02-17 — End: 2023-04-01

## 2023-02-17 MED ORDER — SERTRALINE HCL 25 MG PO TABS
25.0000 mg | ORAL_TABLET | Freq: Every day | ORAL | 6 refills | Status: DC
Start: 2023-02-17 — End: 2023-03-26

## 2023-02-17 NOTE — Patient Instructions (Signed)
Dyana, thank you for choosing our office today! We appreciate the opportunity to meet your healthcare needs. You may receive a short survey by mail, e-mail, or through MyChart. If you are happy with your care we would appreciate if you could take just a few minutes to complete the survey questions. We read all of your comments and take your feedback very seriously. Thank you again for choosing our office.  [facility]  [facility] ([address] [place], [place] [address]) Entrance C, located off of [address] Free 24/7 valet parking   CLASSES: Go to [email or web address] to register for classes (childbirth, breastfeeding, waterbirth, infant CPR, daddy bootcamp, etc.)  Call the office ([phone]) or go to [facility] if: You begin to have strong, frequent contractions Your water breaks.  Sometimes it is a big gush of fluid, sometimes it is just a trickle that keeps getting your panties wet or running down your legs You have vaginal bleeding.  It is normal to have a small amount of spotting if your cervix was checked.  You don't feel your baby moving like normal.  If you don't, get you something to eat and drink and lay down and focus on feeling your baby move.   If your baby is still not moving like normal, you should call the office or go to [facility].  Call the office ([phone]) or [facility] for these signs of pre-eclampsia: Severe headache that does not go away with Tylenol Visual changes- seeing spots, double, blurred vision Pain under your right breast or upper abdomen that does not go away with Tums or heartburn medicine Nausea and/or vomiting Severe swelling in your hands, feet, and face   Tdap Vaccine It is recommended that you get the Tdap vaccine during the third trimester of EACH pregnancy to help protect your baby from getting pertussis (whooping cough) 27-36 weeks is the BEST time to do  this so that you can pass the protection on to your baby. During pregnancy is better than after pregnancy, but if you are unable to get it during pregnancy it will be offered at the hospital.  You can get this vaccine with us, at the health department, your family doctor, or some local pharmacies Everyone who will be around your baby should also be up-to-date on their vaccines before the baby comes. Adults (who are not pregnant) only need 1 dose of Tdap during adulthood.   [place] Pediatricians/Family Doctors [facility] ([facility]): [address], [phone]           [facility]: [address], [phone]                [facility] ([facility]): [address], [phone] (call to ask if accepting patients) [facility]: [address], [place], [phone]    [place] Pediatricians/Family Doctors [facility] ([facility]): [address], [phone] [facility]: [address], [phone] [facility]: [address], [phone]  [place] [facility]  [facility] ([facility]): [phone] [facility]: [address], [phone]   [place] [facility]: [address], [phone]  [place] [facility]  [facility]: [address], [phone]  Home Blood Pressure Monitoring for Patients   Your provider has recommended that you check your   blood pressure (BP) at least once a week at home. If you do not have a blood pressure cuff at home, one will be provided for you. Contact your provider if you have not received your monitor within 1 week.   Helpful Tips for Accurate Home Blood Pressure Checks  Don't smoke, exercise, or drink caffeine 30 minutes before checking your BP Use the restroom before checking your BP (a full bladder can raise your  pressure) Relax in a comfortable upright chair Feet on the ground Left arm resting comfortably on a flat surface at the level of your heart Legs uncrossed Back supported Sit quietly and don't talk Place the cuff on your bare arm Adjust snuggly, so that only two fingertips can fit between your skin and the top of the cuff Check 2 readings separated by at least one minute Keep a log of your BP readings For a visual, please reference this diagram: http://ccnc.care/bpdiagram  Provider Name: [facility]     Phone: [phone]  Zone 1: ALL CLEAR  Continue to monitor your symptoms:  BP reading is less than 140 (top number) or less than 90 (bottom number)  No right upper stomach pain No headaches or seeing spots No feeling nauseated or throwing up No swelling in face and hands  Zone 2: CAUTION Call your doctor's office for any of the following:  BP reading is greater than 140 (top number) or greater than 90 (bottom number)  Stomach pain under your ribs in the middle or right side Headaches or seeing spots Feeling nauseated or throwing up Swelling in face and hands  Zone 3: EMERGENCY  Seek immediate medical care if you have any of the following:  BP reading is greater than160 (top number) or greater than 110 (bottom number) Severe headaches not improving with Tylenol Serious difficulty catching your breath Any worsening symptoms from Zone 2  Preterm Labor and Birth Information  The normal length of a pregnancy is 39-41 weeks. Preterm labor is when labor starts before 37 completed weeks of pregnancy. What are the risk factors for preterm labor? Preterm labor is more likely to occur in women who: Have certain infections during pregnancy such as a bladder infection, sexually transmitted infection, or infection inside the uterus (chorioamnionitis). Have a shorter-than-normal cervix. Have gone into preterm labor before. Have had surgery on their cervix. Are younger than age 17  or older than age 35. Are African American. Are pregnant with twins or multiple babies (multiple gestation). Take street drugs or smoke while pregnant. Do not gain enough weight while pregnant. Became pregnant shortly after having been pregnant. What are the symptoms of preterm labor? Symptoms of preterm labor include: Cramps similar to those that can happen during a menstrual period. The cramps may happen with diarrhea. Pain in the abdomen or lower back. Regular uterine contractions that may feel like tightening of the abdomen. A feeling of increased pressure in the pelvis. Increased watery or bloody mucus discharge from the vagina. Water breaking (ruptured amniotic sac). Why is it important to recognize signs of preterm labor? It is important to recognize signs of preterm labor because babies who are born prematurely may not be fully developed. This can put them at an increased risk for: Long-term (chronic) heart and lung problems. Difficulty immediately after birth with regulating body systems, including blood sugar, body temperature, heart rate, and breathing rate. Bleeding in the brain. Cerebral palsy. Learning difficulties. Death. These risks are highest for babies who are born before 34 weeks   of pregnancy. How is preterm labor treated? Treatment depends on the length of your pregnancy, your condition, and the health of your baby. It may involve: Having a stitch (suture) placed in your cervix to prevent your cervix from opening too early (cerclage). Taking or being given medicines, such as: Hormone medicines. These may be given early in pregnancy to help support the pregnancy. Medicine to stop contractions. Medicines to help mature the baby's lungs. These may be prescribed if the risk of delivery is high. Medicines to prevent your baby from developing cerebral palsy. If the labor happens before 34 weeks of pregnancy, you may need to stay in the hospital. What should I do if I  think I am in preterm labor? If you think that you are going into preterm labor, call your health care provider right away. How can I prevent preterm labor in future pregnancies? To increase your chance of having a full-term pregnancy: Do not use any tobacco products, such as cigarettes, chewing tobacco, and e-cigarettes. If you need help quitting, ask your health care provider. Do not use street drugs or medicines that have not been prescribed to you during your pregnancy. Talk with your health care provider before taking any herbal supplements, even if you have been taking them regularly. Make sure you gain a healthy amount of weight during your pregnancy. Watch for infection. If you think that you might have an infection, get it checked right away. Make sure to tell your health care provider if you have gone into preterm labor before. This information is not intended to replace advice given to you by your health care provider. Make sure you discuss any questions you have with your health care provider. Document Revised: 12/17/2018 Document Reviewed: 01/16/2016 Elsevier Patient Education  2020 [employer]   

## 2023-02-17 NOTE — Progress Notes (Signed)
LOW-RISK PREGNANCY VISIT Patient name: Nicole [name] MRN [id number]  Date of birth: Jun 11, 1996 Chief Complaint:   Routine Prenatal Visit (Having some blood when wipe, think hemorroid)  History of Present Illness:   Nicole [name] is a 27 y.o. G38P1011 female at [redacted]w[redacted]d with an Estimated Date of Delivery: 04/08/23 being seen today for ongoing management of a low-risk pregnancy.   Today she reports  bright red rectal bleeding this am, not associated w/ bm. Definitely not vaginal. No pain. Some bloating. BMs have been normal. Has h/o hemorrhoid w/ bleeding prior to pregnancy. . TCS 2021, internal hemorrhoids. Feels like zoloft needs to be increased, was working, now not so much. Denies SI/HI. Did have therapist previously she was paying out of pocket for, now not able to d/t decrease in work hours.  Contractions: Not present.  .  Movement: Present. denies leaking of fluid.     01/08/2023    8:42 AM 09/25/2022    3:33 PM 02/18/2022    2:48 PM 10/24/2019    3:50 PM 09/30/2019   11:36 AM  Depression screen PHQ 2/9  Decreased Interest 2 3 3  0 3  Down, Depressed, Hopeless 2 2 1  0 2  PHQ - 2 Score 4 5 4  0 5  Altered sleeping 2 3 3  3   Tired, decreased energy 1 3 2   0  Change in appetite 1 2 1  3   Feeling bad or failure about yourself  2 2 3  2   Trouble concentrating 2 3 2  3   Moving slowly or fidgety/restless 1 3 3   0  Suicidal thoughts 1 1 1  2   PHQ-9 Score 14 22 19  18   Difficult doing work/chores Somewhat difficult            09/25/2022    3:34 PM 02/18/2022    2:48 PM  GAD 7 : Generalized Anxiety Score  Nervous, Anxious, on Edge 2 3  Control/stop worrying 2 1  Worry too much - different things 3 3  Trouble relaxing 3 3  Restless 2 2  Easily annoyed or irritable 3 3  Afraid - awful might happen 2 2  Total GAD 7 Score 17 17      Review of Systems:   Pertinent items are noted in HPI Denies abnormal vaginal discharge w/ itching/odor/irritation, headaches, visual changes, shortness  of breath, chest pain, abdominal pain, severe nausea/vomiting, or problems with urination or bowel movements unless otherwise stated above. Pertinent History Reviewed:  Reviewed past medical,surgical, social, obstetrical and family history.  Reviewed problem list, medications and allergies. Physical Assessment:   Vitals:   02/17/23 1153  BP: 130/87  Pulse: 95  Weight: 189 lb (85.7 kg)  Body mass index is 29.6 kg/m.        Physical Examination:   General appearance: Well appearing, and in no distress  Mental status: Alert, oriented to person, place, and time  Skin: Warm & dry  Cardiovascular: Normal heart rate noted  Respiratory: Normal respiratory effort, no distress  Abdomen: Soft, gravid, nontender  Pelvic: Cervical exam deferred   Rectal: few small non-thrombosed external hemorrhoids, internal: no masses felt, no blood on exam finger        Extremities: Edema: None  Fetal Status: Fetal Heart Rate (bpm): 142 Fundal Height: 29 cm Movement: Present    Chaperone: Latisha Cresenzo   No results found for this or any previous visit (from the past 24 hour(s)).  Assessment & Plan:  1) Low-risk  pregnancy G3P1011 at [redacted]w[redacted]d with an Estimated Date of Delivery: 04/08/23   2) Rectal bleeding,this am not associated w/ bm; few small external non-thrombosed hemorrhoids, no masses on internal exam; TCS 2021 w/ internal hemorrohids. Rx anusol. If any more rectal bleeding not associated w/ bm, let us know and will refer to GI  3) Dep/anx> requests increase in zoloft, denies SI/HI. Will increase to 75mg  daily. Does not have current therapist (did earlier, but can't afford now). [facility] referral ordered  4) Uterine size<dates- will get EFW/AFI u/s   Meds:  Meds ordered this encounter  Medications   hydrocortisone (ANUSOL-HC) 25 MG suppository    Sig: Place 1 suppository (25 mg total) rectally 2 (two) times daily.    Dispense:  12 suppository    Refill:  0   sertraline (ZOLOFT) 25 MG tablet     Sig: Take 1 tablet (25 mg total) by mouth daily. With 50mg  tablet to equal 75mg  total daily    Dispense:  30 tablet    Refill:  6   Labs/procedures today:  rectal exam  Plan:  Continue routine obstetrical care  Next visit: prefers in person    Reviewed: Preterm labor symptoms and general obstetric precautions including but not limited to vaginal bleeding, contractions, leaking of fluid and fetal movement were reviewed in detail with the patient.  All questions were answered. Does have home bp cuff. Office bp cuff given: not applicable. Check bp weekly, let us know if consistently >140 and/or >90.  Follow-up: Return for ASAP, US:EFW (MFM if needed); 2wks LROB w/ CNM in person.  Future Appointments  Date Time Provider Department Center  03/03/2023  1:50 PM Cheral Marker, CNM [facility]    Orders Placed This Encounter  Procedures   Amb ref to [facility]   Cheral Marker CNM, [facility] 02/17/2023 12:37 PM

## 2023-02-17 NOTE — Addendum Note (Signed)
Addended by: Cheral Marker on: 02/17/2023 02:15 PM   Modules accepted: Orders

## 2023-02-25 ENCOUNTER — Telehealth: Payer: Self-pay | Admitting: Clinical

## 2023-02-25 ENCOUNTER — Ambulatory Visit (INDEPENDENT_AMBULATORY_CARE_PROVIDER_SITE_OTHER): Payer: Medicaid Other

## 2023-02-25 DIAGNOSIS — Z3A34 34 weeks gestation of pregnancy: Secondary | ICD-10-CM | POA: Diagnosis not present

## 2023-02-25 DIAGNOSIS — O26843 Uterine size-date discrepancy, third trimester: Secondary | ICD-10-CM | POA: Diagnosis not present

## 2023-02-25 DIAGNOSIS — Z3483 Encounter for supervision of other normal pregnancy, third trimester: Secondary | ICD-10-CM

## 2023-02-25 DIAGNOSIS — Z302 Encounter for sterilization: Secondary | ICD-10-CM

## 2023-02-25 DIAGNOSIS — Z3480 Encounter for supervision of other normal pregnancy, unspecified trimester: Secondary | ICD-10-CM

## 2023-02-25 NOTE — Telephone Encounter (Signed)
Attempt call regarding referral; Left HIPPA-compliant message to call back Vlad Mayberry from [facility] at  [phone] (Julya Alioto's office).   

## 2023-02-25 NOTE — Progress Notes (Signed)
[place] 34 wks,cephalic,posterior placenta gr 2,AFI 16 cm,FHR 157 bpm,EFW 2334 g 45%

## 2023-03-03 ENCOUNTER — Encounter: Payer: Medicaid Other | Admitting: Women's Health

## 2023-03-05 ENCOUNTER — Telehealth: Payer: Self-pay | Admitting: *Deleted

## 2023-03-05 ENCOUNTER — Ambulatory Visit (INDEPENDENT_AMBULATORY_CARE_PROVIDER_SITE_OTHER): Payer: Medicaid Other | Admitting: *Deleted

## 2023-03-05 DIAGNOSIS — Z0130 Encounter for examination of blood pressure without abnormal findings: Secondary | ICD-10-CM

## 2023-03-05 NOTE — Progress Notes (Addendum)
   NURSE VISIT- BLOOD PRESSURE CHECK  SUBJECTIVE:  Nicole [name] is a 27 y.o. G84P1011 female here for BP check. She is [redacted]w[redacted]d pregnant    HYPERTENSION ROS:  Pregnant/postpartum:  Severe headaches that don't go away with tylenol/other medicines: No  Visual changes (seeing spots/double/blurred vision) No  Severe pain under right breast breast or in center of upper chest No  Severe nausea/vomiting No  Taking medicines as instructed no    OBJECTIVE:  BP 122/84   Pulse 86   LMP 07/02/2022 (Exact Date)   With patients cuff from home BP was 108/78. Appearance alert, well appearing, and in no distress.  Pt was brought in for BP check because she reported readings at home of 120's over 90's. In office bp was good on both cuffs. Advised to rest before checking blood pressure and if she got an elevated reading to wait a while before rechecking. Aware to let us know if she gets readings greater that 140/90.   ASSESSMENT: Pregnancy [redacted]w[redacted]d  blood pressure check  PLAN: Discussed with Nicole [name], CNM   Recommendations: no changes needed   Follow-up: as scheduled   Nicole [name]  03/05/2023 2:13 PM

## 2023-03-05 NOTE — Telephone Encounter (Signed)
Called patient back, her bp 133/98, repeat 128/98. She was feeling anxious and short of breath. Pt to come in with her bp cuff to let us check this afternoon.

## 2023-03-05 NOTE — Telephone Encounter (Signed)
Pt states her blood pressure is high and would like a call from a nurse.

## 2023-03-19 ENCOUNTER — Other Ambulatory Visit (HOSPITAL_COMMUNITY)
Admission: RE | Admit: 2023-03-19 | Discharge: 2023-03-19 | Disposition: A | Discharge status: Home / Self Care | Payer: Medicaid Other | Source: Ambulatory Visit | Attending: Advanced Practice Midwife | Admitting: Advanced Practice Midwife

## 2023-03-19 ENCOUNTER — Ambulatory Visit (INDEPENDENT_AMBULATORY_CARE_PROVIDER_SITE_OTHER): Payer: Medicaid Other | Admitting: Advanced Practice Midwife

## 2023-03-19 VITALS — BP 121/80 | HR 113 | Wt 194.0 lb

## 2023-03-19 DIAGNOSIS — Z3480 Encounter for supervision of other normal pregnancy, unspecified trimester: Secondary | ICD-10-CM | POA: Insufficient documentation

## 2023-03-19 DIAGNOSIS — Z3A37 37 weeks gestation of pregnancy: Secondary | ICD-10-CM | POA: Insufficient documentation

## 2023-03-19 NOTE — Progress Notes (Signed)
   LOW-RISK PREGNANCY VISIT Patient name: Nicole [name] MRN [id number]  Date of birth: August 10, 1996 Chief Complaint:   Routine Prenatal Visit and Vaginal Discharge Chilton Si, denies odor/itching/burning)  History of Present Illness:   Nicole [name] is a 28 y.o. G15P1011 female at [redacted]w[redacted]d with an Estimated Date of Delivery: 04/08/23 being seen today for ongoing management of a low-risk pregnancy.  Today she reports normal pregnancy complaints, Probably going to go out of work soon.  Contractions: Irregular (cramps).  .  Movement: Present. denies leaking of fluid. Review of Systems:   Pertinent items are noted in HPI Denies abnormal vaginal discharge w/ itching/odor/irritation, headaches, visual changes, shortness of breath, chest pain, abdominal pain, severe nausea/vomiting, or problems with urination or bowel movements unless otherwise stated above. Pertinent History Reviewed:  Reviewed past medical,surgical, social, obstetrical and family history.  Reviewed problem list, medications and allergies. Physical Assessment:   Vitals:   03/19/23 1351  BP: 121/80  Pulse: (!) 113  Weight: 194 lb (88 kg)  Body mass index is 30.38 kg/m.        Physical Examination:   General appearance: Well appearing, and in no distress  Mental status: Alert, oriented to person, place, and time  Skin: Warm & dry  Cardiovascular: Normal heart rate noted  Respiratory: Normal respiratory effort, no distress  Abdomen: Soft, gravid, nontender  Pelvic: Cervical exam performed         Extremities: Edema: Trace  Fetal Status:     Movement: Present    Chaperone:   Sheena, CMA     No results found for this or any previous visit (from the past 24 hour(s)).  Assessment & Plan:    Pregnancy: G3P1011 at [redacted]w[redacted]d 1. Supervision of other normal pregnancy, antepartum  - Cervicovaginal ancillary only - Culture, beta strep (group b only)  2. [redacted] weeks gestation of pregnancy  - Cervicovaginal ancillary only - Culture,  beta strep (group b only)     Meds: No orders of the defined types were placed in this encounter.  Labs/procedures today: GBS, GC, CHL  Plan:  Continue routine obstetrical care  Next visit: prefers in person    Reviewed: Term labor symptoms and general obstetric precautions including but not limited to vaginal bleeding, contractions, leaking of fluid and fetal movement were reviewed in detail with the patient.  All questions were answered. Has home bp cuff.. Check bp weekly, let us know if >140/90.   Follow-up: Return weekly LROB X 3.  No future appointments.  Orders Placed This Encounter  Procedures   Culture, beta strep (group b only)   Jacklyn Shell DNP, CNM 03/19/2023 1:59 PM

## 2023-03-19 NOTE — Patient Instructions (Addendum)
AM I IN LABOR? What is labor? Labor is the work that your body does to birth your baby. Your uterus (the womb) contracts. Your cervix (the mouth of the uterus) opens. You will push your baby out into the world.  What do contractions (labor pains) feel like? When they first start, contractions usually feel like cramps during your period. Sometimes you feel pain in your back. Most often, contractions feel like muscles pulling painfully in your lower belly. At first, the contractions will probably be 15 to 20 minutes apart. They will not feel too painful. As labor goes on, the contractions get stronger, closer together, and more painful.  How do I time the contractions? Time your contractions by counting the number of minutes from the start of one contraction to the start of the next contraction.  What should I do when the contractions start? If it is night and you can sleep, sleep. If it happens during the day, here are some things you can do to take care of yourself at home: ? Walk. If the pains you are having are real labor, walking will make the contractions come faster and harder. If the contractions are not going to continue and be real labor, walking will make the contractions slow down. ? Take a shower or bath. This will help you relax. ? Eat. Labor is a big event. It takes a lot of energy. ? Drink water. Not drinking enough water can cause false labor (contractions that hurt but do not open your cervix). If this is true labor, drinking water will help you have strength to get through your labor. ? Take a nap. Get all the rest you can. ? Get a massage. If your labor is in your back, a strong massage on your lower back may feel very good. Getting a foot massage is always good. ? Don't panic. You can do this. Your body was made for this. You are strong!  When should I go to the hospital or call my health care provider? ? Your contractions have been 5 minutes apart or less for at least 1  hour. ? If several contractions are so painful you cannot walk or talk during one. ? Your bag of waters breaks. (You may have a big gush of water or just water that runs down your legs when you walk.)  Are there other reasons to call my health care provider? Yes, you should call your health care provider or go to the hospital if you start to bleed like you are having a period- blood that soaks your underwear or runs down your legs, if you have sudden severe pain, if your baby has not moved for several hours, or if you are leaking green fluid. The rule is as follows: If you are very concerned about something, call.   Kinesiology taping for pregnancy:  Youtube has good vidoes of "how tos" for lower back, pelvic, hip pain; swelling of feet, etc  

## 2023-03-23 LAB — CULTURE, BETA STREP (GROUP B ONLY): Strep Gp B Culture: NEGATIVE

## 2023-03-23 LAB — CERVICOVAGINAL ANCILLARY ONLY
Bacterial Vaginitis (gardnerella): POSITIVE — AB
Candida Glabrata: NEGATIVE
Candida Vaginitis: POSITIVE — AB
Chlamydia: NEGATIVE
Comment: NEGATIVE
Comment: NEGATIVE
Comment: NEGATIVE
Comment: NEGATIVE
Comment: NEGATIVE
Comment: NORMAL
Neisseria Gonorrhea: NEGATIVE
Trichomonas: NEGATIVE

## 2023-03-26 ENCOUNTER — Inpatient Hospital Stay (HOSPITAL_COMMUNITY): Payer: Medicaid Other

## 2023-03-26 ENCOUNTER — Ambulatory Visit: Payer: Medicaid Other | Admitting: Advanced Practice Midwife

## 2023-03-26 ENCOUNTER — Encounter: Payer: Self-pay | Admitting: Advanced Practice Midwife

## 2023-03-26 ENCOUNTER — Inpatient Hospital Stay (HOSPITAL_COMMUNITY)
Admission: AD | Admit: 2023-03-26 | Discharge: 2023-03-27 | Disposition: A | Discharge status: Home / Self Care | Payer: Medicaid Other | Attending: Obstetrics & Gynecology | Admitting: Obstetrics & Gynecology

## 2023-03-26 ENCOUNTER — Encounter (HOSPITAL_COMMUNITY): Payer: Self-pay | Admitting: Obstetrics & Gynecology

## 2023-03-26 VITALS — BP 122/87 | HR 103 | Wt 192.0 lb

## 2023-03-26 DIAGNOSIS — O26893 Other specified pregnancy related conditions, third trimester: Secondary | ICD-10-CM | POA: Insufficient documentation

## 2023-03-26 DIAGNOSIS — O288 Other abnormal findings on antenatal screening of mother: Secondary | ICD-10-CM

## 2023-03-26 DIAGNOSIS — O09293 Supervision of pregnancy with other poor reproductive or obstetric history, third trimester: Secondary | ICD-10-CM | POA: Insufficient documentation

## 2023-03-26 DIAGNOSIS — Z3480 Encounter for supervision of other normal pregnancy, unspecified trimester: Secondary | ICD-10-CM

## 2023-03-26 DIAGNOSIS — Z302 Encounter for sterilization: Secondary | ICD-10-CM

## 2023-03-26 DIAGNOSIS — Z3A38 38 weeks gestation of pregnancy: Secondary | ICD-10-CM | POA: Diagnosis not present

## 2023-03-26 DIAGNOSIS — Z3483 Encounter for supervision of other normal pregnancy, third trimester: Secondary | ICD-10-CM

## 2023-03-26 DIAGNOSIS — R109 Unspecified abdominal pain: Secondary | ICD-10-CM | POA: Insufficient documentation

## 2023-03-26 LAB — URINALYSIS, ROUTINE W REFLEX MICROSCOPIC
Bilirubin Urine: NEGATIVE
Glucose, UA: NEGATIVE mg/dL
Hgb urine dipstick: NEGATIVE
Ketones, ur: NEGATIVE mg/dL
Nitrite: NEGATIVE
Protein, ur: NEGATIVE mg/dL
Specific Gravity, Urine: 1.015 (ref 1.005–1.030)
pH: 6 (ref 5.0–8.0)

## 2023-03-26 LAB — COMPREHENSIVE METABOLIC PANEL
ALT: 21 U/L (ref 0–44)
AST: 24 U/L (ref 15–41)
Albumin: 2.7 g/dL — ABNORMAL LOW (ref 3.5–5.0)
Alkaline Phosphatase: 191 U/L — ABNORMAL HIGH (ref 38–126)
Anion gap: 8 (ref 5–15)
BUN: 7 mg/dL (ref 6–20)
CO2: 22 mmol/L (ref 22–32)
Calcium: 8.5 mg/dL — ABNORMAL LOW (ref 8.9–10.3)
Chloride: 105 mmol/L (ref 98–111)
Creatinine, Ser: 0.93 mg/dL (ref 0.44–1.00)
GFR, Estimated: >60 mL/min (ref >60)
Glucose, Bld: 84 mg/dL (ref 70–99)
Potassium: 3.9 mmol/L (ref 3.5–5.1)
Sodium: 135 mmol/L (ref 135–145)
Total Bilirubin: 0.4 mg/dL (ref 0.3–1.2)
Total Protein: 5.8 g/dL — ABNORMAL LOW (ref 6.5–8.1)

## 2023-03-26 LAB — POCT URINALYSIS DIPSTICK OB
Blood, UA: NEGATIVE
Glucose, UA: NEGATIVE
Ketones, UA: NEGATIVE
Nitrite, UA: NEGATIVE
POC,PROTEIN,UA: NEGATIVE

## 2023-03-26 LAB — CBC
HCT: 30.9 % — ABNORMAL LOW (ref 36.0–46.0)
Hemoglobin: 9.8 g/dL — ABNORMAL LOW (ref 12.0–15.0)
MCH: 26.6 pg (ref 26.0–34.0)
MCHC: 31.7 g/dL (ref 30.0–36.0)
MCV: 83.7 fL (ref 80.0–100.0)
Platelets: 177 10*3/uL (ref 150–400)
RBC: 3.69 MIL/uL — ABNORMAL LOW (ref 3.87–5.11)
RDW: 13.3 % (ref 11.5–15.5)
WBC: 11.8 10*3/uL — ABNORMAL HIGH (ref 4.0–10.5)
nRBC: 0 % (ref 0.0–0.2)

## 2023-03-26 LAB — PROTEIN / CREATININE RATIO, URINE
Creatinine, Urine: 106 mg/dL
Protein Creatinine Ratio: 0.13 mg/mg{Cre} (ref 0.00–0.15)
Total Protein, Urine: 14 mg/dL

## 2023-03-26 MED ORDER — SERTRALINE HCL 25 MG PO TABS: 25.0000 mg | ORAL_TABLET | Freq: Every day | ORAL | 6 refills | Status: AC

## 2023-03-26 MED ORDER — LIDOCAINE 5 % EX PTCH
1.0000 | MEDICATED_PATCH | Freq: Once | CUTANEOUS | Status: DC
  Administered 2023-03-26: 1 via TRANSDERMAL
  Filled 2023-03-26: qty 1

## 2023-03-26 MED ORDER — SERTRALINE HCL 50 MG PO TABS: 50.0000 mg | ORAL_TABLET | Freq: Every day | ORAL | 3 refills | Status: DC

## 2023-03-26 MED ORDER — CYCLOBENZAPRINE HCL 10 MG PO TABS: 10.0000 mg | ORAL_TABLET | Freq: Three times a day (TID) | ORAL | 1 refills | Status: DC | PRN

## 2023-03-26 MED ORDER — TAMSULOSIN HCL 0.4 MG PO CAPS: 0.4000 mg | ORAL_CAPSULE | Freq: Every day | ORAL | 0 refills | Status: DC

## 2023-03-26 MED ORDER — OXYCODONE HCL 5 MG PO TABS: 5.0000 mg | ORAL_TABLET | ORAL | 0 refills | Status: DC | PRN

## 2023-03-26 NOTE — Progress Notes (Signed)
PT to [place]

## 2023-03-26 NOTE — Progress Notes (Signed)
LOW-RISK PREGNANCY VISIT Patient name: Nicole [name] MRN [id number]  Date of birth: 1995-09-22 Chief Complaint:   Routine Prenatal Visit (Pain right upper rib cage area. Need refill Zoloft. Cervix check)  History of Present Illness:   Nicole [name] is a 27 y.o. G63P1011 female at [redacted]w[redacted]d with an Estimated Date of Delivery: 04/08/23 being seen today for ongoing management of a low-risk pregnancy.   Just prior to walking into room, pt coughed and had an immediate and dramatic increase in flank pain on right side.  Has been having it intermittently for 2-3 days, mainly in back/side "under my ribs".  Seems likely a muscle spasm, but no trigger points and no tenderness anywhere along back/spine.  Dr. Despina Hidden in to assess, seems likely a kidney stone.  D/T pts advance GA, will treat as one w/o imaging.   Review of Systems:   Pertinent items are noted in HPI Denies abnormal vaginal discharge w/ itching/odor/irritation, headaches, visual changes, shortness of breath, chest pain, abdominal pain, severe nausea/vomiting, or problems with urination or bowel movements unless otherwise stated above. Pertinent History Reviewed:  Reviewed past medical,surgical, social, obstetrical and family history.  Reviewed problem list, medications and allergies. Physical Assessment:   Vitals:   03/26/23 1437 03/26/23 1539  BP: (!) 132/90 122/87  Pulse: (!) 109 (!) 103  Weight: 192 lb (87.1 kg)   Body mass index is 30.07 kg/m.        Physical Examination:   General appearance: Well appearing, and in no distress  Mental status: Alert, oriented to person, place, and time  Skin: Warm & dry  Cardiovascular: Normal heart rate noted  Respiratory: Normal respiratory effort, no distress  Abdomen: Soft, gravid, nontender  Pelvic: Cervical exam deferred         Extremities: Edema: None  Fetal Status:     Movement: Present    Chaperone:  N/A     Results for orders placed or performed in visit on 03/26/23 (from the  past 24 hour(s))  POC Urinalysis Dipstick OB   Collection Time: 03/26/23  3:27 PM  Result Value Ref Range   Color, UA     Clarity, UA     Glucose, UA Negative Negative   Bilirubin, UA     Ketones, UA neg    Spec Grav, UA     Blood, UA neg    pH, UA     POC,PROTEIN,UA Negative Negative, Trace, Small (1+), Moderate (2+), Large (3+), 4+   Urobilinogen, UA     Nitrite, UA neg    Leukocytes, UA Small (1+) (A) Negative   Appearance     Odor      Assessment & Plan:    Pregnancy: G3P1011 at [redacted]w[redacted]d 1. Supervision of other normal pregnancy, antepartum   2. [redacted] weeks gestation of pregnancy  3>  Right flank pain, colicky and severe today.  If meds don;t help (give them about an hour) then to to MAU for further evaluation/pain contril     Meds:  Meds ordered this encounter  Medications   tamsulosin (FLOMAX) 0.4 MG CAPS capsule    Sig: Take 1 capsule (0.4 mg total) by mouth daily.    Dispense:  30 capsule    Refill:  0    Order Specific Question:   Supervising Provider    Answer:   Despina Hidden, LUTHER H [[id number]]   oxyCODONE (ROXICODONE) 5 MG immediate release tablet    Sig: Take 1 tablet (5 mg total) by mouth every 4 (  four) hours as needed for severe pain.    Dispense:  30 tablet    Refill:  0    Order Specific Question:   Supervising Provider    Answer:   Duane Lope H [[id number]]   cyclobenzaprine (FLEXERIL) 10 MG tablet    Sig: Take 1 tablet (10 mg total) by mouth every 8 (eight) hours as needed for muscle spasms.    Dispense:  30 tablet    Refill:  1    Order Specific Question:   Supervising Provider    Answer:   Lazaro Arms [[id number]]    Plan:  Continue routine obstetrical care  Next visit: prefers in person    Reviewed: Term labor symptoms and general obstetric precautions including but not limited to vaginal bleeding, contractions, leaking of fluid and fetal movement were reviewed in detail with the patient.  All questions were answered. Has home bp cuff.. Check bp daily, let us  know if >140/90.   Follow-up: Return for phone BP check w/RN on Monday.  Future Appointments  Date Time Provider Department Center  03/30/2023  2:30 PM [facility] NURSE [facility]  04/02/2023  1:30 PM Jacklyn Shell, CNM [facility]  04/08/2023  2:10 PM Arabella Merles, CNM [facility]    Orders Placed This Encounter  Procedures   POC Urinalysis Dipstick OB   Jacklyn Shell DNP, CNM 03/26/2023 6:28 PM

## 2023-03-26 NOTE — MAU Provider Note (Signed)
Chief Complaint:  Back Pain   Event Date/Time   First Provider Initiated Contact with Patient 03/26/23 2108     HPI: Nicole [name] is a 27 y.o. G3P1011 at 59w1dwho presents to maternity admissions reporting right flank pain.  Had a "popping" sensation with coughing today while at office.  Was prescribed Percocet and Flexeril which have not helped. They also g. She reports good fetal movement, denies LOF, vaginal bleeding, vaginal itching/burning, urinary symptoms, h/a, dizziness, n/v, diarrhea, constipation or fever/chills.  She denies headache, visual changes or RUQ abdominal pain.  Back Pain This is a new problem. The current episode started today. The problem occurs constantly. The pain does not radiate. The symptoms are aggravated by bending, position and twisting. Pertinent negatives include no abdominal pain, chest pain, dysuria or fever. She has tried analgesics for the symptoms. The treatment provided no relief.   RN Note: Nicole [name] is a 27 y.o. at [redacted]w[redacted]d here in MAU reporting R flank pain since the wkend. At 1500 today pt was at [facility] office and coughed and R flank pain worsened. States she felt a "pop" with the cough. Pt goes to [facility]. The provider thought could be muscular and gave pt script for narcotic and muscle relaxer and one other med but they are not helping. Reports good FM and denies LOF or VB. Pain is constant and dull but when she coughs or laughs pain greatly increases.    Onset of complaint: past few day                                         Pain score: 7  Past Medical History: Past Medical History:  Diagnosis Date   ADD (attention deficit disorder)    BV (bacterial vaginosis) 03/18/2013   Treat ed with flagyl   Contraceptive management 05/29/2015   Depression    Encounter for insertion of mirena IUD 07/11/2016   07/11/16    Encounter for management of intrauterine contraceptive device (IUD) 08/20/2016   Encounter for surveillance of contraceptive pills  08/30/2018   History of prior pregnancy with IUGR newborn 06/09/2016   Irregular bleeding 08/08/2016   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   02/24/22 Pap is +HPV and shows LSIL,low grade lesion, needs colpo per ASCCP guidelines, immediate risk CIN 3+ is 5 %     Miscarriage    Supervision of normal pregnancy in first trimester 10/05/2015    [facility]  Initiated Care at   9+6 weeks  FOB  Dorian Heckle 27 yo WM  Dating By  LMP and [place]  Pap  10/05/15 negative +yeast  GC/CT Initial:     -/-           36+wks:  Genetic Screen AFP  CF screen   1 variant detected ; FOB declines testing  Anatomic [place]   Flu vaccine  10/05/15  Tdap Recommended ~ 28wks  Glucose Screen  2 hr  GBS   Feed Preference   Contraception   Circumcision   Childbirth Classes   Pediatrician       Vertigo     Past obstetric history: OB History  Gravida Para Term Preterm AB Living  3 1 1  0 1 1  SAB IAB Ectopic Multiple Live Births  1 0 0 0 1    # Outcome Date GA Lbr Len/2nd Weight Sex Type Anes PTL  Lv  3 Current           2 SAB 2023          1 Term 04/29/16 [redacted]w[redacted]d 14:05 / 01:25 3100 g F Vag-Spont EPI N LIV     Birth Comments: None noted    Past Surgical History: Past Surgical History:  Procedure Laterality Date   COLONOSCOPY     DILATION AND EVACUATION N/A 12/27/2021   Procedure: SUCTION DILATATION AND EVACUATION;  Surgeon: Warden Fillers, MD;  Location: [facility] OR;  Service: Gynecology;  Laterality: N/A;    Family History: Family History  Problem Relation Age of Onset   Deafness Mother    Arthritis Mother    Hyperlipidemia Mother    Heart attack Father    Deafness Father    Glaucoma Maternal Grandmother    Hypertension Maternal Grandmother    Anxiety disorder Maternal Grandmother    COPD Maternal Grandfather    GER disease Maternal Grandfather    Dementia Paternal Grandmother    Colon cancer Neg Hx    Pancreatic cancer Neg Hx    Stomach cancer Neg Hx    Esophageal cancer Neg Hx    Liver disease Neg Hx      Social History: Social History   Tobacco Use   Smoking status: Former    Current packs/day: 0.25    Average packs/day: 0.3 packs/day for 0.2 years (0.1 ttl pk-yrs)    Types: Cigarettes   Smokeless tobacco: Never  Vaping Use   Vaping status: Every Day  Substance Use Topics   Alcohol use: Not Currently    Comment: occ   Drug use: No    Allergies: No Known Allergies  Meds:  Medications Prior to Admission  Medication Sig Dispense Refill Last Dose   acetaminophen (TYLENOL) 500 MG tablet Take 1,000 mg by mouth 2 (two) times daily as needed (pain).   03/26/2023 at 1415   amphetamine-dextroamphetamine (ADDERALL) 30 MG tablet Take 15 mg by mouth daily.   03/26/2023   cyclobenzaprine (FLEXERIL) 10 MG tablet Take 1 tablet (10 mg total) by mouth every 8 (eight) hours as needed for muscle spasms. 30 tablet 1 03/26/2023 at 1730   ibuprofen (ADVIL) 600 MG tablet Take 600 mg by mouth every 6 (six) hours as needed.   03/25/2023   oxyCODONE (ROXICODONE) 5 MG immediate release tablet Take 1 tablet (5 mg total) by mouth every 4 (four) hours as needed for severe pain. 30 tablet 0 03/26/2023 at 2000   Prenatal Vit-Fe Fumarate-FA (PRENATAL VITAMIN PO) Take by mouth.   03/26/2023   sertraline (ZOLOFT) 50 MG tablet Take 1 tablet (50 mg total) by mouth daily. 30 tablet 3 03/24/2023   tamsulosin (FLOMAX) 0.4 MG CAPS capsule Take 1 capsule (0.4 mg total) by mouth daily. 30 capsule 0 03/26/2023 at 1730   Blood Pressure Monitor MISC For regular home bp monitoring during pregnancy 1 each 0    hydrocortisone (ANUSOL-HC) 25 MG suppository Place 1 suppository (25 mg total) rectally 2 (two) times daily. (Patient not taking: Reported on 03/26/2023) 12 suppository 0    sertraline (ZOLOFT) 25 MG tablet Take 1 tablet (25 mg total) by mouth daily. With 50mg  tablet to equal 75mg  total daily 30 tablet 6     I have reviewed patient's Past Medical Hx, Surgical Hx, Family Hx, Social Hx, medications and allergies.   ROS:   Review of Systems  Constitutional:  Negative for fever.  Cardiovascular:  Negative for chest pain.  Gastrointestinal:  Negative for abdominal pain.  Genitourinary:  Negative for dysuria.  Musculoskeletal:  Positive for back pain.   Other systems negative  Physical Exam  Patient Vitals for the past 24 hrs:  BP Temp Pulse Resp SpO2 Height Weight  03/26/23 2100 130/83 -- 91 -- 99 % -- --  03/26/23 2045 130/87 -- -- -- -- -- --  03/26/23 2041 -- (!) 97.4 F (36.3 C) 97 18 100 % 5\' 7"  (1.702 m) 87.1 kg   Constitutional: Well-developed, well-nourished female in no acute distress.  Cardiovascular: normal rate  Respiratory: normal effort,  Tender over right flank about 11-12 ICS. GI: Abd soft, non-tender, gravid appropriate for gestational age.   No rebound or guarding. MS: Extremities nontender, no edema, normal ROM Neurologic: Alert and oriented x 4.  GU: Neg CVAT.  FHT:  Baseline 150 , moderate variability, small accelerations present, no decelerations   Reassuring but not reactive by criteria Contractions:   Rare   Labs: Results for orders placed or performed during the hospital encounter of 03/26/23 (from the past 24 hour(s))  CBC     Status: Abnormal   Collection Time: 03/26/23  8:30 PM  Result Value Ref Range   WBC 11.8 (H) 4.0 - 10.5 K/uL   RBC 3.69 (L) 3.87 - 5.11 MIL/uL   Hemoglobin 9.8 (L) 12.0 - 15.0 g/dL   HCT 41.3 (L) 24.4 - 01.0 %   MCV 83.7 80.0 - 100.0 fL   MCH 26.6 26.0 - 34.0 pg   MCHC 31.7 30.0 - 36.0 g/dL   RDW 27.2 53.6 - 64.4 %   Platelets 177 150 - 400 K/uL   nRBC 0.0 0.0 - 0.2 %  Comprehensive metabolic panel     Status: Abnormal   Collection Time: 03/26/23  8:30 PM  Result Value Ref Range   Sodium 135 135 - 145 mmol/L   Potassium 3.9 3.5 - 5.1 mmol/L   Chloride 105 98 - 111 mmol/L   CO2 22 22 - 32 mmol/L   Glucose, Bld 84 70 - 99 mg/dL   BUN 7 6 - 20 mg/dL   Creatinine, Ser 0.34 0.44 - 1.00 mg/dL   Calcium 8.5 (L) 8.9 - 10.3 mg/dL   Total  Protein 5.8 (L) 6.5 - 8.1 g/dL   Albumin 2.7 (L) 3.5 - 5.0 g/dL   AST 24 15 - 41 U/L   ALT 21 0 - 44 U/L   Alkaline Phosphatase 191 (H) 38 - 126 U/L   Total Bilirubin 0.4 0.3 - 1.2 mg/dL   GFR, Estimated >74 >25 mL/min   Anion gap 8 5 - 15  Protein / creatinine ratio, urine     Status: None   Collection Time: 03/26/23  8:38 PM  Result Value Ref Range   Creatinine, Urine 106 mg/dL   Total Protein, Urine 14 mg/dL   Protein Creatinine Ratio 0.13 0.00 - 0.15 mg/mg[Cre]  Urinalysis, Routine w reflex microscopic -Urine, Clean Catch     Status: Abnormal   Collection Time: 03/26/23  8:38 PM  Result Value Ref Range   Color, Urine YELLOW YELLOW   APPearance HAZY (A) CLEAR   Specific Gravity, Urine 1.015 1.005 - 1.030   pH 6.0 5.0 - 8.0   Glucose, UA NEGATIVE NEGATIVE mg/dL   Hgb urine dipstick NEGATIVE NEGATIVE   Bilirubin Urine NEGATIVE NEGATIVE   Ketones, ur NEGATIVE NEGATIVE mg/dL   Protein, ur NEGATIVE NEGATIVE mg/dL   Nitrite NEGATIVE NEGATIVE   Leukocytes,Ua LARGE (A) NEGATIVE  RBC / HPF 0-5 0 - 5 RBC/hpf   WBC, UA 11-20 0 - 5 WBC/hpf   Bacteria, UA RARE (A) NONE SEEN   Squamous Epithelial / HPF 11-20 0 - 5 /HPF   Mucus PRESENT     A/Positive/-- (01/18 1609)  Imaging:  US Renal  Result Date: 03/26/2023 CLINICAL DATA:  Right flank pain.  Thirty-eight weeks pregnant. EXAM: RENAL / URINARY TRACT ULTRASOUND COMPLETE COMPARISON:  None Available. FINDINGS: Right Kidney: Renal measurements: 11.3 x 5.7 x 5.5 cm = volume: 188.2 mL. Echogenicity within normal limits. There is moderate hydronephrosis. Left Kidney: Renal measurements: 11.5 x 5.8 x 4.9 cm = volume: 168.0 mL. Echogenicity within normal limits. No mass or hydronephrosis visualized. Bladder: Appears normal for degree of bladder distention. Other: None. IMPRESSION: Moderate hydronephrosis on the right, may be associated with maternal hydronephrosis in pregnancy. Electronically Signed   By: Thornell Sartorius M.D.   On: 03/26/2023  22:07    BPP 6/8 (2 off for breathing, did have them but not sustained)  MAU Course/MDM: I have reviewed the triage vital signs and the nursing notes.   Pertinent labs & imaging results that were available during my care of the patient were reviewed by me and considered in my medical decision making (see chart for details).      I have reviewed her medical records including past results, notes and treatments.   I have ordered labs and reviewed results. These were normal  NST reviewed Renal US showed mod Hydronephrosis, unclear if due to stone or uterus NST was nonreactive. Likely due to narcotics BPP done for reassurance, 2 off for breathing  Consult Dr Charlotta Newton with presentation, exam findings and test results. She recomemends repeat BPP in office in AM, message sent   Assessment: Single IUP at [redacted]w[redacted]d Right flank pain Equivocal NST/BPP, overall reassuring  Plan: Discharge home Continue meds as prescribed  Labor precautions and fetal kick counts Follow up in Office for prenatal BPP In AM Encouraged to return if she develops worsening of symptoms, increase in pain, fever, or other concerning symptoms.    Pt stable at time of discharge.  Wynelle Bourgeois CNM, MSN Certified Nurse-Midwife 03/26/2023 9:08 PM

## 2023-03-26 NOTE — MAU Note (Signed)
.  Nicole [name] is a 27 y.o. at [redacted]w[redacted]d here in MAU reporting R flank pain since the wkend. At 1500 today pt was at [facility] office and coughed and R flank pain worsened. States she felt a "pop" with the cough. Pt goes to [facility]. The provider thought could be muscular and gave pt script for narcotic and muscle relaxer and one other med but they are not helping. Reports good FM and denies LOF or VB. Pain is constant and dull but when she coughs or laughs pain greatly increases.    Onset of complaint: past few day Pain score: 7 Vitals:   03/26/23 2041 03/26/23 2045  BP:  130/87  Pulse: 97   Resp: 18   Temp: (!) 97.4 F (36.3 C)   SpO2: 100%      FHT:139 Lab orders placed from triage:  u/a

## 2023-03-27 ENCOUNTER — Encounter: Payer: Medicaid Other | Admitting: Obstetrics and Gynecology

## 2023-03-27 DIAGNOSIS — R109 Unspecified abdominal pain: Secondary | ICD-10-CM

## 2023-03-27 DIAGNOSIS — O288 Other abnormal findings on antenatal screening of mother: Secondary | ICD-10-CM

## 2023-03-27 DIAGNOSIS — Z3A38 38 weeks gestation of pregnancy: Secondary | ICD-10-CM

## 2023-03-28 LAB — CULTURE, OB URINE

## 2023-03-30 ENCOUNTER — Other Ambulatory Visit (HOSPITAL_COMMUNITY): Payer: Self-pay | Admitting: Advanced Practice Midwife

## 2023-03-30 ENCOUNTER — Telehealth (INDEPENDENT_AMBULATORY_CARE_PROVIDER_SITE_OTHER): Payer: Medicaid Other | Admitting: *Deleted

## 2023-03-30 VITALS — BP 131/92 | HR 120

## 2023-03-30 DIAGNOSIS — O133 Gestational [pregnancy-induced] hypertension without significant proteinuria, third trimester: Secondary | ICD-10-CM

## 2023-03-30 DIAGNOSIS — Z3480 Encounter for supervision of other normal pregnancy, unspecified trimester: Secondary | ICD-10-CM

## 2023-03-30 DIAGNOSIS — Z0130 Encounter for examination of blood pressure without abnormal findings: Secondary | ICD-10-CM

## 2023-03-30 NOTE — H&P (Deleted)
  The note originally documented on this encounter has been moved the the encounter in which it belongs.  

## 2023-03-30 NOTE — Progress Notes (Signed)
   NURSE VISIT- BLOOD PRESSURE CHECK  SUBJECTIVE:  Nicole [name] is a 27 y.o. G80P1011 female here for BP check. She is [redacted]w[redacted]d pregnant    HYPERTENSION ROS:  Pregnant/postpartum:  Severe headaches that don't go away with tylenol/other medicines: No  Visual changes (seeing spots/double/blurred vision) No  Severe pain under right breast breast or in center of upper chest No  Severe nausea/vomiting No  Taking medicines as instructed not applicable    OBJECTIVE:  BP (!) 132/91   Pulse (!) 130   LMP 07/02/2022 (Exact Date)   Appearance alert, well appearing, and in no distress.  ASSESSMENT: Pregnancy [redacted]w[redacted]d  blood pressure check  PLAN: Discussed with Nicole [name], CNM   Recommendations:  pt will be scheduled for induction    Follow-up:  pt will be notified of induction by Nicole [name].     Annamarie Dawley  03/30/2023 2:47 PM

## 2023-03-30 NOTE — H&P (Signed)
Nicole [name] is a 27 y.o. female G3P1011 with IUP at [redacted]w[redacted]d presenting for IOL for GHTN, diagnosed today. [facility] at [facility]  Prenatal History/Complications:  Term SVD w/o problems Missed AB  w/D&C.  Severe flank pain this past week, ? 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   02/24/22 Pap is +HPV and shows LSIL,low grade lesion, needs colpo per ASCCP guidelines, immediate risk CIN 3+ is 5 %     Miscarriage    Supervision of normal pregnancy in first trimester 10/05/2015    [facility]  Initiated Care at   9+6 weeks  FOB  Dorian Heckle 27 yo WM  Dating By  LMP and [place]  Pap  10/05/15 negative +yeast  GC/CT Initial:     -/-           36+wks:  Genetic Screen AFP  CF screen   1 variant detected ; FOB declines testing  Anatomic [place]   Flu vaccine  10/05/15  Tdap Recommended ~ 28wks  Glucose Screen  2 hr  GBS   Feed Preference   Contraception   Circumcision   Childbirth Classes   Pediatrician       Vertigo     Past Surgical History: Past Surgical History:  Procedure Laterality Date   COLONOSCOPY     DILATION AND EVACUATION N/A 12/27/2021   Procedure: SUCTION DILATATION AND EVACUATION;  Surgeon: Warden Fillers, MD;  Location: [facility] OR;  Service: Gynecology;  Laterality: N/A;    Obstetrical History: OB History     Gravida  3   Para  1   Term  1   Preterm  0   AB  1   Living  1      SAB  1   IAB  0   Ectopic  0   Multiple  0    Live Births  1           Social History: Social History   Socioeconomic History   Marital status: Divorced    Spouse name: Not on file   Number of children: 1   Years of education: Not on file   Highest education level: Not on file  Occupational History   Not on file  Tobacco Use   Smoking status: Former    Current packs/day: 0.25    Average packs/day: 0.3 packs/day for 0.2 years (0.1 ttl pk-yrs)    Types: Cigarettes   Smokeless tobacco: Never  Vaping Use   Vaping status: Every Day  Substance and Sexual Activity   Alcohol use: Not Currently    Comment: occ   Drug use: No   Sexual activity: Yes    Birth control/protection: None  Other Topics Concern   Not on file  Social History Narrative   Not on file   Social Determinants of Health   Financial Resource Strain: Medium Risk (02/18/2022)   Overall Financial Resource Strain (CARDIA)    Difficulty of Paying Living  Expenses: Somewhat hard  Food Insecurity: No Food Insecurity (03/31/2023)   Hunger Vital Sign    Worried About Running Out of Food in the Last Year: Never true    Ran Out of Food in the Last Year: Never true  Transportation Needs: No Transportation Needs (03/31/2023)   PRAPARE - Administrator, Civil Service (Medical): No    Lack of Transportation (Non-Medical): No  Physical Activity: Insufficiently Active (02/18/2022)   Exercise Vital Sign    Days of Exercise per Week: 2 days    Minutes of Exercise per Session: 20 min  Stress: Stress Concern Present (02/18/2022)   [employer] of Occupational Health - Occupational Stress Questionnaire    Feeling of Stress : Very much  Social Connections: Socially Isolated (02/18/2022)   Social Connection and Isolation Panel [NHANES]    Frequency of Communication with Friends and Family: Once a week    Frequency of Social Gatherings with Friends and Family: Never    Attends Religious Services: Never    Database administrator or Organizations: No     Attends Engineer, structural: Never    Marital Status: Living with partner    Family History: Family History  Problem Relation Age of Onset   Deafness Mother    Arthritis Mother    Hyperlipidemia Mother    Heart attack Father    Deafness Father    Glaucoma Maternal Grandmother    Hypertension Maternal Grandmother    Anxiety disorder Maternal Grandmother    COPD Maternal Grandfather    GER disease Maternal Grandfather    Dementia Paternal Grandmother    Colon cancer Neg Hx    Pancreatic cancer Neg Hx    Stomach cancer Neg Hx    Esophageal cancer Neg Hx    Liver disease Neg Hx     Allergies: No Known Allergies  (Not in a hospital admission)       Review of Systems   Constitutional: Negative for fever and chills Eyes: Negative for visual disturbances Respiratory: Negative for shortness of breath, dyspnea Cardiovascular: Negative for chest pain or palpitations  Gastrointestinal: Negative for abdominal pain, vomiting, diarrhea and constipation.   Genitourinary: Negative for dysuria and urgency Musculoskeletal: POSITIVE for flank/ back pain, improved  Neurological: Negative for dizziness and headaches      Last menstrual period 07/02/2022. General appearance: alert, cooperative, and no distress Lungs: normal respiratory effort Heart: regular rate and rhythm Abdomen: soft, non-tender; bowel sounds normal Extremities: Homans sign is negative, no sign of DVT DTR's 2+ Presentation: cephalic Fetal monitoring  Baseline: 140 bpm, Variability: Good {> 6 bpm), Accelerations: Reactive, and Decelerations: Absent Uterine activity  infrequent and mild      Prenatal labs:   FAMILY TREE  RESULTS  Language English Pap 02/18/22 LSIL +HPV  Initiated care at 9wks GC/CT Initial:   -/-   36wks:  Dating by LMP c/w 9wk [place]    Support person [employer] NT/IT: declined   AFP: declined     Panorama: declined  BP cuff rx'd Carrier Screen CF carrier  FOB not tested     [place]/Hgb Elec   Rhogam N/a    TDaP vaccine 01/08/23 Blood Type A/Positive/-- (01/18 1609)  Flu vaccine  Antibody Negative (05/02 0828)  Covid vaccine  HBsAg Negative (01/18 1609)    RPR Non Reactive (05/02 0828)  Anatomy US Girl, Rt CPC, resolved @ 25wks Rubella  1.12 (01/18 1609)  Feeding Plan breast HIV Non Reactive (05/02  1610)  Contraception BTL Hep C neg  Circumcision N/a    Pediatrician Possibly  Peds A1C/GTT Early:      26-28wks:normal  Prenatal Classes discussed      GBS Negative/-- (07/11 1400)   [ ]  PCN allergy  BTL Consent 01/30/23    VBAC Consent N/a PHQ9 & GAD7  [ ] New OB  [ ] 28wks   [ ] 36wks  Waterbirth [ ] Class [ ]  36wkCNM visit/consent        Prenatal Transfer Tool  Maternal Diabetes: No Genetic Screening: Declined Maternal Ultrasounds/Referrals: Normal Fetal Ultrasounds or other Referrals:  None Maternal Substance Abuse:  No Significant Maternal Medications:  None Significant Maternal Lab Results: Group B Strep negative    Results for orders placed or performed during the hospital encounter of 03/31/23 (from the past 24 hour(s))  CBC   Collection Time: 03/31/23  1:00 AM  Result Value Ref Range   WBC 11.7 (H) 4.0 - 10.5 K/uL   RBC 3.54 (L) 3.87 - 5.11 MIL/uL   Hemoglobin 9.3 (L) 12.0 - 15.0 g/dL   HCT 96.0 (L) 45.4 - 09.8 %   MCV 82.5 80.0 - 100.0 fL   MCH 26.3 26.0 - 34.0 pg   MCHC 31.8 30.0 - 36.0 g/dL   RDW 11.9 14.7 - 82.9 %   Platelets 191 150 - 400 K/uL   nRBC 0.0 0.0 - 0.2 %  Type and screen [facility]   Collection Time: 03/31/23  1:00 AM  Result Value Ref Range   ABO/RH(D) PENDING    Antibody Screen PENDING    Sample Expiration      04/03/2023,2359 Performed at [facility], [address] [place], [place] [address]     Assessment: Nicole [name] is a 27 y.o. [phone] with an IUP at [redacted]w[redacted]d presenting for IOL for GHTN.  Plan: #Labor: Cytotec->declined Foley->AROM> possibly pitocin #Pain:  Per  request #FWB Cat 1 #ID: GBS: neg     Jacklyn Shell 03/31/2023, 1:41 AM

## 2023-03-31 ENCOUNTER — Other Ambulatory Visit: Payer: Self-pay

## 2023-03-31 ENCOUNTER — Inpatient Hospital Stay (HOSPITAL_COMMUNITY): Payer: Medicaid Other | Admitting: Anesthesiology

## 2023-03-31 ENCOUNTER — Encounter (HOSPITAL_COMMUNITY): Payer: Self-pay | Admitting: Family Medicine

## 2023-03-31 ENCOUNTER — Inpatient Hospital Stay (HOSPITAL_COMMUNITY): Payer: Medicaid Other

## 2023-03-31 ENCOUNTER — Inpatient Hospital Stay (HOSPITAL_COMMUNITY)
Admission: RE | Admit: 2023-03-31 | Discharge: 2023-04-01 | DRG: 807 | Disposition: A | Discharge status: Home / Self Care | Payer: Medicaid Other | Attending: Family Medicine | Admitting: Family Medicine

## 2023-03-31 DIAGNOSIS — O134 Gestational [pregnancy-induced] hypertension without significant proteinuria, complicating childbirth: Principal | ICD-10-CM | POA: Diagnosis present

## 2023-03-31 DIAGNOSIS — Z3A38 38 weeks gestation of pregnancy: Secondary | ICD-10-CM | POA: Diagnosis not present

## 2023-03-31 DIAGNOSIS — O326XX0 Maternal care for compound presentation, not applicable or unspecified: Secondary | ICD-10-CM | POA: Diagnosis present

## 2023-03-31 DIAGNOSIS — Z5986 Financial insecurity: Secondary | ICD-10-CM | POA: Diagnosis not present

## 2023-03-31 DIAGNOSIS — O139 Gestational [pregnancy-induced] hypertension without significant proteinuria, unspecified trimester: Principal | ICD-10-CM | POA: Diagnosis present

## 2023-03-31 DIAGNOSIS — O9902 Anemia complicating childbirth: Secondary | ICD-10-CM | POA: Diagnosis present

## 2023-03-31 DIAGNOSIS — Z87891 Personal history of nicotine dependence: Secondary | ICD-10-CM

## 2023-03-31 DIAGNOSIS — O99214 Obesity complicating childbirth: Secondary | ICD-10-CM | POA: Diagnosis present

## 2023-03-31 DIAGNOSIS — O133 Gestational [pregnancy-induced] hypertension without significant proteinuria, third trimester: Secondary | ICD-10-CM

## 2023-03-31 LAB — CBC
HCT: 29.2 % — ABNORMAL LOW (ref 36.0–46.0)
HCT: 30.8 % — ABNORMAL LOW (ref 36.0–46.0)
Hemoglobin: 9.3 g/dL — ABNORMAL LOW (ref 12.0–15.0)
Hemoglobin: 9.9 g/dL — ABNORMAL LOW (ref 12.0–15.0)
MCH: 26.3 pg (ref 26.0–34.0)
MCH: 26.3 pg (ref 26.0–34.0)
MCHC: 31.8 g/dL (ref 30.0–36.0)
MCHC: 32.1 g/dL (ref 30.0–36.0)
MCV: 82.5 fL (ref 80.0–100.0)
MCV: 81.7 fL (ref 80.0–100.0)
Platelets: 191 10*3/uL (ref 150–400)
Platelets: 169 10*3/uL (ref 150–400)
RBC: 3.54 MIL/uL — ABNORMAL LOW (ref 3.87–5.11)
RBC: 3.77 MIL/uL — ABNORMAL LOW (ref 3.87–5.11)
RDW: 13.6 % (ref 11.5–15.5)
RDW: 13.5 % (ref 11.5–15.5)
WBC: 11.7 10*3/uL — ABNORMAL HIGH (ref 4.0–10.5)
WBC: 11.1 10*3/uL — ABNORMAL HIGH (ref 4.0–10.5)
nRBC: 0 % (ref 0.0–0.2)
nRBC: 0 % (ref 0.0–0.2)

## 2023-03-31 LAB — COMPREHENSIVE METABOLIC PANEL
ALT: 26 U/L (ref 0–44)
AST: 31 U/L (ref 15–41)
Albumin: 2.7 g/dL — ABNORMAL LOW (ref 3.5–5.0)
Alkaline Phosphatase: 208 U/L — ABNORMAL HIGH (ref 38–126)
Anion gap: 6 (ref 5–15)
BUN: 8 mg/dL (ref 6–20)
CO2: 20 mmol/L — ABNORMAL LOW (ref 22–32)
Calcium: 8.6 mg/dL — ABNORMAL LOW (ref 8.9–10.3)
Chloride: 107 mmol/L (ref 98–111)
Creatinine, Ser: 0.7 mg/dL (ref 0.44–1.00)
GFR, Estimated: >60 mL/min (ref >60)
Glucose, Bld: 80 mg/dL (ref 70–99)
Potassium: 3.5 mmol/L (ref 3.5–5.1)
Sodium: 133 mmol/L — ABNORMAL LOW (ref 135–145)
Total Bilirubin: 0.2 mg/dL — ABNORMAL LOW (ref 0.3–1.2)
Total Protein: 5.8 g/dL — ABNORMAL LOW (ref 6.5–8.1)

## 2023-03-31 LAB — TYPE AND SCREEN
ABO/RH(D): A POS
Antibody Screen: NEGATIVE

## 2023-03-31 LAB — PROTEIN / CREATININE RATIO, URINE
Creatinine, Urine: 45 mg/dL
Total Protein, Urine: <6 mg/dL

## 2023-03-31 LAB — RPR: RPR Ser Ql: NONREACTIVE

## 2023-03-31 MED ORDER — OXYTOCIN BOLUS FROM INFUSION
333.0000 mL | Freq: Once | INTRAVENOUS | Status: AC
  Administered 2023-03-31: 333 mL via INTRAVENOUS

## 2023-03-31 MED ORDER — SERTRALINE HCL 25 MG PO TABS: 25.0000 mg | ORAL_TABLET | Freq: Every day | ORAL | Status: DC

## 2023-03-31 MED ORDER — TERBUTALINE SULFATE 1 MG/ML IJ SOLN: 0.2500 mg | Freq: Once | INTRAMUSCULAR | Status: DC | PRN

## 2023-03-31 MED ORDER — SERTRALINE HCL 50 MG PO TABS
75.0000 mg | ORAL_TABLET | Freq: Every day | ORAL | Status: DC
  Filled 2023-03-31: qty 2

## 2023-03-31 MED ORDER — MISOPROSTOL 25 MCG QUARTER TABLET
25.0000 ug | ORAL_TABLET | ORAL | Status: DC
  Administered 2023-03-31: 25 ug via VAGINAL

## 2023-03-31 MED ORDER — LIDOCAINE HCL (PF) 1 % IJ SOLN: 30.0000 mL | INTRAMUSCULAR | Status: DC | PRN

## 2023-03-31 MED ORDER — PRENATAL MULTIVITAMIN CH
1.0000 | ORAL_TABLET | Freq: Every day | ORAL | Status: DC
  Administered 2023-03-31 – 2023-04-01 (×2): 1 via ORAL
  Filled 2023-03-31 (×2): qty 1

## 2023-03-31 MED ORDER — BUPIVACAINE HCL (PF) 0.25 % IJ SOLN
INTRAMUSCULAR | Status: DC | PRN
  Administered 2023-03-31: 1.2 mL via INTRATHECAL

## 2023-03-31 MED ORDER — TETANUS-DIPHTH-ACELL PERTUSSIS 5-2.5-18.5 LF-MCG/0.5 IM SUSY: 0.5000 mL | PREFILLED_SYRINGE | Freq: Once | INTRAMUSCULAR | Status: DC

## 2023-03-31 MED ORDER — IBUPROFEN 600 MG PO TABS
600.0000 mg | ORAL_TABLET | Freq: Four times a day (QID) | ORAL | Status: DC
  Administered 2023-03-31 – 2023-04-01 (×5): 600 mg via ORAL
  Filled 2023-03-31 (×5): qty 1

## 2023-03-31 MED ORDER — MISOPROSTOL 25 MCG QUARTER TABLET
25.0000 ug | ORAL_TABLET | Freq: Once | ORAL | Status: AC
  Administered 2023-03-31: 25 ug via VAGINAL
  Filled 2023-03-31: qty 1

## 2023-03-31 MED ORDER — FLEET ENEMA 7-19 GM/118ML RE ENEM: 1.0000 | ENEMA | RECTAL | Status: DC | PRN

## 2023-03-31 MED ORDER — FENTANYL CITRATE (PF) 100 MCG/2ML IJ SOLN
100.0000 ug | INTRAMUSCULAR | Status: DC | PRN
  Administered 2023-03-31: 100 ug via INTRAVENOUS
  Filled 2023-03-31 (×2): qty 2

## 2023-03-31 MED ORDER — DIPHENHYDRAMINE HCL 25 MG PO CAPS: 25.0000 mg | ORAL_CAPSULE | Freq: Four times a day (QID) | ORAL | Status: DC | PRN

## 2023-03-31 MED ORDER — ZOLPIDEM TARTRATE 5 MG PO TABS
5.0000 mg | ORAL_TABLET | Freq: Every evening | ORAL | Status: DC | PRN
  Filled 2023-03-31: qty 1

## 2023-03-31 MED ORDER — FENTANYL-BUPIVACAINE-NACL 0.5-0.125-0.9 MG/250ML-% EP SOLN
12.0000 mL/h | EPIDURAL | Status: DC | PRN
  Filled 2023-03-31: qty 250

## 2023-03-31 MED ORDER — PHENYLEPHRINE 80 MCG/ML (10ML) SYRINGE FOR IV PUSH (FOR BLOOD PRESSURE SUPPORT): 80.0000 ug | PREFILLED_SYRINGE | INTRAVENOUS | Status: DC | PRN

## 2023-03-31 MED ORDER — WITCH HAZEL-GLYCERIN EX PADS: 1.0000 | MEDICATED_PAD | CUTANEOUS | Status: DC | PRN

## 2023-03-31 MED ORDER — MEASLES, MUMPS & RUBELLA VAC IJ SOLR: 0.5000 mL | Freq: Once | INTRAMUSCULAR | Status: DC

## 2023-03-31 MED ORDER — ACETAMINOPHEN 325 MG PO TABS: 650.0000 mg | ORAL_TABLET | ORAL | Status: DC | PRN

## 2023-03-31 MED ORDER — SENNOSIDES-DOCUSATE SODIUM 8.6-50 MG PO TABS
2.0000 | ORAL_TABLET | Freq: Every day | ORAL | Status: DC
  Administered 2023-04-01: 2 via ORAL
  Filled 2023-03-31: qty 2

## 2023-03-31 MED ORDER — MISOPROSTOL 25 MCG QUARTER TABLET
25.0000 ug | ORAL_TABLET | ORAL | Status: DC
  Filled 2023-03-31: qty 1

## 2023-03-31 MED ORDER — CYCLOBENZAPRINE HCL 5 MG PO TABS
10.0000 mg | ORAL_TABLET | Freq: Once | ORAL | Status: AC
  Administered 2023-03-31: 10 mg via ORAL
  Filled 2023-03-31: qty 2

## 2023-03-31 MED ORDER — EPHEDRINE 5 MG/ML INJ: 10.0000 mg | INTRAVENOUS | Status: DC | PRN

## 2023-03-31 MED ORDER — OXYCODONE-ACETAMINOPHEN 5-325 MG PO TABS: 2.0000 | ORAL_TABLET | ORAL | Status: DC | PRN

## 2023-03-31 MED ORDER — LACTATED RINGERS IV SOLN
500.0000 mL | Freq: Once | INTRAVENOUS | Status: AC
  Administered 2023-03-31: 500 mL via INTRAVENOUS

## 2023-03-31 MED ORDER — HYDROXYZINE HCL 25 MG PO TABS
25.0000 mg | ORAL_TABLET | Freq: Once | ORAL | Status: AC
  Administered 2023-03-31: 25 mg via ORAL
  Filled 2023-03-31: qty 1

## 2023-03-31 MED ORDER — MISOPROSTOL 50MCG HALF TABLET
50.0000 ug | ORAL_TABLET | Freq: Once | ORAL | Status: AC
  Administered 2023-03-31: 50 ug via ORAL
  Filled 2023-03-31: qty 1

## 2023-03-31 MED ORDER — OXYTOCIN-SODIUM CHLORIDE 30-0.9 UT/500ML-% IV SOLN
2.5000 [IU]/h | INTRAVENOUS | Status: DC
  Administered 2023-03-31: 2.5 [IU]/h via INTRAVENOUS
  Filled 2023-03-31: qty 500

## 2023-03-31 MED ORDER — ONDANSETRON HCL 4 MG PO TABS: 4.0000 mg | ORAL_TABLET | ORAL | Status: DC | PRN

## 2023-03-31 MED ORDER — SOD CITRATE-CITRIC ACID 500-334 MG/5ML PO SOLN
30.0000 mL | ORAL | Status: DC | PRN
  Administered 2023-03-31: 30 mL via ORAL
  Filled 2023-03-31: qty 30

## 2023-03-31 MED ORDER — ONDANSETRON HCL 4 MG/2ML IJ SOLN: 4.0000 mg | INTRAMUSCULAR | Status: DC | PRN

## 2023-03-31 MED ORDER — LACTATED RINGERS IV SOLN: INTRAVENOUS | Status: DC

## 2023-03-31 MED ORDER — BENZOCAINE-MENTHOL 20-0.5 % EX AERO
1.0000 | INHALATION_SPRAY | CUTANEOUS | Status: DC | PRN
  Administered 2023-03-31: 1 via TOPICAL
  Filled 2023-03-31: qty 56

## 2023-03-31 MED ORDER — MISOPROSTOL 25 MCG QUARTER TABLET: 25.0000 ug | ORAL_TABLET | ORAL | Status: DC

## 2023-03-31 MED ORDER — FENTANYL-BUPIVACAINE-NACL 0.5-0.125-0.9 MG/250ML-% EP SOLN
EPIDURAL | Status: DC | PRN
  Administered 2023-03-31: 12 mL/h via EPIDURAL

## 2023-03-31 MED ORDER — LACTATED RINGERS IV SOLN
500.0000 mL | INTRAVENOUS | Status: DC | PRN
  Administered 2023-03-31: 500 mL via INTRAVENOUS

## 2023-03-31 MED ORDER — DIBUCAINE (PERIANAL) 1 % EX OINT: 1.0000 | TOPICAL_OINTMENT | CUTANEOUS | Status: DC | PRN

## 2023-03-31 MED ORDER — HYDROXYZINE HCL 50 MG PO TABS: 50.0000 mg | ORAL_TABLET | Freq: Four times a day (QID) | ORAL | Status: DC | PRN

## 2023-03-31 MED ORDER — COCONUT OIL OIL: 1.0000 | TOPICAL_OIL | Status: DC | PRN

## 2023-03-31 MED ORDER — OXYCODONE-ACETAMINOPHEN 5-325 MG PO TABS
1.0000 | ORAL_TABLET | ORAL | Status: DC | PRN
  Administered 2023-03-31: 1 via ORAL
  Filled 2023-03-31: qty 1

## 2023-03-31 MED ORDER — SIMETHICONE 80 MG PO CHEW: 80.0000 mg | CHEWABLE_TABLET | ORAL | Status: DC | PRN

## 2023-03-31 MED ORDER — DIPHENHYDRAMINE HCL 50 MG/ML IJ SOLN
12.5000 mg | INTRAMUSCULAR | Status: DC | PRN
  Administered 2023-03-31: 12.5 mg via INTRAVENOUS
  Filled 2023-03-31: qty 1

## 2023-03-31 MED ORDER — ONDANSETRON HCL 4 MG/2ML IJ SOLN
4.0000 mg | Freq: Four times a day (QID) | INTRAMUSCULAR | Status: DC | PRN
  Administered 2023-03-31: 4 mg via INTRAVENOUS
  Filled 2023-03-31: qty 2

## 2023-03-31 MED ORDER — SERTRALINE HCL 50 MG PO TABS
75.0000 mg | ORAL_TABLET | Freq: Every day | ORAL | Status: DC
  Administered 2023-03-31: 75 mg via ORAL

## 2023-03-31 NOTE — Progress Notes (Signed)
Patient ID: DANAIJA ESKRIDGE, female   DOB: December 20, 1995, 27 y.o.   MRN: [id number]  Delivery Note Zarielle NIMCO BIVENS is a 27 y.o. G3P1011 at [redacted]w[redacted]d admitted for IOL for gHTN.   GBS Status:  Negative/-- (07/11 1400) Maximum Maternal Temperature: 98.5  Labor course: Initial SVE: 1/thick/-2. Augmentation with: Cytotec. She then progressed to complete.  ROM: 03/31/23 @ 0750 with small amount of clear fluid  Birth: At [phone] a viable female was delivered via spontaneous vaginal delivery (Presentation: LOA; compound left hand/arm ). Nuchal cord present: No.  Shoulders and body delivered in usual fashion. Infant placed directly on mom's abdomen for bonding/skin-to-skin, baby dried and stimulated. Cord clamped x 2 after 1 minute and cut by Dad.  Cord blood collected.  The placenta separated spontaneously and delivered via gentle cord traction.  Pitocin infused rapidly IV per protocol.  Fundus firm with massage.  Placenta inspected and appears to be intact with a 3 VC.  Placenta/Cord with the following complications: none.  Cord pH: N/A Sponge and instrument count were correct x2.  Intrapartum complications:  None Anesthesia:  epidural Episiotomy: none Lacerations:  none Suture Repair:  N/A EBL (mL): 386   Infant: APGAR (1 MIN): 9  APGAR (5 MINS): 9  APGAR (10 MINS):    Infant weight: 2850g (6 lbs 4.5 oz)  Mom to postpartum.  Baby to Couplet care / Skin to Skin. Placenta to L&D   Plans to Breastfeed Contraception: tubal ligation Circumcision: N/A  Note sent to [facility]: FT for pp visit.  Karis Juba, SNM 03/31/2023 9:54 AM

## 2023-03-31 NOTE — Discharge Summary (Signed)
Postpartum Discharge Summary  Date of Service updated***     Patient Name: Nicole [name] DOB: 1995-12-31 MRN: [id number]  Date of admission: 03/31/2023 Delivery date:03/31/2023 Delivering provider: Karis Juba Date of discharge: 03/31/2023  Admitting diagnosis: Gestational hypertension [O13.9] Intrauterine pregnancy: [redacted]w[redacted]d     Secondary diagnosis:  Principal Problem:   Gestational hypertension  Additional problems: BTL    Discharge diagnosis: Term Pregnancy Delivered                                              Post partum procedures:postpartum tubal ligation Augmentation: Cytotec Complications: None  Hospital course: Induction of Labor With Vaginal Delivery   27 y.o. yo [id number] at [redacted]w[redacted]d was admitted to the hospital 03/31/2023 for induction of labor.  Indication for induction: Gestational hypertension.  Patient had an labor course complicated by none Membrane Rupture Time/Date: 7:50 AM,03/31/2023  Delivery Method:Vaginal, Spontaneous Episiotomy: None Lacerations:  None Details of delivery can be found in separate delivery note.  Patient had a postpartum course complicated by***. Patient is discharged home 03/31/23.  Newborn Data: Birth date:03/31/2023 Birth time:9:39 AM Gender:Female Living status:Living Apgars:9 ,9  Weight:2850 g - 6 lbs 4.5 oz  Magnesium Sulfate received: No BMZ received: No Rhophylac:N/A MMR:N/A - IMMUNE T-DaP:Given prenatally - 01/08/2023 Flu: No Transfusion:{Transfusion received:30440034}  Physical exam  Vitals:   03/31/23 1040 03/31/23 1105 03/31/23 1115 03/31/23 1218  BP: 126/62 120/77 116/77 117/72  Pulse: 77 68 72 85  Resp:  16 18 18   Temp:   98 F (36.7 C) 97.6 F (36.4 C)  TempSrc:   Oral Oral  SpO2:    99%  Weight:      Height:       General: {Exam; general:21111117} Lochia: {Desc; appropriate/inappropriate:30686::"appropriate"} Uterine Fundus: {Desc; firm/soft:30687} Incision: {Exam; incision:21111123} DVT  Evaluation: {Exam; dvt:2111122} Labs: Lab Results  Component Value Date   WBC 11.1 (H) 03/31/2023   HGB 9.9 (L) 03/31/2023   HCT 30.8 (L) 03/31/2023   MCV 81.7 03/31/2023   PLT 169 03/31/2023      Latest Ref Rng & Units 03/31/2023    1:00 AM  CMP  Glucose 70 - 99 mg/dL 80   BUN 6 - 20 mg/dL 8   Creatinine 1.47 - 8.29 mg/dL 5.62   Sodium 130 - 865 mmol/L 133   Potassium 3.5 - 5.1 mmol/L 3.5   Chloride 98 - 111 mmol/L 107   CO2 22 - 32 mmol/L 20   Calcium 8.9 - 10.3 mg/dL 8.6   Total Protein 6.5 - 8.1 g/dL 5.8   Total Bilirubin 0.3 - 1.2 mg/dL 0.2   Alkaline Phos 38 - 126 U/L 208   AST 15 - 41 U/L 31   ALT 0 - 44 U/L 26    Edinburgh Score:     No data to display           After visit meds:  Allergies as of 03/31/2023   No Known Allergies   Med Rec must be completed prior to using this [facility]***       Discharge home in stable condition Infant Feeding: {Baby feeding:23562} Infant Disposition:{CHL IP OB HOME WITH HQIONG:29528} Discharge instruction: per After Visit Summary and Postpartum booklet. Activity: Advance as tolerated. Pelvic rest for 6 weeks.  Diet: routine diet Future Appointments:No future appointments. Follow up Visit:  Follow-up Information  [facility] for [facility]. Schedule an appointment as soon as possible for a visit in 6 week(s).   Specialty: Obstetrics and Gynecology Why: postpartum visit Contact information: [address] [place] [address] [phone]                Message to FT by R. Arita Miss, CNM on 03/31/2023 Please schedule this patient for a In person postpartum visit in 6 weeks with the following provider: Any provider. Additional Postpartum F/U:Incision check 1 week  High risk pregnancy complicated by: HTN Delivery mode:  Vaginal, Spontaneous Anticipated Birth Control:  BTL done [facility]   03/31/2023 Raelyn Mora, CNM

## 2023-03-31 NOTE — Anesthesia Preprocedure Evaluation (Addendum)
Anesthesia Evaluation  Patient identified by MRN, date of birth, ID band Patient awake    Reviewed: Allergy & Precautions, NPO status , Patient's Chart, lab work & pertinent test results  Airway Mallampati: II  TM Distance: >3 FB Neck ROM: Full    Dental no notable dental hx.    Pulmonary Patient abstained from smoking., former smoker  CF carrier    Pulmonary exam normal breath sounds clear to auscultation       Cardiovascular hypertension (gHTN), Normal cardiovascular exam Rhythm:Regular Rate:Normal     Neuro/Psych  PSYCHIATRIC DISORDERS Anxiety Depression     Vertigo     GI/Hepatic negative GI ROS, Neg liver ROS,,,  Endo/Other   Obesity   Renal/GU negative Renal ROS  negative genitourinary   Musculoskeletal negative musculoskeletal ROS (+)    Abdominal   Peds  (+) ATTENTION DEFICIT DISORDER WITHOUT HYPERACTIVITY and ADHD Hematology  (+) Blood dyscrasia, anemia  Hb 9.9, plt 169   Anesthesia Other Findings   Reproductive/Obstetrics (+) Pregnancy                             Anesthesia Physical Anesthesia Plan  ASA: 2  Anesthesia Plan: Combined Spinal and Epidural   Post-op Pain Management:    Induction:   PONV Risk Score and Plan: 2  Airway Management Planned: Natural Airway  Additional Equipment: None  Intra-op Plan:   Post-operative Plan:   Informed Consent: I have reviewed the patients History and Physical, chart, labs and discussed the procedure including the risks, benefits and alternatives for the proposed anesthesia with the patient or authorized representative who has indicated his/her understanding and acceptance.       Plan Discussed with:   Anesthesia Plan Comments:        Anesthesia Quick Evaluation

## 2023-03-31 NOTE — Progress Notes (Signed)
Obix not working in rm 214. Used paper tracing from 0030 to 0119 and moved pt to rm 206

## 2023-03-31 NOTE — Progress Notes (Signed)
Patient Vitals for the past 4 hrs:  BP Temp Temp src Pulse Resp  03/31/23 0555 124/78 97.9 F (36.6 C) Oral 79 17   FHR Cat 1.  UI in EFM.  Cx 3/60/-2 per RN.  Received 2nd dose of cytotec at 0600 per vagina.  Plan AROM around 1000.

## 2023-03-31 NOTE — Lactation Note (Signed)
This note was copied from a baby's chart. Lactation Consultation Note  Patient Name: Nicole [name] [id number] Date: 03/31/2023 Age:29 hours Reason for consult: Initial assessment;Early term 37-38.6wks Experienced mom BF her 1st child 3 months. Mom stated this baby has been latching well denies painful latches. Mom is please w/BF at this time. Reviewed newborn feeding habits, STS, I&O, supply and demand. Mom encouraged to feed baby 8-12 times/24 hours and with feeding cues.  Encouraged mom if she has any questions or needs assistance to call. Mom stated so far everything is going well.  Maternal Data Does the patient have breastfeeding experience prior to this delivery?: Yes How long did the patient breastfeed?: 3 months  Feeding    LATCH Score                    Lactation Tools Discussed/Used    Interventions    Discharge    Consult Status Consult Status: Follow-up Date: 04/01/23 Follow-up type: In-patient    Charyl Dancer 03/31/2023, 10:29 PM

## 2023-03-31 NOTE — Anesthesia Procedure Notes (Signed)
Epidural Patient location during procedure: OB Start time: 03/31/2023 8:44 AM End time: 03/31/2023 8:53 AM  Staffing Anesthesiologist: Lannie Fields, DO Performed: anesthesiologist   Preanesthetic Checklist Completed: patient identified, IV checked, risks and benefits discussed, monitors and equipment checked, pre-op evaluation and timeout performed  Epidural Patient position: sitting Prep: DuraPrep and site prepped and draped Patient monitoring: continuous pulse ox, blood pressure, heart rate and cardiac monitor Approach: midline Location: L3-L4 Injection technique: LOR air  Needle:  Needle type: Tuohy  Needle gauge: 17 G Needle length: 9 cm Needle insertion depth: 4.5 cm Catheter type: closed end flexible Catheter size: 19 Gauge Catheter at skin depth: 10 cm Test dose: negative  Assessment Sensory level: T8 Events: blood not aspirated, no cerebrospinal fluid, injection not painful, no injection resistance, no paresthesia and negative IV test  Additional Notes Patient identified. Risks/Benefits/Options discussed with patient including but not limited to bleeding, infection, nerve damage, paralysis, failed block, incomplete pain control, headache, blood pressure changes, nausea, vomiting, reactions to medication both or allergic, itching and postpartum back pain. Confirmed with bedside nurse the patient's most recent platelet count. Confirmed with patient that they are not currently taking any anticoagulation, have any bleeding history or any family history of bleeding disorders. Patient expressed understanding and wished to proceed. All questions were answered. Sterile technique was used throughout the entire procedure. Please see nursing notes for vital signs. Test dose was given through epidural catheter and negative prior to continuing to dose epidural or start infusion. Warning signs of high block given to the patient including shortness of breath, tingling/numbness in  hands, complete motor block, or any concerning symptoms with instructions to call for help. Patient was given instructions on fall risk and not to get out of bed. All questions and concerns addressed with instructions to call with any issues or inadequate analgesia.    CSE performed w/ 24G pencan through tuohy, clear CSF, no issues.Reason for block:procedure for pain

## 2023-04-01 ENCOUNTER — Inpatient Hospital Stay (HOSPITAL_COMMUNITY): Payer: Medicaid Other | Admitting: Anesthesiology

## 2023-04-01 ENCOUNTER — Encounter (HOSPITAL_COMMUNITY)
Admission: RE | Disposition: A | Discharge status: Home / Self Care | Payer: Self-pay | Source: Home / Self Care | Attending: Family Medicine

## 2023-04-01 ENCOUNTER — Other Ambulatory Visit (HOSPITAL_COMMUNITY): Payer: Self-pay

## 2023-04-01 SURGERY — LIGATION, FALLOPIAN TUBE, POSTPARTUM
Anesthesia: Choice

## 2023-04-01 MED ORDER — LACTATED RINGERS IV SOLN: INTRAVENOUS | Status: DC

## 2023-04-01 MED ORDER — FAMOTIDINE 20 MG PO TABS
40.0000 mg | ORAL_TABLET | Freq: Once | ORAL | Status: AC
  Administered 2023-04-01: 40 mg via ORAL
  Filled 2023-04-01: qty 2

## 2023-04-01 MED ORDER — FERROUS SULFATE 325 (65 FE) MG PO TABS
325.0000 mg | ORAL_TABLET | ORAL | 2 refills | Status: AC
  Filled 2023-04-01: qty 100, 200d supply, fill #0

## 2023-04-01 MED ORDER — METOCLOPRAMIDE HCL 10 MG PO TABS
10.0000 mg | ORAL_TABLET | Freq: Once | ORAL | Status: AC
  Administered 2023-04-01: 10 mg via ORAL
  Filled 2023-04-01: qty 1

## 2023-04-01 NOTE — Progress Notes (Addendum)
POSTPARTUM PROGRESS NOTE  Post Partum Day 1  Subjective:  Nicole [name] is a 27 y.o. [id number] s/p IOL vaginal delivery at [redacted]w[redacted]d due to gHTN.  She reports she is doing well. No acute events overnight. She denies any problems with ambulating, voiding or po intake. Denies nausea or vomiting. Some back discomfort, but pain is well controlled with ibuprofen per patient. Lochia is less than menstrual period and improving.  Objective: Blood pressure 119/83, pulse 90, temperature 98.2 F (36.8 C), temperature source Oral, resp. rate 18, height 5\' 7"  (1.702 m), weight 89.4 kg, last menstrual period 07/02/2022, SpO2 99%, unknown if currently breastfeeding.  Physical Exam:  General: alert, cooperative and no distress Chest: no respiratory distress Heart:regular rate, distal pulses intact Uterine Fundus: firm, appropriately tender DVT Evaluation: No calf swelling or tenderness Skin: warm, dry  Recent Labs    03/31/23 0100 03/31/23 0805  HGB 9.3* 9.9*  HCT 29.2* 30.8*    Assessment/Plan: Nicole [name] is a 27 y.o. [id number] s/p IOL vaginal delivery at [redacted]w[redacted]d due to gHTN. Patient is doing well and is comfortable. She has no questions or requests. Vitals stable.  PPD#1 - Doing well  Routine postpartum care Anemia: Patient's HGB was 9.9 yesterday. Will plan for oral iron every other day. Contraception: Scheduled for bilateral tubal ligation at 1000 this morning. She has consented to this. Feeding: breast, saw lactation consultant already Dispo: Plan for discharge tomorrow.   LOS: 1 day   Kayleen Memos, Medical Student OB Fellow  04/01/2023, 7:51 AM

## 2023-04-01 NOTE — Anesthesia Preprocedure Evaluation (Signed)
Anesthesia Evaluation    Reviewed: Allergy & Precautions, Patient's Chart, lab work & pertinent test results  Airway        Dental   Pulmonary Patient abstained from smoking., former smoker  CF carrier           Cardiovascular hypertension (gHTN),      Neuro/Psych  PSYCHIATRIC DISORDERS Anxiety Depression     Vertigo     GI/Hepatic negative GI ROS, Neg liver ROS,,,  Endo/Other   Obesity   Renal/GU negative Renal ROS  negative genitourinary   Musculoskeletal negative musculoskeletal ROS (+)    Abdominal   Peds  (+) ATTENTION DEFICIT DISORDER WITHOUT HYPERACTIVITY and ADHD Hematology  (+) Blood dyscrasia, anemia  Hb 9.9, plt 169   Anesthesia Other Findings   Reproductive/Obstetrics                              Anesthesia Physical Anesthesia Plan  ASA: 2  Anesthesia Plan: Spinal   Post-op Pain Management: Tylenol PO (pre-op)*   Induction:   PONV Risk Score and Plan: 2 and Treatment may vary due to age or medical condition, Ondansetron and Scopolamine patch - Pre-op  Airway Management Planned: Natural Airway  Additional Equipment: None  Intra-op Plan:   Post-operative Plan:   Informed Consent:   Plan Discussed with: CRNA and Anesthesiologist  Anesthesia Plan Comments:         Anesthesia Quick Evaluation

## 2023-04-01 NOTE — Anesthesia Postprocedure Evaluation (Signed)
Anesthesia Post Note  Patient: Nicole [name]  Procedure(s) Performed: Labor Epidural     Patient location during evaluation: Mother Baby Anesthesia Type: Epidural Level of consciousness: awake and alert and oriented Pain management: satisfactory to patient Vital Signs Assessment: post-procedure vital signs reviewed and stable Respiratory status: respiratory function stable Cardiovascular status: stable Postop Assessment: no headache, no backache, epidural receding, patient able to bend at knees, no signs of nausea or vomiting, adequate PO intake and able to ambulate Anesthetic complications: no   No notable events documented.  Last Vitals:  Vitals:   04/01/23 0134 04/01/23 0504  BP: 109/62 119/83  Pulse: 81 90  Resp: 16 18  Temp: 36.8 C 36.8 C  SpO2: 100% 99%    Last Pain:  Vitals:   04/01/23 0720  TempSrc:   PainSc: Asleep   Pain Goal:                   Karleen Dolphin

## 2023-04-01 NOTE — Lactation Note (Signed)
This note was copied from a baby's chart. Lactation Consultation Note  Patient Name: Girl Meilyn Heindl [id number] Date: 04/01/2023 Age:29 hours  Reason for consult: Follow-up assessment;Early term 37-38.6wks  P2, [redacted]w[redacted]d, 5% weight loss  Baby has been exclusively breast fed. She reports baby is latching well and cluster fed during the night. She feels her breast are heavier, increase colostrum and nipples are tender but tolerable.   Mother has a history of breastfeeding for 3 months and her milk came in well. Mother denies any questions or concerns. Mom made aware of O/P services, breastfeeding support groups, and our phone # for post-discharge questions.      Feeding Mother's Current Feeding Choice: Breast Milk and Formula     Interventions Interventions: Education;LC Services brochure  Discharge Discharge Education: Engorgement and breast care;Warning signs for feeding baby Pump: DEBP;Personal  Consult Status Consult Status: Complete Date: 04/01/23    Omar Person 04/01/2023, 11:21 AM

## 2023-04-01 NOTE — Progress Notes (Signed)
CSW received consult for ADD and ADHD at this time, this does not warrant CSW involvement as they are not mood disorders.   MOB was referred for history of depression/anxiety.  * Referral screened out by Clinical Social Worker because none of the following criteria appear to apply:  ~ History of anxiety/depression during this pregnancy, or of post-partum depression following prior delivery.  ~ Diagnosis of anxiety and/or depression within last 3 years  OR  * MOB's symptoms currently being treated with medication and/or therapy.  Per OB notes, MOB is currently prescribed Zoloft 50mg  for support of symptoms.  Please contact the Clinical Social Worker if needs arise, by [facility] request, or if MOB scores greater than 9/yes to question 10 on Edinburgh Postpartum Depression Screen.  Enos Fling, Theresia Majors Clinical Social Worker [phone]

## 2023-04-02 ENCOUNTER — Encounter: Payer: Medicaid Other | Admitting: Advanced Practice Midwife

## 2023-04-06 ENCOUNTER — Encounter: Payer: Medicaid Other | Admitting: Family Medicine

## 2023-04-08 ENCOUNTER — Encounter: Payer: Medicaid Other | Admitting: Advanced Practice Midwife

## 2023-04-08 ENCOUNTER — Telehealth: Payer: Medicaid Other

## 2023-04-09 ENCOUNTER — Encounter: Payer: Medicaid Other | Admitting: Advanced Practice Midwife

## 2023-04-20 ENCOUNTER — Ambulatory Visit: Payer: Medicaid Other | Admitting: Obstetrics & Gynecology

## 2023-04-27 ENCOUNTER — Telehealth (HOSPITAL_COMMUNITY): Payer: Self-pay | Admitting: *Deleted

## 2023-04-27 NOTE — Telephone Encounter (Signed)
04/27/2023  Name: Nicole [name] MRN: [id number] DOB: [birth year]  Reason for Call:  Transition of Care Hospital Discharge Call  Contact Status: Patient Contact Status: Message  Language assistant needed:          Follow-Up Questions:    Inocente Salles Postnatal Depression Scale:  In the Past 7 Days:    PHQ2-9 Depression Scale:     Discharge Follow-up:    Post-discharge interventions: NA  Salena Saner, RN 04/27/2023 16:43

## 2023-05-13 ENCOUNTER — Other Ambulatory Visit (HOSPITAL_COMMUNITY)
Admission: RE | Admit: 2023-05-13 | Discharge: 2023-05-13 | Disposition: A | Discharge status: Home / Self Care | Payer: Medicaid Other | Source: Ambulatory Visit | Attending: Advanced Practice Midwife | Admitting: Advanced Practice Midwife

## 2023-05-13 ENCOUNTER — Encounter: Payer: Self-pay | Admitting: Obstetrics & Gynecology

## 2023-05-13 ENCOUNTER — Ambulatory Visit: Payer: Medicaid Other | Admitting: Obstetrics & Gynecology

## 2023-05-13 ENCOUNTER — Ambulatory Visit: Payer: Medicaid Other | Admitting: Advanced Practice Midwife

## 2023-05-13 VITALS — BP 113/83 | HR 104 | Ht 66.0 in | Wt 174.2 lb

## 2023-05-13 DIAGNOSIS — Z124 Encounter for screening for malignant neoplasm of cervix: Secondary | ICD-10-CM | POA: Insufficient documentation

## 2023-05-13 DIAGNOSIS — Z30011 Encounter for initial prescription of contraceptive pills: Secondary | ICD-10-CM

## 2023-05-13 DIAGNOSIS — F331 Major depressive disorder, recurrent, moderate: Secondary | ICD-10-CM | POA: Diagnosis not present

## 2023-05-13 MED ORDER — NORETHIN ACE-ETH ESTRAD-FE 1-20 MG-MCG PO TABS: 1.0000 | ORAL_TABLET | Freq: Every day | ORAL | 11 refills | Status: AC

## 2023-05-13 MED ORDER — SERTRALINE HCL 50 MG PO TABS: 50.0000 mg | ORAL_TABLET | Freq: Every day | ORAL | 3 refills | Status: AC

## 2023-05-13 NOTE — Progress Notes (Signed)
POSTPARTUM VISIT Patient name: Nicole [name] MRN [id number]  Date of birth: 02-23-1996 Chief Complaint:   Postpartum Care  History of Present Illness:   Nicole [name] is a 27 y.o. [phone] female being seen today for a postpartum visit. She is 6 weeks postpartum following a spontaneous vaginal delivery at [redacted]w[redacted]d gestational weeks. IOL: Yes, for Gestational hypertension.    Pregnancy complicated by Gestational HTN .  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  Bleeding no bleeding. Bowel function is normal. Bladder function is normal. Urinary incontinence? No, fecal incontinence? No Patient is sexually active since delivery.  Currently using pull out.   Desired contraception: Plans Interval BTL. Patient does not want a pregnancy in the future.   Desired family size is 2 children.   Upstream - 05/13/23 1020       Pregnancy Intention Screening   Does the patient want to become pregnant in the next year? No    Does the patient's partner want to become pregnant in the next year? No    Would the patient like to discuss contraceptive options today? No      Contraception Wrap Up   Current Method No Contraceptive Precautions    End Method Female Sterilization    Contraception Counseling Provided Yes            The pregnancy intention screening data noted above was reviewed. Potential methods of contraception were discussed. The patient elected to proceed with Female Sterilization.  Edinburgh Postpartum Depression Screening: Positive 15 Noticed that this is an issue when it starts impacting her relationship with others.  Notes a negative mindset and arguing with her partner more than usual.  Notes impacting eating and feeling more tired than normal.  Typically would "sleep it off" but feels like she can't.     Edinburgh Postnatal Depression Scale - 05/13/23 1018       Edinburgh Postnatal Depression Scale:  In the Past 7  Days   I have been able to laugh and see the funny side of things. 1    I have looked forward with enjoyment to things. 3    I have blamed myself unnecessarily when things went wrong. 3    I have been anxious or worried for no good reason. 3    I have felt scared or panicky for no good reason. 0    Things have been getting on top of me. 2    I have been so unhappy that I have had difficulty sleeping. 0    I have felt sad or miserable. 2    I have been so unhappy that I have been crying. 1    The thought of harming myself has occurred to me. 0    Edinburgh Postnatal Depression Scale Total 15             Baby's course has been uncomplicated. Baby is feeding by bottle. Infant has a pediatrician/family doctor? Yes.  Childcare strategy if returning to work/school: patient's mother is helping out.  Pt has material needs met for her and baby: yes  Review of Systems:   Pertinent items are noted in HPI Denies Abnormal vaginal discharge w/ itching/odor/irritation, headaches, visual changes, shortness of breath, chest pain, abdominal pain, severe nausea/vomiting, or problems with urination or bowel movements. Pertinent History Reviewed:  Reviewed past medical,surgical, obstetrical and family history.  Reviewed problem list, medications and allergies. OB History  [place]  Para Term Preterm AB Living  3 2 2  0 1 2  SAB IAB Ectopic Multiple Live Births  1 0 0 0 2    # Outcome Date GA Lbr Len/2nd Weight Sex Type Anes PTL Lv  3 Term 03/31/23 [redacted]w[redacted]d / 00:14 6 lb 4.5 oz (2.85 kg) F Vag-Spont EPI  LIV     Birth Comments: WNL  2 SAB 2023          1 Term 04/29/16 [redacted]w[redacted]d 14:05 / 01:25 6 lb 13.4 oz (3.1 kg) F Vag-Spont EPI N LIV     Birth Comments: None noted   Physical Assessment:   Vitals:   05/13/23 1016  BP: 113/83  Pulse: (!) 104  Weight: 174 lb 3.2 oz (79 kg)  Height: 5\' 6"  (1.676 m)  Body mass index is 28.12 kg/m.       Physical Examination:   General appearance: alert, well  appearing, and in no distress  Mental status: normal mood, behavior, speech, dress, motor activity, and thought processes  Skin: warm & dry   Cardiovascular: RRR  Respiratory: CTAB  Breasts: no masses or abnormalities noted   Abdomen: soft, non-tender   Pelvic: normal external genitalia, vulva, vagina, cervix, uterus and adnexa.  Pap collected  Extremities: no edema  Chaperone: Faith Rogue          Assessment & Plan:  1) Postpartum exam 2) 6 wks s/p spontaneous vaginal delivery 3) bottle feeding 4) Depression screening - elevated.  Discussion with patient regarding management and encouraged pt to restart therapy -previously on zoloft -plan to restart zoloft 50mg  daily (take 1/2 tab for first 1-2wks) -f/u in 3 mos  5) Contraception counseling While she does not want another pregnancy Recognizes that she may not be in the best mindset to make this decision Plan to start on pill for now and may consider another option in the future  Essential components of care per ACOG recommendations: 1. Sexuality, contraception and birth spacing Provided guidance regarding sexuality, management of dyspareunia, and resumption of intercourse Discussed avoiding interpregnancy interval <29mths and recommended birth spacing of 18 months  2. Sleep and fatigue Discussed coping options for fatigue and sleep disruption Encouraged family/partner/community support of 4 hrs of uninterrupted sleep to help with mood and fatigue  3. Physical recovery  Patient is safe to resume physical activity. Discussed attainment of healthy weight.  6.  Chronic disease management Discussed pregnancy complications if any, and their implications for future childbearing and long-term maternal health. Pt had GDM: No.  7. 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    Dispense:  30 tablet    Refill:  3   norethindrone-ethinyl estradiol-FE (JUNEL FE 1/20) 1-20 MG-MCG tablet    Sig: Take 1 tablet by mouth daily.    Dispense:  28 tablet    Refill:  11    Follow-up: Return in about 3 months (around 08/12/2023) for Medication follow up, with Dr. Charlotta Newton.   Myna Hidalgo, DO Attending Obstetrician & Gynecologist, [facility] for [employer], [facility]

## 2023-05-15 LAB — CYTOLOGY - PAP
Adequacy: ABSENT
Chlamydia: NEGATIVE
Comment: NEGATIVE
Comment: NEGATIVE
Comment: NORMAL
Diagnosis: UNDETERMINED — AB
High risk HPV: NEGATIVE
Neisseria Gonorrhea: NEGATIVE

## 2023-06-11 ENCOUNTER — Encounter: Payer: Medicaid Other | Admitting: Obstetrics & Gynecology

## 2024-05-20 ENCOUNTER — Ambulatory Visit: Admitting: Adult Health
